# Patient Record
Sex: Female | Born: 1980 | Race: White | Hispanic: No | Marital: Married | Smoking: Never smoker
Health system: Southern US, Community
[De-identification: ages and names within clinical notes are randomized; demographics above are authoritative.]

## PROBLEM LIST (undated history)

## (undated) DIAGNOSIS — R5383 Other fatigue: Secondary | ICD-10-CM

## (undated) DIAGNOSIS — U071 COVID-19: Secondary | ICD-10-CM

## (undated) DIAGNOSIS — L309 Dermatitis, unspecified: Secondary | ICD-10-CM

## (undated) DIAGNOSIS — E063 Autoimmune thyroiditis: Secondary | ICD-10-CM

## (undated) DIAGNOSIS — T7840XA Allergy, unspecified, initial encounter: Secondary | ICD-10-CM

## (undated) DIAGNOSIS — G43909 Migraine, unspecified, not intractable, without status migrainosus: Secondary | ICD-10-CM

## (undated) HISTORY — DX: Migraine, unspecified, not intractable, without status migrainosus: G43.909

## (undated) HISTORY — PX: BREAST SURGERY: SHX581

## (undated) HISTORY — DX: Other fatigue: R53.83

## (undated) HISTORY — DX: Dermatitis, unspecified: L30.9

## (undated) HISTORY — DX: Allergy, unspecified, initial encounter: T78.40XA

## (undated) HISTORY — DX: COVID-19: U07.1

## (undated) HISTORY — DX: Autoimmune thyroiditis: E06.3

---
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
## 2008-01-17 ENCOUNTER — Inpatient Hospital Stay (HOSPITAL_COMMUNITY): Admission: AD | Admit: 2008-01-17 | Discharge: 2008-01-18 | Payer: Self-pay | Admitting: Obstetrics and Gynecology

## 2008-12-13 HISTORY — PX: MOLE REMOVAL: SHX2046

## 2008-12-13 HISTORY — PX: OTHER SURGICAL HISTORY: SHX169

## 2011-02-03 ENCOUNTER — Ambulatory Visit (INDEPENDENT_AMBULATORY_CARE_PROVIDER_SITE_OTHER): Payer: BC Managed Care – PPO | Admitting: "Endocrinology

## 2011-02-03 DIAGNOSIS — E063 Autoimmune thyroiditis: Secondary | ICD-10-CM

## 2011-02-03 DIAGNOSIS — E038 Other specified hypothyroidism: Secondary | ICD-10-CM

## 2011-02-03 DIAGNOSIS — E049 Nontoxic goiter, unspecified: Secondary | ICD-10-CM

## 2011-04-07 ENCOUNTER — Encounter: Payer: Self-pay | Admitting: *Deleted

## 2011-04-07 ENCOUNTER — Other Ambulatory Visit: Payer: Self-pay | Admitting: *Deleted

## 2011-04-07 DIAGNOSIS — E038 Other specified hypothyroidism: Secondary | ICD-10-CM

## 2011-04-27 NOTE — H&P (Signed)
NAME:  FELISHA, CLAYTOR    ACCOUNT NO.:  [id number]   MEDICAL RECORD NO.:  [id number]          PATIENT TYPE:  MAT   LOCATION:  [facility]                          FACILITY:  [facility]   PHYSICIAN:  Osborn Coho, M.D.   DATE OF BIRTH:  [birth year]   DATE OF ADMISSION:  01/17/2008  DATE OF DISCHARGE:                              HISTORY & PHYSICAL   Ms. Sharline Lehane is a 30 year old married white female, gravida 2,  para 1-0-0-1, at 40-0/7 weeks today, who presents with regular uterine  contractions every 5-8 minutes.  She denies leaking or bleeding.  No  signs or symptoms of PIH.  Her pregnancy has been followed by the  [facility] certified nurse midwife service and has been  remarkable for:   1. History of rapid labor.  2. Migraines.  3. History of hyperemesis.  4. Group B strep positive.   Her prenatal labs were collected on June 20, 2007.  Hemoglobin 12.0,  hematocrit 36.4, platelets 177,000.  Blood type A positive, antibody  negative.  RPR nonreactive.  Rubella immune.  Hepatitis B surface  antigen negative.  HIV nonreactive.  Pap smear within normal limits.  Cystic fibrosis negative.  Parvo titer is negative.  One-hour Glucola  from October 27, 2007, was 125.  RPR at that time was nonreactive.  Hemoglobin at that time was 11.5.  Culture of the vaginal tract for  group B strep on December 26, 2007, was positive.   HISTORY OF PRESENT PREGNANCY:  The patient presented for care at [facility] on June 20, 2007, at 10 weeks' gestation.  She declined first  trimester screen.  Anatomy ultrasound at 19-1/2 weeks' gestation shows  growth consistent with previous dating, confirming [facility] of January 17, 2008.  She has remained size equal to dates and normotensive throughout  the pregnancy.  The rest of her prenatal care was unremarkable.   OBSTETRICAL HISTORY:  She is a gravida 2, para 1-0-0-1.  In December  2006 she had a vaginal delivery of a female infant weighing 7  pounds 6  ounces at 56 weeks' gestation after 6 hours of labor.  She had no  anesthesia.  There were no complications.  Infant's name was [name].  The  second pregnancy is the current pregnancy.   PAST MEDICAL HISTORY:  She is allergic to CODEINE, resulting in  vomiting.   She experienced menarche at the age of 39 with 30-day cycles up to 4-5  days.  She had hyperemesis with her first pregnancy.  She reports having  had the usual childhood illnesses.  She has a history of migraines.   SURGICAL HISTORY:  Remarkable for wisdom teeth extraction in 2000.   FAMILY MEDICAL HISTORY:  Multiple grandparents with heart disease.  Sister and father with chronic hypertension.  Maternal grandmother with  varicosities.  All grandparents with insulin-dependent diabetes.   GENETIC HISTORY:  Negative.   SOCIAL HISTORY:  The patient is married to the father of the baby.  His  name is Best boy.  He is involved and supportive.  They are of the [place]  faith.  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  Father of the baby has 22 years of education and is a Surveyor, minerals  and a Engineer, production.  They deny any alcohol, tobacco or illicit drug  use with the pregnancy.   OBJECTIVE DATA:  VITAL SIGNS:  Stable.  She is afebrile.  HEENT:  Grossly within normal limits.  CHEST:  Clear to auscultation.  HEART:  Regular rate and rhythm.  ABDOMEN:  Gravid in contour with fundal height extending approximately  39 cm from the pubic symphysis.  Fetal heart rate is reactive and  reassuring with occasional variables.  Contractions are every 5-8  minutes lasting 60 seconds.  Cervix is 5 cm, 80% effaced, vertex -2, intact membranes.  She was 4 cm  on February 3 in the office.  EXTREMITIES:  Normal.   ASSESSMENT:  1. Intrauterine pregnancy at term.  2. Early active labor.  3. Group B strep positive.   PLAN:  1. Admit to birthing suite.  2. Routine CNM orders.  3. She declines pain  medication for now.  4. We will begin penicillin for group B strep.  5. Anticipate normal spontaneous vaginal birth.      Cam Hai, C.N.M.      Osborn Coho, M.D.  Electronically Signed    KS/MEDQ  D:  01/17/2008  T:  01/17/2008  Job:  [id number]

## 2011-05-18 ENCOUNTER — Encounter: Payer: Self-pay | Admitting: "Endocrinology

## 2011-05-18 ENCOUNTER — Ambulatory Visit (INDEPENDENT_AMBULATORY_CARE_PROVIDER_SITE_OTHER): Payer: BC Managed Care – PPO | Admitting: "Endocrinology

## 2011-05-18 VITALS — BP 116/67 | HR 76 | Wt 155.2 lb

## 2011-05-18 DIAGNOSIS — E038 Other specified hypothyroidism: Secondary | ICD-10-CM

## 2011-05-18 DIAGNOSIS — G43909 Migraine, unspecified, not intractable, without status migrainosus: Secondary | ICD-10-CM | POA: Insufficient documentation

## 2011-05-18 DIAGNOSIS — R5383 Other fatigue: Secondary | ICD-10-CM | POA: Insufficient documentation

## 2011-05-18 DIAGNOSIS — R231 Pallor: Secondary | ICD-10-CM

## 2011-05-18 DIAGNOSIS — R5381 Other malaise: Secondary | ICD-10-CM

## 2011-05-18 DIAGNOSIS — E063 Autoimmune thyroiditis: Secondary | ICD-10-CM | POA: Insufficient documentation

## 2011-05-18 LAB — CBC
MCHC: 33.7 g/dL (ref 30.0–36.0)
Platelets: 168 10*3/uL (ref 150–400)
RDW: 13.2 % (ref 11.5–15.5)
WBC: 4.6 10*3/uL (ref 4.0–10.5)

## 2011-05-18 LAB — T3, FREE: T3, Free: 2.9 pg/mL (ref 2.3–4.2)

## 2011-05-18 LAB — IRON: Iron: 104 ug/dL (ref 42–145)

## 2011-05-18 NOTE — Progress Notes (Addendum)
CC: FU of hypothyroidism, secondary to Hashimoto's thyroiditis, goiter  HPI: 30 y.o. Caucasian woman 1. Nancy [name] was diagnosed with hypothyroidism about September 2009, 6-7 months after the birth of her second child. She was started on levothyroixine. When I saw her son on 01.06.12 for treatment of his T1DM and hypothyroidism secondary to H.S., I asked her how she was doing. She was cold, tired, and just not feeling well. I changed her to brand Synthroid at the same dose of 50 mcg per day. When her TFTs that day came back low, I increased her dose to 50 mcg on even days and 75 mcg on odd days. OOn 02.22.12 I saw her as a new patient. She felt better on Synthroid, but was still cold. She had a 20+ gram gland. The remainder of her exam was normal. 2. Interim Hx: Even though we had increased her Synthroid to 50 mcg on even days and 75 mcg on odd days, her lab tests on 03.23.12 showed that she was more hypothyroid. I increased her Synthroid to 75 mcg per day on 03.26.12. 3. PROS: Constitutional. The patient feels better. She is well, is healthy overall, and has no significant complaints that pertain to today's visit. Energy: Energy level is pretty good overall. Sleep: The patient usually sleeps well when her mommy duties allow it.. There are no significant complaints of insomnia, frequent awakening, unusual restlessness, or poor sleep quality.  Body temperature: The patient's body temperature seems to be normal overall. She is not cold in 80 degree temperature surroundings. There are no significant problems with being warmer or colder than others in the same environment. Weight: Weight has remained stable. Thereare no significant problems with unusual weight gain or loss. Eyes: The patient's vision is good. There are no signproblems with soreness, bulging, or limited range of eye movements. Neck: The patient is not aware of any problems relating to the anterior neck and thyroid bed. There have  been no significant problems swelling, pain, soreness, tenderness, pressure, discomfort, or difficulty swallowing. Heart: The patient feels the expected increase in heart rate during exercise or other physical activities. There have been no significant problems with palpitations, irregular heart beats, chest pain, or chest pressure. Gastrointestinal: Stomach and intestines seem to be working normally. Bbwel movements are normal. There are no significant complaints of excessive hunger, acid reflux, upset stomach, stomach aches or pains, diarrhea, or constipation. Musculoskeletal: Muscles and extremities appear to be working normally. There are no significant problems with hand tremor, sweaty palms, palmar erythema, or lower leg swelling. Psychological: Mood and psychologicalal responses seem to be normal. There have been no significant problems with sadness, depression, irritability, anger, or inappropriate responses to the actions of others. Mental: The patient has not had any significant problems with abilities to think, to pay attention, to remember, and to make decisions.      GYN: LMP 05.22.12. Regular cycles.  PMFSH:Reviewed. No changes.  ROS: Nancy [name] has no other significant issues involving her other eleven body systems.  PHYSICal exam: BP 116/67  Pulse 76  Wt 155 lb 3.2 oz (70.398 kg) Constitutional: The patient looks healthy and appears physically and emotionally well.  Eyes: There is no arcus or proptosis.  Mouth: The oropharynx appears normal. The tongue appears normal. There is normal oral moisture. There is no obvious gingivitis. Neck: There are no bruits present. The thyroid gland appears normal in size. The thyroid gland is approximately 20+ grams in size. The right lobe is  within normal limits. The left lobe is slightly enlarged, but smaller that no last visit. The consistency of the thyroid gland is normal. There is no thyroid tenderness to palpation. Lungs: The  lungs are clear. Air movement is good. Heart: The heart rhythm and rate appear normal. Heart sounds S1 and S2 are normal. I do not appreciate any pathologic heart murmurs. Abdomen: The abdominal size is normal. Bowel sounds are normal. The abdomen is soft and non-tender. There is no obviously palpable hepatomegaly, splenomegaly, or other masses.  Arms: Muscle mass appears appropriate for age. Hands: There is no obvious tremor. Phalangeal and metacarpophalangeal joints appear normal. Palms are cooll. Mild pallor of the fingers. Legs: Muscle mass appears appropriate for age. There is no edema.  Neurologic: Muscle strength is normal for age and gender  in both the upper and the lower extremities. Muscle tone appears normal. Sensation to touch is normal in the legs and feet.  ASSESSMENT: 1. Hypothyroid: Patient appears better clinically today. She has been losing thyrocytes at about the rate we are replacing Synthroid, or perhaps a little faster. 2. Throiditis: Clinically quiescent, but intermittently active. 3. Fatigue: Better 4. Pallor: May well have iron deficiency and/or be anemic.  Plan:  1. TFTs, CBC, iron 2. Contact patient with results. 3. Increase Synthroid dose as needed. 4. FU in four months.   Level of Service: This visit lasted in excess of 40 minutes. More than 50% of the visit was devoted to counseling.

## 2011-05-18 NOTE — Patient Instructions (Signed)
Please make follow-up appointment in 4 months.

## 2011-09-01 ENCOUNTER — Other Ambulatory Visit: Payer: Self-pay | Admitting: "Endocrinology

## 2011-09-01 ENCOUNTER — Other Ambulatory Visit: Payer: Self-pay | Admitting: *Deleted

## 2011-09-01 DIAGNOSIS — E038 Other specified hypothyroidism: Secondary | ICD-10-CM

## 2011-09-01 LAB — CLIENT PROFILE 3332: Free T4: 1.19 ng/dL (ref 0.80–1.80)

## 2011-09-03 LAB — CBC
Hemoglobin: 12
MCHC: 34.6
MCV: 90.1
Platelets: 144 — ABNORMAL LOW
Platelets: 149 — ABNORMAL LOW
RDW: 14.4
WBC: 14.4 — ABNORMAL HIGH

## 2011-09-20 ENCOUNTER — Ambulatory Visit: Payer: BC Managed Care – PPO | Admitting: "Endocrinology

## 2011-09-22 ENCOUNTER — Encounter: Payer: Self-pay | Admitting: "Endocrinology

## 2011-09-22 ENCOUNTER — Ambulatory Visit (INDEPENDENT_AMBULATORY_CARE_PROVIDER_SITE_OTHER): Payer: Commercial Managed Care - PPO | Admitting: "Endocrinology

## 2011-09-22 VITALS — BP 111/71 | HR 76 | Wt 158.0 lb

## 2011-09-22 DIAGNOSIS — E063 Autoimmune thyroiditis: Secondary | ICD-10-CM

## 2011-09-22 DIAGNOSIS — R231 Pallor: Secondary | ICD-10-CM

## 2011-09-22 DIAGNOSIS — E038 Other specified hypothyroidism: Secondary | ICD-10-CM

## 2011-09-22 DIAGNOSIS — E049 Nontoxic goiter, unspecified: Secondary | ICD-10-CM

## 2011-09-22 NOTE — Progress Notes (Addendum)
CC: FU of hypothyroidism, secondary to Hashimoto's thyroiditis  HPI: 30 y.o. Caucasian woman 1. [name] was diagnosed with hypothyroidism due to Hashimoto's Thyroiditis about September 2009, 6-7 months after the birth of her second child. She was started on levothyroxine. On 01/14/11 I saw her as a new patient. She felt better on Synthroid, but was still cold. She had a 20+ gram gland. The remainder of her exam was normal. 2. Interim Hx:We have gradually increased her dose of Synthroid from 50 mcg/day to 75 mcg/day. Her symptoms of coldness and fatigue have resolved. Her last clinic visit was on 05/18/11. In the interim she has felt well.  3. PROS: Constitutional. The patient feels good. She is well, is healthy overall, and has no significant complaints that pertain to today's visit. Energy: Energy level is pretty good overall. Sleep: The patient usually sleeps well. There are no significant complaints of insomnia, frequent awakening, unusual restlessness, or poor sleep quality.  Body temperature: The patient's body temperature seems to be normal overall. She is not cold in 80 degree temperature surroundings. There are no significant problems with being warmer or colder than others in the same environment. Weight: Weight has remained stable. There are no significant problems with unusual weight gain or loss. Eyes: The patient's vision is good. There are no significant problems with soreness, bulging, or limited range of eye movements. Neck: The patient is not aware of any problems relating to the anterior neck and thyroid bed. There have been no significant problems swelling, pain, soreness, tenderness, pressure, discomfort, or difficulty swallowing. Heart: The patient feels the expected increase in heart rate during exercise or other physical activities. There have been no significant problems with palpitations, irregular heart beats, chest pain, or chest pressure. Gastrointestinal:  Stomach and intestines seem to be working normally. Bowel movements are normal. There are no significant complaints of excessive hunger, acid reflux, upset stomach, stomach aches or pains, diarrhea, or constipation. Musculoskeletal: Muscles and extremities appear to be working normally. There are no significant problems with hand tremor, sweaty palms, palmar erythema, or lower leg swelling. Psychological: Mood and psychological responses seem to be normal. There have been no significant problems with sadness, depression, irritability, anger, or inappropriate responses to the actions of others. Mental: The patient has not had any significant problems with abilities to think, to pay attention, to remember, and to make decisions.      GYN: LMP 09/19/11. She is having regular cycles.  PMFSH: 1. Family and Work: She is still at home taking care of her children. She is now actively looking for work. 2. Activities: No formal exercise program. 3. Tobacco, alcohol, and drugs: None 4. PCP: Dr. [name]  ROS: [name] has no other significant issues involving her other body systems.  PHYSICAL EXAM: BP 111/71  Pulse 76  Wt 158 lb (71.668 kg) Gain of 3 pounds Constitutional: The patient looks healthy and appears physically and emotionally well.  Eyes: There is no arcus or proptosis.  Mouth: The oropharynx appears normal. The tongue appears normal. There is normal oral moisture. There is no obvious gingivitis. Neck: There are no bruits present. The thyroid gland appears normal in size. The thyroid gland is approximately 20+ grams in size. The right lobe is within normal limits. The left lobe is slightly enlarged, essentially unchanged from last visit. The consistency of the thyroid gland is normal. There is no thyroid tenderness to palpation. Lungs: The lungs are clear. Air movement is good.  Heart: The heart rhythm and rate appear normal. Heart sounds S1 and S2 are normal. I do not  appreciate any pathologic heart murmurs. Abdomen: The abdominal size is normal. Bowel sounds are normal. The abdomen is soft and non-tender. There is no obviously palpable hepatomegaly, splenomegaly, or other masses.  Arms: Muscle mass appears appropriate for age.  Hands: There is no obvious tremor. Phalangeal and metacarpophalangeal joints appear normal. Palms are cool. Mild pallor of the fingers, but resolves with flexing of the fingers. Legs: Muscle mass appears appropriate for age. There is no edema.  Neurologic: Muscle strength is normal for age and gender  in both the upper and the lower extremities. Muscle tone appears normal. Sensation to touch is normal in the legs.  ASSESSMENT: 1. Hypothyroid: Patient appears better clinically today. She has been losing thyrocytes at about the rate we are replacing Synthroid, or perhaps a little faster. 2. Thyroiditis: Clinically quiescent, but intermittently active. 3. Goiter: Essentially unchanged from last visit. 4. Pallor: This appears to be more of a positional issue, not anemia per se.  Plan:  1. Diagnostic: TFTs in 3 and 6 months. 2. Therapeutic: Continue current dose of synthroid. 3. Patient education: We discussed the fact that she is gradually losing thyroid cells over time. Therefore we will need to check her TFTs every 3-6 months and increase the dose of Synthroid as needed. 4. Follow-up: FU in four months.   Level of Service: This visit lasted in excess of 40 minutes. More than 50% of the visit was devoted to counseling.

## 2011-09-22 NOTE — Patient Instructions (Signed)
Followup visit in 6 months. Please have a thyroid test repeated in early January and again about one week prior to next appointment.

## 2011-12-02 ENCOUNTER — Encounter (HOSPITAL_COMMUNITY): Payer: Self-pay | Admitting: Emergency Medicine

## 2011-12-02 ENCOUNTER — Emergency Department (HOSPITAL_COMMUNITY)
Admission: EM | Admit: 2011-12-02 | Discharge: 2011-12-02 | Disposition: A | Discharge status: Home / Self Care | Payer: 59 | Source: Home / Self Care | Attending: Family Medicine | Admitting: Family Medicine

## 2011-12-02 ENCOUNTER — Emergency Department (INDEPENDENT_AMBULATORY_CARE_PROVIDER_SITE_OTHER): Payer: 59

## 2011-12-02 DIAGNOSIS — M940 Chondrocostal junction syndrome [Tietze]: Secondary | ICD-10-CM

## 2011-12-02 MED ORDER — ONDANSETRON HCL 4 MG PO TABS: 4.0000 mg | ORAL_TABLET | Freq: Three times a day (TID) | ORAL | Status: AC | PRN

## 2011-12-02 MED ORDER — HYDROCODONE-ACETAMINOPHEN 5-325 MG PO TABS: ORAL_TABLET | ORAL | Status: AC

## 2011-12-02 NOTE — ED Provider Notes (Signed)
History     CSN: [id number]  Arrival date & time 12/02/11  1100   First MD Initiated Contact with Patient 12/02/11 1331      Chief Complaint  Patient presents with  . Flank Pain  . Shortness of Breath    (Consider location/radiation/quality/duration/timing/severity/associated sxs/prior treatment) HPI Comments: Nancy [name] presents for evaluation of LEFT sided posterior rib pain that started last evening after a large coughing spell. She reports coughing over the last few weeks and was evaluated at onset and dx with sinus infection/URI. Coughing has persisted. She reports a large cough last night and immediate pain, to where she now has difficulty taking deep breaths.   Patient is a 30 y.o. female presenting with chest pain. The history is provided by the patient and the spouse.  Chest Pain The chest pain began 1 - 2 weeks ago. Chest pain occurs constantly. The chest pain is unchanged. The pain is associated with breathing, coughing, lifting and exertion. The quality of the pain is described as sharp and pleuritic. The pain does not radiate. Chest pain is worsened by deep breathing and exertion. Primary symptoms include cough. Pertinent negatives for primary symptoms include no fever, no shortness of breath and no wheezing.     Past Medical History  Diagnosis Date  . Hypothyroidism, acquired, autoimmune   . Thyroiditis, autoimmune   . Fatigue   . Eczema   . Migraines     Past Surgical History  Procedure Date  . Mole removal   . Tongue growth     Family History  Problem Relation Age of Onset  . Diabetes Son   . Hypothyroidism Son   . Stroke Maternal Grandmother   . Heart disease Maternal Grandfather     History  Substance Use Topics  . Smoking status: Never Smoker   . Smokeless tobacco: Not on file  . Alcohol Use: No    OB History    Grav Para Term Preterm Abortions TAB SAB Ect Mult Living                  Review of Systems  Constitutional: Negative for fever  and chills.  HENT: Negative for ear pain, congestion, sore throat, rhinorrhea, sneezing and trouble swallowing.   Eyes: Negative.   Respiratory: Positive for cough. Negative for shortness of breath and wheezing.   Cardiovascular: Positive for chest pain.  Gastrointestinal: Negative.   Genitourinary: Negative.   Musculoskeletal:       Rib pain  Skin: Negative.     Allergies  Codeine  Home Medications   Current Outpatient Rx  Name Route Sig Dispense Refill  . LEVOTHYROXINE SODIUM 50 MCG PO TABS Oral Take 75 mcg by mouth daily.  Brand Name Synthroid Only      BP 122/69  Pulse 84  Temp(Src) 97.8 F (36.6 C) (Oral)  Resp 16  SpO2 100%  LMP 11/15/2011  Physical Exam  Nursing note and vitals reviewed. Constitutional: She is oriented to person, place, and time. She appears well-developed and well-nourished.  HENT:  Head: Normocephalic and atraumatic.  Eyes: EOM are normal.  Neck: Normal range of motion.  Pulmonary/Chest: Effort normal. She exhibits tenderness.    Musculoskeletal: Normal range of motion.  Neurological: She is alert and oriented to person, place, and time.  Skin: Skin is warm and dry.  Psychiatric: Her behavior is normal.    ED Course  Procedures (including critical care time)  Labs Reviewed - No data to display No results found.  1. Costochondritis       MDM          Richardo Priest, MD 12/16/11 2054

## 2011-12-02 NOTE — ED Notes (Signed)
PT HERE WITH C/O LEFT RIBCAGE PAIN AND OCASSIONALLY SOB WITH DEEP INHALATION THAT STARTED 2WKS AGO POST INCREASED DRY COUGHING SPELLS THAT HAS EASED UP.PT STATES THE PAIN FLARED UP ON LEFT SIDE WITH DULL,ACHY PAIN THAT WORSENS TO SHARP PAIN WITH MOVEMENTS.PT HAS WRAP WITH ACE WRAP AND TOOK TYLENOL FOR PAIN.

## 2011-12-28 LAB — T3, FREE: T3, Free: 2.8 pg/mL (ref 2.3–4.2)

## 2012-03-20 ENCOUNTER — Other Ambulatory Visit: Payer: Self-pay | Admitting: "Endocrinology

## 2012-03-21 LAB — T4, FREE: Free T4: 1.14 ng/dL (ref 0.80–1.80)

## 2012-03-21 LAB — T3, FREE: T3, Free: 3.1 pg/mL (ref 2.3–4.2)

## 2012-03-22 ENCOUNTER — Ambulatory Visit (INDEPENDENT_AMBULATORY_CARE_PROVIDER_SITE_OTHER): Payer: 59 | Admitting: "Endocrinology

## 2012-03-22 ENCOUNTER — Encounter: Payer: Self-pay | Admitting: "Endocrinology

## 2012-03-22 VITALS — BP 119/70 | HR 76 | Wt 162.4 lb

## 2012-03-22 DIAGNOSIS — R231 Pallor: Secondary | ICD-10-CM

## 2012-03-22 DIAGNOSIS — E038 Other specified hypothyroidism: Secondary | ICD-10-CM

## 2012-03-22 DIAGNOSIS — E063 Autoimmune thyroiditis: Secondary | ICD-10-CM

## 2012-03-22 DIAGNOSIS — E049 Nontoxic goiter, unspecified: Secondary | ICD-10-CM

## 2012-03-22 MED ORDER — LEVOTHYROXINE SODIUM 50 MCG PO TABS: ORAL_TABLET | ORAL | Status: DC

## 2012-03-22 NOTE — Patient Instructions (Signed)
Follow-up visit in 6 months. Please change your Synthroid dosage as follow. Take one 75 mcg tablet 5 days per week. Take 1.5 of the 75 mcg tablets twice weekly, for example, on Wednesday and Sunday.

## 2012-03-22 NOTE — Progress Notes (Addendum)
CC: FU of hypothyroidism, secondary to Hashimoto's thyroiditis  HPI: 31 y.o. Caucasian woman 1. Nancy [name] was diagnosed with hypothyroidism due to Hashimoto's Thyroiditis about September 2009, 6-7 months after the birth of her second child. She was started on levothyroxine. On 02.22.12 I saw her as a new patient. She felt better on Synthroid, but was still cold. She had a 20+ gram gland. The remainder of her exam was normal. 2. Interim Hx: We have gradually increased her dose of Synthroid from 50 mcg/day to 75 mcg/day. Her symptoms of coldness and fatigue have resolved. Her last clinic visit was on 09/22/11. In the interim she has been a little more tired, but she is working pretty hard as a wife and mother.   3. PROS: Constitutional. The patient feels somewhat tired as above. She is well, is healthy overall, and has no significant complaints that pertain to today's visit. Energy: Energy level is moderate to pretty good, depending upon the day of the week.  Sleep: The patient usually sleeps well. There are no significant complaints of insomnia, frequent awakening, unusual restlessness, or poor sleep quality.  Body temperature: The patient's body temperature seems to be normal overall. She and her son tend to be colder than her husband and her daughter. Weight: Weight has increased another 4 pounds. Eyes: The patient's vision is good. There are no significant problems with soreness, bulging, or limited range of eye movements. Neck: The patient is not aware of any problems relating to the anterior neck and thyroid bed. There have been no significant problems swelling, pain, soreness, tenderness, pressure, discomfort, or difficulty swallowing. Heart: The patient feels the expected increase in heart rate during exercise or other physical activities. There have been no significant problems with palpitations, irregular heart beats, chest pain, or chest pressure. Gastrointestinal: Stomach and  intestines seem to be working normally. Bowel movements tend to be a bit sluggish. There are no significant complaints of excessive hunger, acid reflux, upset stomach, stomach aches or pains, or diarrhea. Musculoskeletal: Muscles and extremities appear to be working normally. There are no significant problems with hand tremor, sweaty palms, palmar erythema, or lower leg swelling. Psychological: Mood and psychological responses seem to be normal. There have been no significant problems with sadness, depression, irritability, anger, or inappropriate responses to the actions of others. Mental: The patient has not had any significant problems with abilities to think, to pay attention, to remember, and to make decisions.      GYN: LMP 03/06/11. She is having regular cycles.  PMFSH: 1. Family and Work: She is still at home taking care of her children. She does substitute teaching occasionally.  2. Activities: She does 30 minutes on the elliptical 5-6 days per week, plus walking the dog for about 30 minutes almost every day. She is building more muscle. Her clothes are fitting more loosely. 3. Tobacco, alcohol, and drugs: None 4. PCP: Dr. [name]  ROS: Ms. adiya selmer has no other significant issues involving her other body systems.  PHYSICAL EXAM: BP 119/70  Pulse 76  Wt 162 lb 6.4 oz (73.664 kg) Gain of 4 pounds Constitutional: The patient looks healthy and appears physically and emotionally well. Her face looks thinner.  Eyes: There is no arcus or proptosis.  Mouth: The oropharynx appears normal. The tongue appears normal. There is normal oral moisture. There is no obvious gingivitis. Neck: There are no bruits present. The thyroid gland appears normal in size. The thyroid gland is approximately 20+  grams in size. The right lobe is within normal limits. The left lobe is slightly enlarged, essentially unchanged from last visit. The consistency of the thyroid gland is normal. There is no  thyroid tenderness to palpation. Lungs: The lungs are clear. Air movement is good. Heart: The heart rhythm and rate appear normal. Heart sounds S1 and S2 are normal. I do not appreciate any pathologic heart murmurs. Abdomen: The abdominal size is normal. Bowel sounds are normal. The abdomen is soft and non-tender. There is no obviously palpable hepatomegaly, splenomegaly, or other masses.  Arms: Muscle mass appears appropriate for age.  Hands: There is no obvious tremor. Phalangeal and metacarpophalangeal joints appear normal. Palms are cool. Mild pallor of the fingers, but resolves with flexing of the fingers. Legs: Muscle mass appears appropriate for age. There is no edema.  Neurologic: Muscle strength is normal for age and gender  in both the upper and the lower extremities. Muscle tone appears normal. Sensation to touch is normal in the legs.  LAB DATA: 03/20/12: TSH 2.980, free T4 1.14, free T3 3.1  ASSESSMENT: 1. Hypothyroid: Patient appears to be mildly hypothyroid today, although her TFTs are still technically WNL. Her TSH is higher than in January, her free T4 is lower, and her free T3 is higher, c/w increased TSH effect to increase conversion of T4 to T3. She may benefit from a small increase in Synthroid.  2. Thyroiditis: Clinically quiescent, but intermittently active. 3. Goiter: Essentially unchanged from last visit. 4. Pallor: This appears to be more of a positional issue, not anemia per se.  Plan:  1. Diagnostic: TFTs in 3 and 6 months. 2. Therapeutic: Change her dose of Synthroid 75 mcg to 75 mcg 5 days per week and 112.5 mcg (1-1/2 75 mcg tablets) on Wednesday and Sunday.  3. Patient education: We discussed the fact that she is gradually losing thyroid cells over time. Therefore we will need to check her TFTs every 3-6 months and increase the dose of Synthroid as needed. 4. Follow-up: FU in six months.   Level of Service: This visit lasted in excess of 40 minutes. More than 50%  of the visit was devoted to counseling.  David Stall

## 2012-06-06 ENCOUNTER — Other Ambulatory Visit: Payer: Self-pay | Admitting: *Deleted

## 2012-06-06 DIAGNOSIS — E038 Other specified hypothyroidism: Secondary | ICD-10-CM

## 2012-06-22 LAB — T3, FREE: T3, Free: 2.6 pg/mL (ref 2.3–4.2)

## 2012-07-07 ENCOUNTER — Other Ambulatory Visit: Payer: Self-pay | Admitting: *Deleted

## 2012-07-07 DIAGNOSIS — E039 Hypothyroidism, unspecified: Secondary | ICD-10-CM

## 2012-08-29 ENCOUNTER — Other Ambulatory Visit: Payer: Self-pay | Admitting: *Deleted

## 2012-08-29 DIAGNOSIS — E063 Autoimmune thyroiditis: Secondary | ICD-10-CM

## 2012-08-29 MED ORDER — LEVOTHYROXINE SODIUM 50 MCG PO TABS: ORAL_TABLET | ORAL | Status: DC

## 2012-09-04 ENCOUNTER — Telehealth: Payer: Self-pay | Admitting: *Deleted

## 2012-09-04 NOTE — Telephone Encounter (Signed)
I e-scribed RX for patient's brand name Synthroid to [facility] on 08/29/12.  While I was away last week, I received a fax from [facility] stating that they are unable to verify eligibility of this pt.  They request I contact pt to get up to date information and resubmit e-scribed RX.  I called Nancy [name] and discussed the problem.  She will contact [facility] tomorrow to update whatever information they need, then let me know.  I will then resubmit the RX.

## 2012-09-06 ENCOUNTER — Other Ambulatory Visit: Payer: Self-pay | Admitting: *Deleted

## 2012-09-06 DIAGNOSIS — E063 Autoimmune thyroiditis: Secondary | ICD-10-CM

## 2012-09-06 MED ORDER — LEVOTHYROXINE SODIUM 75 MCG PO TABS: ORAL_TABLET | ORAL | Status: DC

## 2012-09-07 ENCOUNTER — Telehealth: Payer: Self-pay | Admitting: *Deleted

## 2012-09-07 NOTE — Telephone Encounter (Signed)
Pt's insurance is understand her husband [place]. Earlier this week I received a fax back from [facility] re an e-scribed RX for Synthoid 75 mcg.  Fax stated that they were unable to verify eligibility of pt.  Olaf let me know yesterday that problem was fixed and I could resend the RX via e-scribe.  I did.  This AM I received the exact same fax back as before stating [facility] was unable to verify eligibility of pt.  I left Olaf a voice mail asking him to contact [facility] again to fix the problem and let me know when I can resend RX.

## 2012-09-21 ENCOUNTER — Encounter: Payer: Self-pay | Admitting: "Endocrinology

## 2012-09-21 ENCOUNTER — Ambulatory Visit (INDEPENDENT_AMBULATORY_CARE_PROVIDER_SITE_OTHER): Payer: BC Managed Care – PPO | Admitting: "Endocrinology

## 2012-09-21 ENCOUNTER — Telehealth: Payer: Self-pay | Admitting: *Deleted

## 2012-09-21 VITALS — BP 125/70 | HR 87 | Wt 164.4 lb

## 2012-09-21 DIAGNOSIS — E063 Autoimmune thyroiditis: Secondary | ICD-10-CM

## 2012-09-21 DIAGNOSIS — E038 Other specified hypothyroidism: Secondary | ICD-10-CM

## 2012-09-21 DIAGNOSIS — R231 Pallor: Secondary | ICD-10-CM

## 2012-09-21 DIAGNOSIS — E049 Nontoxic goiter, unspecified: Secondary | ICD-10-CM

## 2012-09-21 NOTE — Telephone Encounter (Signed)
Left Voice Mail for Nancy [name] and Nancy [name] on home line. Placed call to Nancy [name]'s cell.  Needed to speak with them about the voice mail I left for Nancy [name] approximately 1.5 weeks ago.  I has e-scribed RX's for [employer] as they requested to [facility]. They came back to me stating they were unable to verify their benefits in the [facility] system.  I spoke with Nancy [name]. He spoke with [facility] and straightened everything out.  He left me a voice mail to resend RX's electronically, which I did.  The next day I again got the RX's returned from [facility] with the same message. I called and Left a voice mail on Nancy [name]'s cell.  I am following up today to see if he spoke a second time with [facility] and what I need to do now. Per Nancy [name], he doesn't recall getting my voice mail, but will contact his company insurance case manager and request she follow-up with [facility]. Nancy [name] received an insurance/pharmacy card today with the same numbers on it as his [facility] Card had prior to his switching jobs and insurance.  They have received some insulin and meds from [facility], but Nancy [name]'s Synthroid wasn't amongst it. Nancy [name] will leave me a voice mail as to what needs to be done next, what RX's need to re-scribed and where I need to fax RX's for Nancy [name]'s insulin supplies.

## 2012-09-21 NOTE — Progress Notes (Signed)
CC: FU of hypothyroidism, secondary to Hashimoto's thyroiditis  HPI: 31 y.o. Caucasian woman 1. Nancy [name] was diagnosed with hypothyroidism due to Hashimoto's Thyroiditis about September 2009, 6-7 months after the birth of her second child. She was started on levothyroxine. On 02.22.12 I saw her as a new patient. She felt better on Synthroid, but was still cold. She had a 20+ gram gland. The remainder of her exam was normal. 2. During the past year we have gradually increased her dose of Synthroid to 75 mcg/day 5 days per week and 112.5 mcg (1.5 of the 75 mcg pills) on Wednesday and Sunday. Her symptoms of coldness and fatigue have improved.  3. Her last clinic visit was on 03/22/12. In the interim she has been healthy. She has been getting more migraines, about 5-6 per month. Some are related to ovulation and menses. She is not on birth control pills.  Due to problems with [facility] she has been out of Synthroid for a few days.   4. PROS: Constitutional. The patient feels "pretty good, a little tired, but not bad". She is healthy overall and has no significant complaints that pertain to today's visit. Energy: Energy level is down a little bit.   Sleep: The patient usually sleeps well. There are no significant complaints of insomnia, frequent awakening, unusual restlessness, or poor sleep quality.  Body temperature: The patient's body temperature tends to be colder, especially since running out of Synthroid.  Weight: Weight has increased another 2 pounds. Eyes: The patient's vision is good. There are no significant problems with soreness, bulging, or limited range of eye movements. Neck: The patient is not aware of any problems relating to the anterior neck and thyroid bed. There have been no significant problems swelling, pain, soreness, tenderness, pressure, discomfort, or difficulty swallowing. Heart: The patient feels the expected increase in heart rate during exercise or other  physical activities. There have been no significant problems with palpitations, irregular heart beats, chest pain, or chest pressure. Gastrointestinal: Stomach and intestines seem to be working normally. Bowel movements are normal. There are no significant complaints of excessive hunger, acid reflux, upset stomach, stomach aches or pains, diarrhea, or constipation. Musculoskeletal: Muscles and extremities appear to be working normally. There are no significant problems with hand tremor, sweaty palms, palmar erythema, or lower leg swelling. Psychological: Mood and psychological responses seem to be normal. There have been no significant problems with sadness, depression, irritability, anger, or inappropriate responses to the actions of others. Mental: The patient has not had any problems with abilities to think, to pay attention, to remember, and to make decisions.      GYN: LMP 09/20/12. She is having regular cycles.  PAST MEDICAL, FAMILY, AND SOCIAL HISTORY: 1. Family and Work: She is still at home taking care of her children. She does substitute teaching occasionally.  2. Activities: She has not been doing much exercise since school started.  3. Tobacco, alcohol, and drugs: None 4. PCP: Dr. [name]  REVIEW OF SYSTEMS: Nancy [name] has no other significant issues involving her other body systems.  PHYSICAL EXAM: BP 125/70  Pulse 87  Wt 164 lb 6.4 oz (74.571 kg) Gain of 2 pounds Constitutional: The patient looks healthy and appears physically and emotionally well. Her face looks thinner.  Eyes: There is no arcus or proptosis.  Mouth: The oropharynx appears normal. The tongue appears normal. There is normal oral moisture. There is no obvious gingivitis. Neck: There are no bruits  present. The thyroid gland appears normal in size. The thyroid gland is approximately 20+ grams in size. The right lobe is within normal limits. The left lobe is slightly enlarged, essentially unchanged  from last visit. The consistency of the thyroid gland is normal. There is no thyroid tenderness to palpation. Lungs: The lungs are clear. Air movement is good. Heart: The heart rhythm and rate appear normal. Heart sounds S1 and S2 are normal. I do not appreciate any pathologic heart murmurs. Abdomen: The abdominal size is normal. Bowel sounds are normal. The abdomen is soft and non-tender. There is no obviously palpable hepatomegaly, splenomegaly, or other masses.  Arms: Muscle mass appears appropriate for age.  Hands: There is no obvious tremor. Phalangeal and metacarpophalangeal joints appear normal. Palms are cool. She has mild pallor of the fingers that resolves with flexing of the fingers. Legs: Muscle mass appears appropriate for age. There is no edema.  Neurologic: Muscle strength is normal for age and gender  in both the upper and the lower extremities. Muscle tone appears normal. Sensation to touch is normal in the legs.  LAB DATA: 03/20/12: TSH 2.980, free T4 1.14, free T3 3.1  ASSESSMENT: 1. Hypothyroid: Patient appears to be mildly hypothyroid today, despite increasing her Synthroid dose at last visit. She has been out of Synthroid for several days.  2. Thyroiditis: Clinically quiescent, but intermittently active. 3. Goiter: Essentially unchanged from last visit. 4. Pallor: This appears to be more of a positional issue, not anemia per se.  Plan:  1. Diagnostic: TFTs in 3 and 6 months. 2. Therapeutic: Continue current dose of Synthroid: 75 mcg 5 days per week and 112.5 mcg (1.5 of the 75 mcg tablets) on Wednesday and Sunday. I will give her a scrip for Synthroid 75, 1.5 tabs daily in case we need to increase her Synthroid dose at next lab draw.  3. Patient education: We discussed the fact that she is gradually losing thyroid cells over time. Therefore we will need to check her TFTs every 3-6 months and increase the dose of Synthroid as needed. 4. Follow-up: FU in six months.   Level  of Service: This visit lasted in excess of 40 minutes. More than 50% of the visit was devoted to counseling.  David Stall

## 2012-09-21 NOTE — Patient Instructions (Signed)
Follow up visit in 6 months. Please obtain thyroid blood tests in 3 and 6 months.

## 2013-01-16 ENCOUNTER — Other Ambulatory Visit: Payer: Self-pay | Admitting: *Deleted

## 2013-01-16 DIAGNOSIS — E038 Other specified hypothyroidism: Secondary | ICD-10-CM

## 2013-01-22 ENCOUNTER — Other Ambulatory Visit: Payer: Self-pay | Admitting: *Deleted

## 2013-01-22 DIAGNOSIS — E063 Autoimmune thyroiditis: Secondary | ICD-10-CM

## 2013-01-22 LAB — TSH: TSH: 4.261 u[IU]/mL (ref 0.350–4.500)

## 2013-01-22 LAB — T3, FREE: T3, Free: 2.9 pg/mL (ref 2.3–4.2)

## 2013-01-22 LAB — T4, FREE: Free T4: 1.14 ng/dL (ref 0.80–1.80)

## 2013-01-22 MED ORDER — LEVOTHYROXINE SODIUM 75 MCG PO TABS: ORAL_TABLET | ORAL | Status: DC

## 2013-03-05 ENCOUNTER — Other Ambulatory Visit: Payer: Self-pay | Admitting: *Deleted

## 2013-03-05 DIAGNOSIS — E038 Other specified hypothyroidism: Secondary | ICD-10-CM

## 2013-03-28 ENCOUNTER — Encounter: Payer: Self-pay | Admitting: "Endocrinology

## 2013-03-28 ENCOUNTER — Ambulatory Visit (INDEPENDENT_AMBULATORY_CARE_PROVIDER_SITE_OTHER): Payer: BC Managed Care – PPO | Admitting: "Endocrinology

## 2013-03-28 VITALS — BP 124/78 | HR 82 | Wt 167.7 lb

## 2013-03-28 DIAGNOSIS — E038 Other specified hypothyroidism: Secondary | ICD-10-CM

## 2013-03-28 DIAGNOSIS — E049 Nontoxic goiter, unspecified: Secondary | ICD-10-CM

## 2013-03-28 DIAGNOSIS — E063 Autoimmune thyroiditis: Secondary | ICD-10-CM

## 2013-03-28 MED ORDER — LEVOTHYROXINE SODIUM 75 MCG PO TABS: ORAL_TABLET | ORAL | Status: DC

## 2013-03-28 NOTE — Patient Instructions (Signed)
Follow up visit in 6 months. Please have thyroid blood tests performed in 3 and 6 months.

## 2013-03-28 NOTE — Progress Notes (Signed)
CC: FU of hypothyroidism, secondary to Hashimoto's thyroiditis  HPI: 32 y.o. Caucasian woman 1. [name] was diagnosed with hypothyroidism due to Hashimoto's Thyroiditis about September 2009, 6-7 months after the birth of her second child. She was started on levothyroxine. On 02/03/11 I saw her as a new patient. She felt better on Synthroid, but was still cold. She had a 20+ gram gland. The remainder of her exam was normal. 2. During the past year we have gradually increased her dose of Synthroid to 75 mcg/day 2 days per week on  Wednesdays and Sundays and 112.5 mcg (1.5 of the 75 mcg pills) 5 days per week.  Her symptoms of coldness and fatigue have improved.  3. Her last clinic visit was on 09/21/12. In the interim she has been healthy, except for migraines. Dr. Katrinka Blazing put her on Topamax and the frequency of migraines has decreased by about 50%. She is not on birth control pills.  She remains on Synthroid, 75 mcg tabs, 1.5 pills 5 days per week, and 1.0 pills on Wednesdays and Sundays./day. Her son has been sick with encopresis and her mother was recently diagnosed with lung cancer.  4. PROS: Constitutional. The patient feels "good, a little tired". She is healthy overall and has no significant complaints that pertain to today's visit. Energy: Energy level is" low-normal".   Sleep: The patient usually sleeps well. There are no significant complaints of insomnia, frequent awakening, unusual restlessness, or poor sleep quality.  Body temperature: The patient's body temperature still tends to be colder.  Weight: Weight has increased another 2 pounds. Eyes: The patient's vision is good. There are no significant problems with soreness, bulging, or limited range of eye movements. Neck: The patient is not aware of any problems relating to the anterior neck and thyroid bed. There have been no significant problems swelling, pain, soreness, tenderness, pressure, discomfort, or difficulty  swallowing. Heart: The patient feels the expected increase in heart rate during exercise or other physical activities. There have been no significant problems with palpitations, irregular heart beats, chest pain, or chest pressure. Gastrointestinal: Stomach and intestines seem to be working normally. Bowel movements are normal. There are no significant complaints of excessive hunger, acid reflux, upset stomach, stomach aches or pains, diarrhea, or constipation. Musculoskeletal: Muscles and extremities appear to be working normally. There are no significant problems with hand tremor, sweaty palms, palmar erythema, or lower leg swelling. Psychological: Mood and psychological responses seem to be normal. There have been no significant problems with sadness, depression, irritability, anger, or inappropriate responses to the actions of others. Mental: The patient has not had any problems with abilities to think, to pay attention, to remember, and to make decisions.      GYN: LMP 03/02/13. She is having regular cycles. She is not on birth control. Her husband had a vasectomy.  PAST MEDICAL, FAMILY, AND SOCIAL HISTORY: 1. Family and Work: She is still at home taking care of her children. 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  2. Activities: She has not been doing much exercise since school started.  3. Tobacco, alcohol, and drugs: None 4. PCP: Dr. [name]  REVIEW OF SYSTEMS: [name] has no other significant issues involving her other body systems.  PHYSICAL EXAM: BP 124/78  Pulse 82  Wt 167 lb 11.2 oz (76.068 kg) Gain of 3 pounds Constitutional: The patient looks  healthy and appears physically and emotionally well. She looks like she rushed to get here today.   Eyes: There is no arcus or proptosis.  Mouth: The oropharynx appears normal. The tongue appears normal. There is normal oral  moisture. There is no obvious gingivitis. Neck: There are no bruits present. The thyroid gland appears normal in size. The thyroid gland is approximately 20+ grams in size. The right lobe is within normal limits. The left lobe is slightly enlarged, essentially unchanged from last visit. The consistency of the thyroid gland is normal. There is no thyroid tenderness to palpation. Lungs: The lungs are clear. Air movement is good. Heart: The heart rhythm and rate appear normal. Heart sounds S1 and S2 are normal. I do not appreciate any pathologic heart murmurs. Abdomen: The abdominal size is normal. Bowel sounds are normal. The abdomen is soft and non-tender. There is no obviously palpable hepatomegaly, splenomegaly, or other masses.  Arms: Muscle mass appears appropriate for age.  Hands: There is no obvious tremor. Phalangeal and metacarpophalangeal joints appear normal. Palms are warm. She has mild pallor of the fingers that resolves with flexing of the fingers. Legs: Muscle mass appears appropriate for age. There is no edema.  Neurologic: Muscle strength is normal for age and gender  in both the upper and the lower extremities. Muscle tone appears normal. Sensation to touch is normal in the legs.  LAB DATA:  03/16/13: TSH 2.862, free T4 1.14, free T3 2.7 01/22/13: TSH 4.261, free T4 1.14, free T3 2.9 06/21/12: TSH 2.427, free T4 1.11, free T3 3.1 03/20/12: TSH 2.980, free T4 1.14, free T3 3.1  ASSESSMENT: 1. Hypothyroid: Patient appears clinically to be euthyroid, but borderline low. Her recent TSH is in the high-normal range. I'd really like to see her TSH more in the 1.0-2.0 range. She appears to be continuing to lose thyrocytes, gradually but progressively. .  2. Thyroiditis: Clinically quiescent, but intermittently active. 3. Goiter: Essentially unchanged from last visit. 4. Pallor: This appears to be more of a positional issue, not anemia per se.  Plan:  1. Diagnostic: TFTs in 3 and 6  months. 2. Therapeutic: Increase Synthroid to 1.5 of the 75 mcg pills daily.  3. Patient education: We discussed the fact that she is gradually losing thyroid cells over time. Therefore we will need to check her TFTs every 3-6 months and increase the dose of Synthroid as needed.  4. Follow-up: FU in six months.   Level of Service: This visit lasted in excess of 40 minutes. More than 50% of the visit was devoted to counseling.  David Stall

## 2013-09-06 ENCOUNTER — Other Ambulatory Visit: Payer: Self-pay | Admitting: *Deleted

## 2013-09-06 DIAGNOSIS — E038 Other specified hypothyroidism: Secondary | ICD-10-CM

## 2013-09-27 ENCOUNTER — Ambulatory Visit: Payer: BC Managed Care – PPO | Admitting: "Endocrinology

## 2013-10-04 LAB — TSH: TSH: 2.393 u[IU]/mL (ref 0.350–4.500)

## 2013-10-11 ENCOUNTER — Ambulatory Visit (INDEPENDENT_AMBULATORY_CARE_PROVIDER_SITE_OTHER): Payer: BC Managed Care – PPO | Admitting: "Endocrinology

## 2013-10-11 ENCOUNTER — Encounter: Payer: Self-pay | Admitting: "Endocrinology

## 2013-10-11 VITALS — BP 127/74 | HR 79 | Wt 172.6 lb

## 2013-10-11 DIAGNOSIS — E063 Autoimmune thyroiditis: Secondary | ICD-10-CM

## 2013-10-11 DIAGNOSIS — E049 Nontoxic goiter, unspecified: Secondary | ICD-10-CM

## 2013-10-11 DIAGNOSIS — E038 Other specified hypothyroidism: Secondary | ICD-10-CM

## 2013-10-11 DIAGNOSIS — R5381 Other malaise: Secondary | ICD-10-CM

## 2013-10-11 NOTE — Patient Instructions (Signed)
Follow up visit in 6 months. 

## 2013-10-11 NOTE — Progress Notes (Signed)
CC: FU of hypothyroidism, secondary to Hashimoto's thyroiditis and goiter  HPI: 32 y.o. Caucasian woman 1. Nancy [name] was diagnosed with hypothyroidism due to Hashimoto's Thyroiditis about September 2009, 6-7 months after the birth of her second child. She was started on levothyroxine. On 02/03/11 I saw her as a new patient. She felt better on Synthroid, but was still cold. She had a 20+ gram gland. The remainder of her exam was normal.  2. During the past two years we have gradually increased her dose of Synthroid to 1.5 of the 75 mcg/day pills every day. Her symptoms of coldness and fatigue have improved.   3. Her last clinic visit was on 03/28/13. In the interim she has been healthy, except for migraines. The migraines have decreased in frequency to 2-3 times per month. She stopped Topamax due to somnolence. She is not on birth control pills.  She remains on Synthroid, 75 mcg tabs, 1.5 pills daily. Her son still has problems with encopresis. Her mother has completed chemotherapy for lung cancer and is now undergoing XRT. The family will get together at [facility] in December.    4. PROS: Constitutional. The patient feels "pretty good". She is sometimes tired, in part because she routinely gets up about 3 AM to check her son's BG during the night. She is healthy overall and has no significant complaints that pertain to today's visit. Energy: Energy level is" low-normal".   Sleep: The patient usually sleeps well, except for having to get up in the middle of the night.  Body temperature: The patient's body temperature still tends to be colder.  Weight: Weight has increased another 5 pounds. Eyes: The patient's vision is good. There are no significant problems with soreness, bulging, or limited range of eye movements. Neck: The patient is not aware of any problems relating to the anterior neck and thyroid bed. There have been no significant problems swelling, pain, soreness, tenderness,  pressure, discomfort, or difficulty swallowing. Heart: The patient feels the expected increase in heart rate during exercise or other physical activities. There have been no significant problems with palpitations, irregular heart beats, chest pain, or chest pressure. Gastrointestinal: Stomach and intestines seem to be working normally. Bowel movements are normal. There are no significant complaints of excessive hunger, acid reflux, upset stomach, stomach aches or pains, diarrhea, or constipation. Musculoskeletal: Muscles and extremities appear to be working normally. There are no significant problems with hand tremor, sweaty palms, palmar erythema, or lower leg swelling. Psychological: Mood and psychological responses seem to be normal. There have been no significant problems with sadness, depression, irritability, anger, or inappropriate responses to the actions of others. Mental: The patient has not had any problems with abilities to think, to pay attention, to remember, and to make decisions.      GYN: LMP 10/06/13. She is having regular cycles. She is not on birth control. Her husband had a vasectomy.  PAST MEDICAL, FAMILY, AND SOCIAL HISTORY: 1. Family and Work: She is still at home taking care of her children. 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 The family now lives in [place], [place].  2. Activities: She does exercise videos about three days per week.   3. Tobacco, alcohol, and drugs: None 4. PCP: Dr. [name]  REVIEW OF SYSTEMS: Nancy [name] has no other significant issues involving her other body systems.  PHYSICAL EXAM: BP 127/74  Pulse 79  Wt 172 lb 9.6 oz (78.291 kg) Gain of 5 pounds Constitutional:  The patient looks healthy and appears physically and emotionally well. She does not look tired.   Eyes: There is no arcus or proptosis.  Mouth: The oropharynx appears normal. The tongue appears normal. There is normal oral moisture. There is no obvious gingivitis. Neck:  There are no bruits present. The thyroid gland appears normal in size. The thyroid gland is approximately 20+ grams in size. The right lobe is within normal limits. The left lobe is slightly enlarged, essentially unchanged from last visit. The consistency of the thyroid gland is normal. There is no thyroid tenderness to palpation. Lungs: The lungs are clear. Air movement is good. Heart: The heart rhythm and rate appear normal. Heart sounds S1 and S2 are normal. I do not appreciate any pathologic heart murmurs. Abdomen: The abdomen is normal in size. Bowel sounds are normal. The abdomen is soft and non-tender. There is no obviously palpable hepatomegaly, splenomegaly, or other masses.  Arms: Muscle mass appears appropriate for age.  Hands: There is no obvious tremor. Phalangeal and metacarpophalangeal joints appear normal. Palms are warm. She has no pallor of the fingers when she flexes her fingers. Legs: Muscle mass appears appropriate for age. There is no edema.  Neurologic: Muscle strength is normal for age and gender  in both the upper and the lower extremities. Muscle tone appears normal. Sensation to touch is normal in the legs.  LAB DATA:  10/04/13: TSH 2.393, free T4 1.34, free T3 2.9 03/16/13: TSH 2.862, free T4 1.14, free T3 2.7 01/22/13: TSH 4.261, free T4 1.14, free T3 2.9 06/21/12: TSH 2.427, free T4 1.11, free T3 3.1 03/20/12: TSH 2.980, free T4 1.14, free T3 3.1  ASSESSMENT: 1. Hypothyroid: Patient is clinically and chemically euthyroid on her current dose of Synthroid. She is continuing to lose thyrocytes gradually but progressively, but she still has some thyrocytes that are functional. 2. Thyroiditis: Clinically quiescent, but intermittently active. 3. Goiter: Essentially unchanged from last visit. 4. Pallor: This appears to be more of a positional issue, not anemia per se. 5. Fatigue: This problem is likely due to her getting up in the middle of the night every night.  Plan:  1.  Diagnostic: TFTs in 3 and 6 months. I gave her the lab slip for the 3 month lab draw.  2. Therapeutic: Continue current Synthroid dose, but increase the dose as needed.   3. Patient education: We discussed the fact that she is gradually losing thyroid cells over time. Therefore we will need to check her TFTs every 3-6 months and increase the dose of Synthroid as needed.  4. Follow-up: FU in six months.   Level of Service: This visit lasted in excess of 40 minutes. More than 50% of the visit was devoted to counseling.  David Stall

## 2013-10-28 ENCOUNTER — Other Ambulatory Visit: Payer: Self-pay | Admitting: "Endocrinology

## 2013-10-28 DIAGNOSIS — E038 Other specified hypothyroidism: Secondary | ICD-10-CM

## 2014-01-12 LAB — TSH: TSH: 1.652 u[IU]/mL (ref 0.350–4.500)

## 2014-01-12 LAB — T3, FREE: T3, Free: 3.1 pg/mL (ref 2.3–4.2)

## 2014-01-12 LAB — T4, FREE: Free T4: 1.33 ng/dL (ref 0.80–1.80)

## 2014-01-14 ENCOUNTER — Encounter: Payer: Self-pay | Admitting: *Deleted

## 2014-02-27 ENCOUNTER — Other Ambulatory Visit: Payer: Self-pay | Admitting: "Endocrinology

## 2014-03-27 ENCOUNTER — Other Ambulatory Visit: Payer: Self-pay | Admitting: *Deleted

## 2014-03-27 DIAGNOSIS — E038 Other specified hypothyroidism: Secondary | ICD-10-CM

## 2014-04-03 LAB — T3, FREE: T3, Free: 3 pg/mL (ref 2.3–4.2)

## 2014-04-03 LAB — T4, FREE: Free T4: 1.27 ng/dL (ref 0.80–1.80)

## 2014-04-03 LAB — TSH: TSH: 4.281 u[IU]/mL (ref 0.350–4.500)

## 2014-04-11 ENCOUNTER — Ambulatory Visit (INDEPENDENT_AMBULATORY_CARE_PROVIDER_SITE_OTHER): Payer: 59 | Admitting: "Endocrinology

## 2014-04-11 ENCOUNTER — Encounter: Payer: Self-pay | Admitting: "Endocrinology

## 2014-04-11 VITALS — BP 123/68 | HR 85 | Wt 172.4 lb

## 2014-04-11 DIAGNOSIS — E063 Autoimmune thyroiditis: Secondary | ICD-10-CM

## 2014-04-11 DIAGNOSIS — R5383 Other fatigue: Secondary | ICD-10-CM

## 2014-04-11 DIAGNOSIS — E038 Other specified hypothyroidism: Secondary | ICD-10-CM

## 2014-04-11 DIAGNOSIS — E049 Nontoxic goiter, unspecified: Secondary | ICD-10-CM

## 2014-04-11 DIAGNOSIS — R5381 Other malaise: Secondary | ICD-10-CM

## 2014-04-11 MED ORDER — LEVOTHYROXINE SODIUM 125 MCG PO TABS: ORAL_TABLET | ORAL | Status: DC

## 2014-04-11 NOTE — Progress Notes (Signed)
CC: FU of hypothyroidism, secondary to Hashimoto's thyroiditis and goiter  HPI: 33 y.o. Caucasian woman 1. Nancy [name] was diagnosed with hypothyroidism due to Hashimoto's Thyroiditis about September 2009, 6-7 months after the birth of her second child. She was started on levothyroxine. On 02/03/11 I saw her as a new patient. She felt better on Synthroid, but was still cold. She had a 20+ gram gland. The remainder of her exam was normal.  2. During the past two years we have gradually increased her dose of Synthroid to 1.5 of the 75 mcg/day pills every day. Her symptoms of coldness and fatigue have improved.   3. Her last clinic visit was on 09/24/13. In the interim she has been healthy, except for migraines. The migraines have decreased in frequency to 1-2 times per month. She stopped Topamax due to somnolence. She is not on birth control pills.  She remains on Synthroid, 75 mcg tabs, 1.5 pills daily. Her mother has completed chemotherapy for lung cancer and is now undergoing XRT. Her cancer seems to be in remission. The family will get together soon in their home in [place], [place].    4. PROS: Constitutional. The patient feels "good, but tired a lot". She is sometimes tired, in part because she routinely gets up about 3 AM to check her son's BG during the night. She is healthy overall and has no significant complaints that pertain to today's visit. Energy: Energy level is" low after noon".  She tends to be somewhat cold.  Sleep: The patient usually sleeps well, except for having to get up in the middle of the night.  Body temperature: The patient's body temperature still tends to be colder.  Weight: Weight is stable. Eyes: The patient's vision is good. There are no significant problems with soreness, bulging, or limited range of eye movements. Neck: The patient is not aware of any problems relating to the anterior neck and thyroid bed. There have been no significant problems swelling, pain,  soreness, tenderness, pressure, discomfort, or difficulty swallowing. Heart: The patient feels the expected increase in heart rate during exercise or other physical activities. There have been no significant problems with palpitations, irregular heart beats, chest pain, or chest pressure. Gastrointestinal: Stomach and intestines seem to be working normally. Bowel movements are normal. There are no significant complaints of excessive hunger, acid reflux, upset stomach, stomach aches or pains, diarrhea, or constipation. Musculoskeletal: Muscles and extremities appear to be working normally. There are no significant problems with hand tremor, sweaty palms, palmar erythema, or lower leg swelling. Psychological: Mood and psychological responses seem to be normal. There have been no significant problems with sadness, depression, irritability, anger, or inappropriate responses to the actions of others. Mental: The patient has not had any problems with abilities to think, to pay attention, to remember, and to make decisions.      GYN: LMP 03/25/14. She is having regular cycles. She is not on birth control. Her husband had a vasectomy. Libido is OK.   PAST MEDICAL, FAMILY, AND SOCIAL HISTORY: 1. Family and Work: She is still at home taking care of her children. 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 . The family now lives in [place], [place].  2. Activities: She does zumba twice a week and other exercise 1-3 times per week.    3. Tobacco, alcohol, and drugs: None 4. PCP: Dr. Merri Brunetteandace Smith  REVIEW OF SYSTEMS: Nancy [name] has no other significant issues involving  her other body systems.  PHYSICAL EXAM: BP 123/68  Pulse 85  Wt 172 lb 6.4 oz (78.2 kg) Weight is stable. Constitutional: The patient looks healthy and appears physically and emotionally well. She does not look tired.   Eyes: There is no arcus or proptosis.  Mouth: The oropharynx appears normal. The tongue  appears normal. There is normal oral moisture. There is no obvious gingivitis. Neck: There are no bruits present. The thyroid gland appears mildly enlarged on inspection. The thyroid gland is slightly larger today at approximately 20-22 grams in size. Both lobes are mildly enlarged today. The consistency of the thyroid gland is normal. There is no thyroid tenderness to palpation. Lungs: The lungs are clear. Air movement is good. Heart: The heart rhythm and rate appear normal. Heart sounds S1 and S2 are normal. I do not appreciate any pathologic heart murmurs. Abdomen: The abdomen is normal in size. Bowel sounds are normal. The abdomen is soft and non-tender. There is no obviously palpable hepatomegaly, splenomegaly, or other masses.  Arms: Muscle mass appears appropriate for age.  Hands: There is no obvious tremor. Phalangeal and metacarpophalangeal joints appear normal. Palms are warm. She has no pallor of the fingers when she flexes her fingers. Legs: Muscle mass appears appropriate for age. There is no edema.  Neurologic: Muscle strength is normal for age and gender  in both the upper and the lower extremities. Muscle tone appears normal. Sensation to touch is normal in the legs.  LAB DATA:  04/03/14: TSH 4.281, free T4 1.27, free T3 3.0 10/04/13: TSH 2.393, free T4 1.34, free T3 2.9 03/16/13: TSH 2.862, free T4 1.14, free T3 2.7 01/22/13: TSH 4.261, free T4 1.14, free T3 2.9 06/21/12: TSH 2.427, free T4 1.11, free T3 3.1 03/20/12: TSH 2.980, free T4 1.14, free T3 3.1  ASSESSMENT: 1. Hypothyroid: Patient is clinically and chemically hypothyroid on her current dose of Synthroid. She is continuing to lose thyrocytes gradually but progressively, but she still has some thyrocytes that are functional. She needs a small increase in Synthroid dose.  2. Thyroiditis: Clinically quiescent, but intermittently active. 3. Goiter: Her goiter is somewhat larger today, indicating relatively recent  inflammation. 4. Pallor: Resolved. 5. Fatigue: This problem is partly due to her getting up in the middle of the night every night, but also due to being hypothyroid. .  Plan:  1. Diagnostic: TFTs in 2 and 6 months.  2. Therapeutic: Increase Synthroid to 125 mcg/day.    3. Patient education: We discussed the fact that she is gradually losing thyroid cells over time. Therefore we will need to check her TFTs every 3-6 months and increase the dose of Synthroid as needed.  4. Follow-up: FU in six months.   Level of Service: This visit lasted in excess of 40 minutes. More than 50% of the visit was devoted to counseling.  David StallMichael J Brennan

## 2014-04-11 NOTE — Patient Instructions (Signed)
Follow up visit in 6 monhts. Please have lab tests drawn in 2 months and one week prior to next appointment.

## 2014-08-01 ENCOUNTER — Encounter: Payer: Self-pay | Admitting: "Endocrinology

## 2014-09-30 ENCOUNTER — Other Ambulatory Visit: Payer: Self-pay | Admitting: *Deleted

## 2014-09-30 DIAGNOSIS — E034 Atrophy of thyroid (acquired): Secondary | ICD-10-CM

## 2014-10-07 LAB — TSH: TSH: 2.478 u[IU]/mL (ref 0.350–4.500)

## 2014-10-07 LAB — T3, FREE: T3, Free: 2.8 pg/mL (ref 2.3–4.2)

## 2014-10-07 LAB — T4, FREE: Free T4: 1.39 ng/dL (ref 0.80–1.80)

## 2014-10-14 ENCOUNTER — Ambulatory Visit (INDEPENDENT_AMBULATORY_CARE_PROVIDER_SITE_OTHER): Payer: 59 | Admitting: "Endocrinology

## 2014-10-14 ENCOUNTER — Ambulatory Visit (INDEPENDENT_AMBULATORY_CARE_PROVIDER_SITE_OTHER): Payer: 59 | Admitting: *Deleted

## 2014-10-14 ENCOUNTER — Encounter: Payer: Self-pay | Admitting: "Endocrinology

## 2014-10-14 VITALS — BP 121/73 | HR 80 | Wt 177.0 lb

## 2014-10-14 DIAGNOSIS — Z23 Encounter for immunization: Secondary | ICD-10-CM

## 2014-10-14 DIAGNOSIS — R5383 Other fatigue: Secondary | ICD-10-CM

## 2014-10-14 DIAGNOSIS — E049 Nontoxic goiter, unspecified: Secondary | ICD-10-CM

## 2014-10-14 DIAGNOSIS — E038 Other specified hypothyroidism: Secondary | ICD-10-CM

## 2014-10-14 DIAGNOSIS — E063 Autoimmune thyroiditis: Secondary | ICD-10-CM

## 2014-10-14 MED ORDER — LEVOTHYROXINE SODIUM 137 MCG PO TABS: ORAL_TABLET | ORAL | Status: DC

## 2014-10-14 NOTE — Patient Instructions (Signed)
Follow up visit in 6 months. To use up the Synthroid 125 mcg tablets, take one tablet per day for 6 days per week, but add an extra one-half tablet on Sundays.

## 2014-10-14 NOTE — Progress Notes (Signed)
CC: FU of hypothyroidism, secondary to Hashimoto's thyroiditis and goiter  HPI: 33 y.o. Caucasian woman 1. Nancy [name] was diagnosed with hypothyroidism due to Hashimoto's Thyroiditis about September 2009, 6-7 months after the birth of her second child. She was started on levothyroxine. On 02/03/11 I saw her as a new patient. She felt better on Synthroid, but was still cold. She had a 20+ gram gland. The remainder of her exam was normal.  2. During the past two years we have gradually increased her dose of Synthroid to 0.125 mg = 125 mcg/day. Her symptoms of coldness and fatigue have improved.   3. Her last clinic visit was on 04/11/14. In the interim she has been healthy, except for migraines. The migraines have decreased in frequency to 2-3 times per month. She is not on birth control pills.  She remains on Synthroid, 125 mcg daily. Her mother has finished chemotherapy and XRT for lung cancer. Her cancer seems to be in remission.   4. Pertinent Review of Systems: Constitutional. The patient feels "cold and tired a lot". She is tired in part, because she routinely gets up about 3 AM to check her son's BG during the night. She is healthy overall and has no significant complaints that pertain to today's visit. Energy: Energy level is" low after noon".   Sleep: The patient usually sleeps well, except for having to get up in the middle of the night. Sometimes she does have trouble getting back to sleep. Body temperature: The patient's body temperature still tends to be colder.  Weight: Weight is stable. Eyes: The patient's vision is good. There are no problems with soreness, bulging, or limited range of eye movements. Neck: The patient is not aware of any problems relating to the anterior neck and thyroid bed. There have been no significant problems swelling, pain, soreness, tenderness, pressure, discomfort, or difficulty swallowing. Heart: The patient feels the expected increase in heart rate  during exercise or other physical activities. There have been no significant problems with palpitations, irregular heart beats, chest pain, or chest pressure. Gastrointestinal: Stomach and intestines seem to be working normally. Bowel movements are normal. There are no significant complaints of excessive hunger, acid reflux, upset stomach, stomach aches or pains, diarrhea, or constipation. Musculoskeletal: Muscles and extremities appear to be working normally. There are no significant problems with hand tremor, sweaty palms, palmar erythema, or lower leg swelling. Psychological: Mood and psychological responses seem to be normal. There have been no significant problems with sadness, depression, irritability, anger, or inappropriate responses to the actions of others. Mental: The patient has not had any problems with abilities to think, to pay attention, to remember, and to make decisions.      GYN: LMP early October. She is having regular cycles. She is not on birth control. Her husband had a vasectomy. Libido is OK.   PAST MEDICAL, FAMILY, AND SOCIAL HISTORY: 1. Family and Work: She is still at home taking care of her children. 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 The family now lives in [place], [place].  2. Activities: She has not been exercising.     3. Tobacco, alcohol, and drugs: None 4. PCP: Dr. Merri Brunetteandace Smith  REVIEW OF SYSTEMS: Nancy [name] has no other significant issues involving her other body systems.  PHYSICAL EXAM: BP 121/73 mmHg  Pulse 80  Wt 177 lb (80.287 kg) Weight is stable.  Constitutional: The patient looks healthy and appears physically and emotionally well, but does look tired. She has gained 5 pounds in the last 6 months, equivalent to a net gain of about 95 calories per day..   Eyes: There is no arcus or proptosis.  Mouth: The oropharynx appears normal. The tongue appears normal.  There is normal oral moisture. There is no obvious gingivitis. Neck: There are no bruits present. The thyroid gland appears mildly enlarged on inspection. The thyroid gland is again slightly enlarged at approximately 20-22 grams in size. Both lobes are mildly enlarged today. The consistency of the thyroid gland is normal. There is no thyroid tenderness to palpation. Lungs: The lungs are clear. Air movement is good. Heart: The heart rhythm and rate appear normal. Heart sounds S1 and S2 are normal. I do not appreciate any pathologic heart murmurs. Abdomen: The abdomen is normal in size. Bowel sounds are normal. The abdomen is soft and non-tender. There is no obviously palpable hepatomegaly, splenomegaly, or other masses.  Arms: Muscle mass appears appropriate for age.  Hands: There is no obvious tremor. Phalangeal and metacarpophalangeal joints appear normal. Palms are warm. She has no pallor of the fingers when she flexes her fingers. Legs: Muscle mass appears appropriate for age. There is no edema.  Neurologic: Muscle strength is normal for age and gender  in both the upper and the lower extremities. Muscle tone appears normal. Sensation to touch is normal in the legs.  LAB DATA:   10/07/14: TSH 2.478, free T4 1.39, free T3 2.8  04/03/14: TSH 4.281, free T4 1.27, free T3 3.0  10/04/13: TSH 2.393, free T4 1.34, free T3 2.9  03/16/13: TSH 2.862, free T4 1.14, free T3 2.7  01/22/13: TSH 4.261, free T4 1.14, free T3 2.9  06/21/12: TSH 2.427, free T4 1.11, free T3 3.1  03/20/12: TSH 2.980, free T4 1.14, free T3 3.1  ASSESSMENT: 1. Hypothyroid: Patient is clinically and chemically euthyroid, but in the lower quartile of the normal range. She is continuing to lose thyrocytes gradually but progressively, but she still has some thyrocytes that are functional. She needs a small increase in Synthroid dose to put her into the middle of the normal thyroid hormone/TSH range..  2. Thyroiditis: Clinically  quiescent, but intermittently active. 3. Goiter: Her goiter is about the same size today. The waxing and waning of thyroid gland size over time is c/w evolving Hashimoto's disease.  4. Pallor: Resolved. 5. Fatigue: This problem is partly due to her getting up in the middle of the night every night, but also due to being in the lower quartile of the normal range.  Plan:  1. Diagnostic: TFTs in 2 and 6 months. Also check iron and CBC prior to next visit.  2. Therapeutic: Increase Synthroid to 137 mcg/day. To use up the remaining 125 mcg pills, take an extra 1/2 pill each Sunday until they are gone.   3. Patient education: We discussed the fact that she is gradually losing thyroid cells over time. Therefore we will need to check her TFTs every 3-6 months and increase the dose of Synthroid as needed.  4. Follow-up: FU in six months.   Level of Service: This visit lasted in excess of 40 minutes. More than 50% of the visit was devoted to counseling.  David StallBRENNAN,Nancy [name]

## 2014-12-23 ENCOUNTER — Telehealth: Payer: Self-pay | Admitting: "Endocrinology

## 2014-12-23 NOTE — Telephone Encounter (Signed)
Advised patient that labs are in portal. KW

## 2015-01-13 LAB — T4, FREE: Free T4: 1.31 ng/dL (ref 0.80–1.80)

## 2015-01-13 LAB — T3, FREE: T3 FREE: 3 pg/mL (ref 2.3–4.2)

## 2015-01-13 LAB — TSH: TSH: 1.441 u[IU]/mL (ref 0.350–4.500)

## 2015-01-21 ENCOUNTER — Encounter: Payer: Self-pay | Admitting: "Endocrinology

## 2015-01-28 ENCOUNTER — Other Ambulatory Visit: Payer: Self-pay | Admitting: Family Medicine

## 2015-01-28 ENCOUNTER — Encounter: Payer: Self-pay | Admitting: *Deleted

## 2015-01-28 DIAGNOSIS — R251 Tremor, unspecified: Secondary | ICD-10-CM

## 2015-01-30 ENCOUNTER — Other Ambulatory Visit (HOSPITAL_COMMUNITY)
Admission: RE | Admit: 2015-01-30 | Discharge: 2015-01-30 | Disposition: A | Discharge status: Home / Self Care | Payer: BLUE CROSS/BLUE SHIELD | Source: Ambulatory Visit | Attending: Nurse Practitioner | Admitting: Nurse Practitioner

## 2015-01-30 ENCOUNTER — Other Ambulatory Visit: Payer: Self-pay | Admitting: Nurse Practitioner

## 2015-01-30 DIAGNOSIS — Z1151 Encounter for screening for human papillomavirus (HPV): Secondary | ICD-10-CM | POA: Insufficient documentation

## 2015-01-30 DIAGNOSIS — Z01419 Encounter for gynecological examination (general) (routine) without abnormal findings: Secondary | ICD-10-CM | POA: Insufficient documentation

## 2015-01-31 ENCOUNTER — Ambulatory Visit
Admission: RE | Admit: 2015-01-31 | Discharge: 2015-01-31 | Disposition: A | Discharge status: Home / Self Care | Payer: BLUE CROSS/BLUE SHIELD | Source: Ambulatory Visit | Attending: Family Medicine | Admitting: Family Medicine

## 2015-01-31 DIAGNOSIS — R251 Tremor, unspecified: Secondary | ICD-10-CM

## 2015-01-31 LAB — CYTOLOGY - PAP

## 2015-01-31 MED ORDER — GADOBENATE DIMEGLUMINE 529 MG/ML IV SOLN
15.0000 mL | Freq: Once | INTRAVENOUS | Status: AC | PRN
  Administered 2015-01-31: 15 mL via INTRAVENOUS

## 2015-02-18 ENCOUNTER — Other Ambulatory Visit: Payer: Self-pay | Admitting: Nurse Practitioner

## 2015-04-09 ENCOUNTER — Other Ambulatory Visit: Payer: Self-pay | Admitting: *Deleted

## 2015-04-09 DIAGNOSIS — E034 Atrophy of thyroid (acquired): Secondary | ICD-10-CM

## 2015-04-10 LAB — CBC WITH DIFFERENTIAL/PLATELET
BASOS ABS: 0 10*3/uL (ref 0.0–0.1)
BASOS PCT: 1 % (ref 0–1)
EOS ABS: 0 10*3/uL (ref 0.0–0.7)
Eosinophils Relative: 1 % (ref 0–5)
HCT: 39.9 % (ref 36.0–46.0)
Hemoglobin: 13.3 g/dL (ref 12.0–15.0)
Lymphocytes Relative: 36 % (ref 12–46)
Lymphs Abs: 1.5 10*3/uL (ref 0.7–4.0)
MCH: 29.3 pg (ref 26.0–34.0)
MCHC: 33.3 g/dL (ref 30.0–36.0)
MCV: 87.9 fL (ref 78.0–100.0)
MPV: 11.1 fL (ref 8.6–12.4)
Monocytes Absolute: 0.3 10*3/uL (ref 0.1–1.0)
Monocytes Relative: 6 % (ref 3–12)
Neutro Abs: 2.4 10*3/uL (ref 1.7–7.7)
Neutrophils Relative %: 56 % (ref 43–77)
PLATELETS: 193 10*3/uL (ref 150–400)
RBC: 4.54 MIL/uL (ref 3.87–5.11)
RDW: 13.1 % (ref 11.5–15.5)
WBC: 4.3 10*3/uL (ref 4.0–10.5)

## 2015-04-10 LAB — IRON: Iron: 95 ug/dL (ref 42–145)

## 2015-04-10 LAB — T3, FREE: T3 FREE: 2.6 pg/mL (ref 2.3–4.2)

## 2015-04-10 LAB — TSH: TSH: 2.563 u[IU]/mL (ref 0.350–4.500)

## 2015-04-10 LAB — T4, FREE: FREE T4: 1.29 ng/dL (ref 0.80–1.80)

## 2015-04-14 ENCOUNTER — Ambulatory Visit (INDEPENDENT_AMBULATORY_CARE_PROVIDER_SITE_OTHER): Payer: BLUE CROSS/BLUE SHIELD | Admitting: "Endocrinology

## 2015-04-14 ENCOUNTER — Encounter: Payer: Self-pay | Admitting: "Endocrinology

## 2015-04-14 VITALS — BP 128/73 | HR 85 | Wt 171.7 lb

## 2015-04-14 DIAGNOSIS — E063 Autoimmune thyroiditis: Secondary | ICD-10-CM | POA: Diagnosis not present

## 2015-04-14 DIAGNOSIS — E049 Nontoxic goiter, unspecified: Secondary | ICD-10-CM

## 2015-04-14 DIAGNOSIS — E038 Other specified hypothyroidism: Secondary | ICD-10-CM | POA: Diagnosis not present

## 2015-04-14 DIAGNOSIS — R5383 Other fatigue: Secondary | ICD-10-CM | POA: Diagnosis not present

## 2015-04-14 DIAGNOSIS — R251 Tremor, unspecified: Secondary | ICD-10-CM

## 2015-04-14 NOTE — Progress Notes (Signed)
CC: FU of hypothyroidism, secondary to Hashimoto's thyroiditis, goiter, and fatigue.  HPI: 34 y.o. Caucasian woman 1. Nancy [name] was diagnosed with hypothyroidism due to Hashimoto's Thyroiditis about September 2009, 6-7 months after the birth of her second child. She was started on levothyroxine. On 02/03/11 I saw her as a new patient. She felt better on Synthroid, but was still cold. She had a 20+ gram gland. The remainder of her exam was normal.  2. During the past four years we have gradually increased her dose of Synthroid to 0.125 mg = 125 mcg/day. Her symptoms of coldness and fatigue have improved.   3. Her last clinic visit was on 10/14/14.   A. In the interim she has been healthy, except for having 2 or more periods per month earlier this calendar year. She saw her OB. Tests and pelvic US were normal. She started a low-dose OCP, but she got sick and stopped it. She has resumed normal periods.   B. Her husband has noted that she has a head tremor occasionally when she is stressed..  C. She continues to have 8-10 migraines per month, more frequently in the peri-menstrual time period. She also has post-migraine weakness and motor control in her arms and hands. Now she sometimes has the arm and hand symptoms without having had migraines. The weakness and poor coordination may last for a few hours or may persist through going to bed at night. She has a head MRI which was normal. If symptoms persist she will see a neurologist.  D. She remains on Synthroid, 137 mcg daily.   E. Her mother has finished chemotherapy and XRT for lung cancer metastatic to the brain. Her cancer seems to be in remission.   4. Pertinent Review of Systems: Constitutional. The patient feels "fairly good and not as cold and tired as I've been in the past". She still routinely gets up about 3 AM to check her son's BG during the night. She takes care of her own two children and is also caring for two other kids in her  home for home daycare. Energy: Energy level is "OK. I have enough energy to do what I need to do during the day.".   Sleep: The patient usually sleeps well, except for having to get up in the middle of the night to check on her son's BGs.  Body temperature: The patient's body temperature still tends to be colder, but is better overall.  Weight: She has been trying to lose weight and has lost 6 pounds since her last visit.  Eyes: The patient's vision is good. There are no problems with soreness, bulging, or limited range of eye movements. Neck: The patient is not aware of any problems relating to the anterior neck and thyroid bed. There have been no significant problems swelling, pain, soreness, tenderness, pressure, discomfort, or difficulty swallowing. Heart: The patient feels the expected increase in heart rate during exercise or other physical activities. There have been no significant problems with palpitations, irregular heart beats, chest pain, or chest pressure. Gastrointestinal: Stomach and intestines seem to be working normally. Bowel movements are normal. There are no significant complaints of excessive hunger, acid reflux, upset stomach, stomach aches or pains, diarrhea, or constipation. Musculoskeletal: As above. Muscles and extremities appear to be working normally otherwise. There are no significant problems with hand tremor, sweaty palms, palmar erythema, or lower leg swelling. Psychological: Mood and psychological responses seem to be normal. There have been no significant problems with  sadness, depression, irritability, anger, or inappropriate responses to the actions of others. Mental: The patient has not had any problems with abilities to think, to pay attention, to remember, and to make decisions.      GYN: As above. LMP last week. She is having regular cycles again. She is not on birth control. Her husband had a vasectomy. Libido is OK.   PAST MEDICAL, FAMILY, AND SOCIAL  HISTORY: 1. Family and Work: She is still at home taking care of her children. 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 The family now lives in [place], [place]. Her sister was recently diagnosed with hypothyroidism secondary to Hashimoto's thyroiditis. She has a grand aunt who had parkinson's Dz. Her mother, who has lung cancer with mets to the brain and has had XRT of the brain, has developed some tremor now. 2. Activities: She has not been exercising recently, but is trying to get back on the wagon. She has been gardening more often. 3. Tobacco, alcohol, and drugs: None 4. PCP: Dr. [name]  REVIEW OF SYSTEMS: Ms. shaquela weichert has no other significant issues involving her other body systems.  PHYSICAL EXAM: BP 128/73 mmHg  Pulse 85  Wt 171 lb 11.2 oz (77.883 kg) She has lost 6 pounds.  Constitutional: The patient looks healthy and appears physically and emotionally well. She does not look tired. She is bright and alert. I do not detect any head tremor today.    Eyes: There is no arcus or proptosis.  Mouth: The oropharynx appears normal. The tongue appears normal. There is normal oral moisture. There is no obvious gingivitis. Neck: There are no bruits present. The thyroid gland appears normal on inspection. The thyroid gland is now within normal limits for size at 20 grams. The left lobe is at the upper limit of normal for size. The right lobe is well within normal limits. The consistency of the thyroid gland is normal. There is no thyroid tenderness to palpation. Lungs: The lungs are clear. Air movement is good. Heart: The heart rhythm and rate appear normal. Heart sounds S1 and S2 are normal. I do not appreciate any pathologic heart murmurs. Abdomen: The abdomen is mildly enlarged. Bowel sounds are normal. The abdomen is soft and non-tender. There is no obviously palpable hepatomegaly, splenomegaly, or other masses.  Arms: Muscle mass appears appropriate for age. She does not  have any cogwheel rigidity.  Hands: She has a trace tremor on the right and 1+ tremor on the left. Phalangeal and metacarpophalangeal joints appear normal. Palms are warm. She has no pallor of the fingers when she flexes her fingers. Legs: Muscle mass appears appropriate for age. There is no edema.  Neurologic: I do not detect any head tremor today. Muscle strength is normal for age and gender  in both the upper and the lower extremities. Muscle tone appears normal. Sensation to touch is normal in the legs.  LAB DATA:   04/09/15: TSH 2.563, free T4 1.29, free T3 2.6; WBC 4.3, Hgb 13.3, Hct 39.9%; iron 95  10/07/14: TSH 2.478, free T4 1.39, free T3 2.8  04/03/14: TSH 4.281, free T4 1.27, free T3 3.0  10/04/13: TSH 2.393, free T4 1.34, free T3 2.9  03/16/13: TSH 2.862, free T4 1.14, free T3 2.7  01/22/13: TSH 4.261, free T4 1.14, free T3 2.9  06/21/12: TSH 2.427, free T4 1.11, free T3 3.1  03/20/12: TSH 2.980, free T4 1.14, free T3 3.1  ASSESSMENT: 1. Hypothyroid: Patient  is clinically euthyroid and is much better at this visit. She is chemically euthyroid, but in the lower quartile of the normal range. She is continuing to lose thyrocytes gradually but progressively, but she still has some thyrocytes that are functional. She will need a small increase in Synthroid dose probably within the next 6 months.  2. Thyroiditis: Clinically quiescent, but intermittently active. 3. Goiter: Her goiter is smaller, now within normal limits. The waxing and waning of thyroid gland size over time is c/w evolving Hashimoto's disease.  4. Pallor: Resolved. 5. Fatigue: This problem is much better.  6. Head tremor: I do not see any evidence of head tremor today. 7. Tremor: We need to follow this issue over time.  8. Arm and hand weakness: Her symptoms may be migraine equivalents. If these symptoms persist she should see a neurologist.   Plan:  1. Diagnostic: TFTs in 3 and 6 months. I put in the order for the  41-month tests. Also check iron and CBC prior to next visit.  2. Therapeutic: Continue the Synthroid dose of 137 mcg/day.  3. Patient education: We discussed the fact that she is gradually losing thyroid cells over time. Therefore we will need to check her TFTs every 3-6 months and increase the dose of Synthroid as needed.  4. Follow-up: FU in six months.   Level of Service: This visit lasted in excess of 40 minutes. More than 50% of the visit was devoted to counseling.  David Stall

## 2015-04-14 NOTE — Patient Instructions (Signed)
Follow u visit in 6 months. Please repeat the thyroid tests in 3 months and 6 months.

## 2015-06-02 ENCOUNTER — Other Ambulatory Visit: Payer: Self-pay | Admitting: "Endocrinology

## 2015-06-25 ENCOUNTER — Other Ambulatory Visit: Payer: Self-pay | Admitting: "Endocrinology

## 2015-07-25 ENCOUNTER — Other Ambulatory Visit: Payer: Self-pay | Admitting: "Endocrinology

## 2015-08-19 ENCOUNTER — Encounter: Payer: Self-pay | Admitting: Neurology

## 2015-08-19 ENCOUNTER — Telehealth: Payer: Self-pay | Admitting: *Deleted

## 2015-08-19 ENCOUNTER — Ambulatory Visit (INDEPENDENT_AMBULATORY_CARE_PROVIDER_SITE_OTHER): Payer: BLUE CROSS/BLUE SHIELD | Admitting: Neurology

## 2015-08-19 VITALS — BP 122/75 | HR 87 | Ht 67.5 in | Wt 179.0 lb

## 2015-08-19 DIAGNOSIS — G43009 Migraine without aura, not intractable, without status migrainosus: Secondary | ICD-10-CM

## 2015-08-19 DIAGNOSIS — G43109 Migraine with aura, not intractable, without status migrainosus: Secondary | ICD-10-CM

## 2015-08-19 MED ORDER — ONDANSETRON 4 MG PO TBDP: 4.0000 mg | ORAL_TABLET | Freq: Three times a day (TID) | ORAL | Status: DC | PRN

## 2015-08-19 MED ORDER — SUMATRIPTAN SUCCINATE 100 MG PO TABS: 100.0000 mg | ORAL_TABLET | Freq: Once | ORAL | Status: DC

## 2015-08-19 MED ORDER — VERAPAMIL HCL ER 120 MG PO CP24: 120.0000 mg | ORAL_CAPSULE | Freq: Every day | ORAL | Status: DC

## 2015-08-19 NOTE — Progress Notes (Signed)
[facility]   Provider:  Dr Lucia Gaskins Referring Provider: Temple Pacini, DO Primary Care Physician:  Julio Sicks A, DO  CC:  migraines  HPI:  34 year old female with Migrains. PMHx hypothyroidism due to Hashimoto's Thyroiditis, fatigue and tremor. She has had migraines since the age of 55. It was a cleaning product that caused it, resolved with changes in environmental factors and since then they come and go. They go in cycles, has them for a while then go away. Worsening the last 3 years. 3 migraines a week now. She tried Topamax and could not tolerate. Took fioricet in the past for acute management. Pain is on the right side of the head. Dizziness. Light headedness. Weakness in the arms after the headaches or during the headaches. Maybe numbness, like they are falling asleep. Left > right side affected. She had a head tremor for a few months that resolved. She had vertigo with her headaches a few weeks ago. She has had 3 occular migraines in her lifetime. The headaches last all day if she wakes up with it. If headache starts is in the afternoon, it may last 6 hours if she sleeps. Throbbing, light and sound sensitivity, nausea but rare vomiting.Dark room staying still helps. 7-8/10 at its worse. No auras. She feels mentally foggy with the migraines. No insomnia. No other focal neurologic deficits. Aunt with Parkinsons Dz.   Reviewed notes, labs and imaging from outside physicians, which showed: CMP 07/2015 unremarkable Creatinine 0.71. CBC nml, TSH nml, LDL 74,   Personally reviewed MRI brain:  Ventricle size normal. Cerebral volume normal. Low lying cerebellar tonsils approximately 5 mm without Chiari malformation. Pituitary normal in size. Negative for acute or chronic infarction Negative for demyelinating disease. Cerebral white matter is normal. Brainstem and basal ganglia are normal. Negative for hemorrhage or mass lesion. Normal enhancement following contrast  infusion. Paranasal sinuses are clear. IMPRESSION: Normal   Review of Systems: Patient complains of symptoms per HPI as well as the following symptoms: headache, feleing cold, spinning, weakness, dizziness, tremor, dec appetite. Pertinent negatives per HPI. All others negative.   Social History   Social History  . Marital Status: Married    Spouse Name: Seymour Bars  . Number of Children: 2  . Years of Education: 16   Occupational History  . 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 Smoking status: Never Smoker   . Smokeless tobacco: Not on file  . Alcohol Use: No  . Drug Use: No  . Sexual Activity: Not on file   Other Topics Concern  . Not on file   Social History Narrative   Lives at home with husband Seymour Bars and 2 children.    Caffeinse use: 2 cups coffee per day    Family History  Problem Relation Age of Onset  . Diabetes Son   . Hypothyroidism Son   . Stroke Maternal Grandmother   . Heart disease Maternal Grandfather     Past Medical History  Diagnosis Date  . Hypothyroidism, acquired, autoimmune   . Thyroiditis, autoimmune     Hashimotos   . Fatigue   . Eczema   . Migraines     Past Surgical History  Procedure Laterality Date  . Mole removal  2010  . Tongue growth  2010  . Wisdom tooth extraction  1999    Current Outpatient Prescriptions  Medication Sig Dispense Refill  . cetirizine (ZYRTEC) 10 MG tablet Take 10 mg by mouth  daily as needed.     [name] levothyroxine (SYNTHROID, LEVOTHROID) 137 MCG tablet TAKE 1 TABLET BY MOUTH EVERY DAY 30 tablet 0  . ondansetron (ZOFRAN-ODT) 4 MG disintegrating tablet Take 1 tablet (4 mg total) by mouth every 8 (eight) hours as needed for nausea. 30 tablet 11  . SUMAtriptan (IMITREX) 100 MG tablet Take 1 tablet (100 mg total) by mouth once. May repeat in 2 hours if headache persists or recurs. 10 tablet 11  . verapamil (VERELAN PM) 120 MG 24 hr capsule Take 1 capsule (120 mg total) by mouth at bedtime. 30  capsule 6   No current facility-administered medications for this visit.    Allergies as of 08/19/2015 - Review Complete 08/19/2015  Allergen Reaction Noted  . Codeine  04/07/2011    Vitals: BP 122/75 mmHg  Pulse 87  Ht 5' 7.5" (1.715 m)  Wt 179 lb (81.194 kg)  BMI 27.61 kg/m2 Last Weight:  Wt Readings from Last 1 Encounters:  08/19/15 179 lb (81.194 kg)   Last Height:   Ht Readings from Last 1 Encounters:  08/19/15 5' 7.5" (1.715 m)   Physical exam: Exam: Gen: NAD, conversant, well nourised, well groomed                     CV: RRR, no MRG. No Carotid Bruits. No peripheral edema, warm, nontender Eyes: Conjunctivae clear without exudates or hemorrhage  Neuro: Detailed Neurologic Exam  Speech:    Speech is normal; fluent and spontaneous with normal comprehension.  Cognition:    The patient is oriented to person, place, and time;     recent and remote memory intact;     language fluent;     normal attention, concentration,     fund of knowledge Cranial Nerves:    The pupils are equal, round, and reactive to light. The fundi are normal and spontaneous venous pulsations are present. Visual fields are full to finger confrontation. Extraocular movements are intact. Trigeminal sensation is intact and the muscles of mastication are normal. The face is symmetric. The palate elevates in the midline. Hearing intact. Voice is normal. Shoulder shrug is normal. The tongue has normal motion without fasciculations.   Coordination:    Normal finger to nose and heel to shin. Normal rapid alternating movements.   Gait:    Heel-toe and tandem gait are normal.   Motor Observation:    No asymmetry, no atrophy, and no involuntary movements noted. Tone:    Normal muscle tone.    Posture:    Posture is normal. normal erect    Strength:    Strength is V/V in the upper and lower limbs.      Sensation: intact to LT     Reflex Exam:  DTR's:    Deep tendon reflexes in the upper  and lower extremities are normal bilaterally.   Toes:    The toes are downgoing bilaterally.   Clonus:    Clonus is absent.    Assessment/Plan:  Patient with chronic migraines with aura, not intractable, without status. She has some basilar symptoms, she has had retinal migraines. Will start Verapamil. Discussed side effects which can include Dizziness, slow heartbeat, constipation, stomach upset, nausea, headache, and tiredness. If any of these effects persist or worsen or you experience anything else please stop medication and call us. Also gave him UpToDate patient handout on the medication.  Will try  Imitrex with alleve and zofran for acute management. Did discuss at length that  this medication is contraindicated in patients with hemiplegic or basilar type migraine due to risk of stroke or other vascular complications. However this is controversial and some studies do not should increased risk. Offered other acute management options. Patient acknowledges risks. If she has any side effects, would like her to stop and call us. Provided UpToDate patient drug information handout. Discussed teratogenicity.     Naomie Dean, MD  [facility] [address] [place], [place] [address]  Phone [phone] Fax [phone]

## 2015-08-19 NOTE — Telephone Encounter (Signed)
Called and spoke to pt. Advised her to come to office as soon as she could for appt. She stated she was on her way right now. Would like her to be here by 11:15 or before if possible. She verbalized understanding.

## 2015-08-19 NOTE — Patient Instructions (Addendum)
Remember to drink plenty of fluid, eat healthy meals and do not skip any meals. Try to eat protein with a every meal and eat a healthy snack such as fruit or nuts in between meals. Try to keep a regular sleep-wake schedule and try to exercise daily, particularly in the form of walking, 20-30 minutes a day, if you can.   As far as your medications are concerned, I would like to suggest:  Imitrex at onset of headache. Please take one tablet at the onset of your headache. If it does not improve the symptoms please take one additional tablet. Do not take more then 2 tablets in 24hrs. Do not take use more then 2 to 3 times in a week. zofran for nausea Start Verapamil as discussed  Our phone number is [phone]. We also have an after hours call service for urgent matters and there is a physician on-call for urgent questions. For any emergencies you know to call 911 or go to the nearest emergency room

## 2015-08-21 LAB — T3, FREE: T3, Free: 2.8 pg/mL (ref 2.3–4.2)

## 2015-08-21 LAB — TSH: TSH: 1.513 u[IU]/mL (ref 0.350–4.500)

## 2015-08-21 LAB — T4, FREE: Free T4: 1.34 ng/dL (ref 0.80–1.80)

## 2015-09-08 ENCOUNTER — Encounter: Payer: Self-pay | Admitting: *Deleted

## 2015-09-10 ENCOUNTER — Other Ambulatory Visit: Payer: Self-pay | Admitting: "Endocrinology

## 2015-09-29 ENCOUNTER — Other Ambulatory Visit: Payer: Self-pay | Admitting: "Endocrinology

## 2015-10-08 ENCOUNTER — Other Ambulatory Visit: Payer: Self-pay | Admitting: *Deleted

## 2015-10-08 ENCOUNTER — Telehealth: Payer: Self-pay | Admitting: Neurology

## 2015-10-08 DIAGNOSIS — E034 Atrophy of thyroid (acquired): Secondary | ICD-10-CM

## 2015-10-08 LAB — CBC WITH DIFFERENTIAL/PLATELET
BASOS ABS: 0 10*3/uL (ref 0.0–0.1)
Basophils Relative: 0 % (ref 0–1)
EOS PCT: 1 % (ref 0–5)
Eosinophils Absolute: 0 10*3/uL (ref 0.0–0.7)
HEMATOCRIT: 38.6 % (ref 36.0–46.0)
HEMOGLOBIN: 12.9 g/dL (ref 12.0–15.0)
LYMPHS ABS: 1.4 10*3/uL (ref 0.7–4.0)
LYMPHS PCT: 31 % (ref 12–46)
MCH: 29.1 pg (ref 26.0–34.0)
MCHC: 33.4 g/dL (ref 30.0–36.0)
MCV: 87.1 fL (ref 78.0–100.0)
MPV: 10.8 fL (ref 8.6–12.4)
Monocytes Absolute: 0.3 10*3/uL (ref 0.1–1.0)
Monocytes Relative: 6 % (ref 3–12)
NEUTROS ABS: 2.8 10*3/uL (ref 1.7–7.7)
Neutrophils Relative %: 62 % (ref 43–77)
Platelets: 181 10*3/uL (ref 150–400)
RBC: 4.43 MIL/uL (ref 3.87–5.11)
RDW: 12.9 % (ref 11.5–15.5)
WBC: 4.5 10*3/uL (ref 4.0–10.5)

## 2015-10-08 LAB — IRON: Iron: 97 ug/dL (ref 40–190)

## 2015-10-08 LAB — T4, FREE: FREE T4: 1.1 ng/dL (ref 0.80–1.80)

## 2015-10-08 LAB — T3, FREE: T3 FREE: 3 pg/mL (ref 2.3–4.2)

## 2015-10-08 LAB — TSH: TSH: 1.098 u[IU]/mL (ref 0.350–4.500)

## 2015-10-08 NOTE — Telephone Encounter (Signed)
Nuva Ring will not interfere with these medications, please let patient know thanks!

## 2015-10-08 NOTE — Telephone Encounter (Signed)
LVM to let pt know nuva ring will not interfere with any of the medications per Dr. Lucia GaskinsAhern. Told her to call back with any further questions. Gave [facility] phone number.

## 2015-10-08 NOTE — Telephone Encounter (Signed)
Patient is calling because she needs to discuss a new medication Nuva Ring that her OBGYN wants to start the patient on and wants to be sure if it will not interfere with her medications verapamil (VERELAN PM) 120 MG 24 hr capsule, SUMAtriptan (IMITREX) 100 MG tablet and ondansetron (ZOFRAN-ODT) 4 MG disintegrating tablet. Please call and advise. Thank you.

## 2015-10-15 ENCOUNTER — Ambulatory Visit (INDEPENDENT_AMBULATORY_CARE_PROVIDER_SITE_OTHER): Payer: BLUE CROSS/BLUE SHIELD | Admitting: "Endocrinology

## 2015-10-15 ENCOUNTER — Encounter: Payer: Self-pay | Admitting: "Endocrinology

## 2015-10-15 VITALS — BP 135/68 | HR 83 | Wt 181.0 lb

## 2015-10-15 DIAGNOSIS — G479 Sleep disorder, unspecified: Secondary | ICD-10-CM | POA: Insufficient documentation

## 2015-10-15 DIAGNOSIS — E038 Other specified hypothyroidism: Secondary | ICD-10-CM | POA: Diagnosis not present

## 2015-10-15 DIAGNOSIS — R5383 Other fatigue: Secondary | ICD-10-CM

## 2015-10-15 DIAGNOSIS — G43009 Migraine without aura, not intractable, without status migrainosus: Secondary | ICD-10-CM

## 2015-10-15 DIAGNOSIS — E063 Autoimmune thyroiditis: Secondary | ICD-10-CM | POA: Diagnosis not present

## 2015-10-15 DIAGNOSIS — E288 Other ovarian dysfunction: Secondary | ICD-10-CM

## 2015-10-15 DIAGNOSIS — E049 Nontoxic goiter, unspecified: Secondary | ICD-10-CM

## 2015-10-15 DIAGNOSIS — E2839 Other primary ovarian failure: Secondary | ICD-10-CM

## 2015-10-15 MED ORDER — LEVOTHYROXINE SODIUM 137 MCG PO TABS: 137.0000 ug | ORAL_TABLET | Freq: Every day | ORAL | Status: DC

## 2015-10-15 MED ORDER — TRAZODONE HCL 50 MG PO TABS: 50.0000 mg | ORAL_TABLET | Freq: Every evening | ORAL | Status: DC | PRN

## 2015-10-15 NOTE — Patient Instructions (Signed)
Follow up visit in 6 months. Please repeat thyroid blood tests in 3 months and 6 months. Please take 1/2 to one 50 mg trazodone tablets at bedtime as needed for sleep. Please contact Dr. Fransico MichaelBrennan about two weeks after starting the trazodone to discuss the results. Please call immediately if she thinks she is having any adverse effects.

## 2015-10-15 NOTE — Progress Notes (Signed)
CC: FU of hypothyroidism, secondary to Hashimoto's thyroiditis, goiter, and fatigue.  HPI: [name] is a 34 y.o. Caucasian woman. She was unaccompanied at today's visit.  1. [name] was diagnosed with hypothyroidism due to Hashimoto's Thyroiditis about September 2009, 6-7 months after the birth of her second child. She was started on levothyroxine. On 02/03/11 I saw her as a new patient. She felt better on Synthroid, but was still cold. She had a 20+ gram gland. The remainder of her exam was normal.  2. During the past four years we have gradually increased her dose of Synthroid to 137 mcg/day. Her symptoms of coldness and fatigue have improved, but not totally resolved. Her weight has fluctuated during this time.  3. Her last clinic visit was on 04/14/15.   A. In the interim she has had a "long 6 months".    1). Her last menstrual period was on July 20th. She was diagnosed with Premature Ovarian Insufficiency one month ago. She started on a Nuva ring a few weeks ago. She has a BMD study scheduled for later today.    2). She is having more classic migraine headaches, but fewer atypical migraines since starting verapamil. She is being followed by Dr. Lucia [name] at [facility].    3). She has not had insomnia, but has been awakening early even on nights when she does not get up to check her son, Nancy [name], for his T1DM. She has been taking melatonin for several weeks. The melatonin helps her fall asleeep, but is not helping her stay asleep. As a result she is tired and often irritable. She has one caffeinated drink per day.   B. She remains on Synthroid, 137 mcg daily.   C. Her mother has finished chemotherapy and XRT for lung cancer metastatic to the brain. Her cancer seems to be in remission.   4. Pertinent Review of Systems: Constitutional. The patient feels "tired". 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 Energy: Energy level is "low".    Body temperature: The patient's body temperature still tends to be a bit colder, but is better overall.  Weight: She had been trying to lose weight and had lost 6 pounds at her last visit. Since then, however, she has gained 10 pounds. Eyes: The patient's vision is good. There are no problems with soreness, bulging, or limited range of eye movements. Neck: The patient is not aware of any problems relating to the anterior neck and thyroid bed. There have been no significant problems with swelling, pain, soreness, tenderness, pressure, discomfort, or difficulty swallowing. Heart: The patient feels the expected increase in heart rate during exercise or other physical activities. There have been no significant problems with palpitations, irregular heart beats, chest pain, or chest pressure. Gastrointestinal: Stomach and intestines seem to be working normally. Bowel movements are normal. There are no significant complaints of excessive hunger, acid reflux, upset stomach, stomach aches or pains, diarrhea, or constipation. Musculoskeletal: Muscles and extremities appear to be working normally. There are no significant problems with hand tremor, sweaty palms, palmar erythema, or lower leg swelling. Psychological: She is more moody and more irritable. She is sometimes harder to live with than at other times.  Mental: The patient has had more problems with brain fog, mostly on days when she did not sleep well the night before. She has no other problems with her abilities to pay  attention, to remember, and to make decisions.      GYN: As above.   PAST MEDICAL, FAMILY, AND SOCIAL HISTORY: 1. Family and Work: She is still at home taking care of her children. 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 [place] in [place]. Her sister was recently diagnosed with hypothyroidism secondary to Hashimoto's thyroiditis. She has a grand aunt who had  parkinson's Dz. Her mother, who has lung cancer with mets to the brain and has had XRT of the brain, has developed some tremor now. 2. Activities: She had not been exercising since last visit, but recently started a line dancing program.   3. Tobacco, alcohol, and drugs: None 4. PCP: Dr. Merri Brunetteandace [name]  REVIEW OF SYSTEMS: [name] has no other significant issues involving her other body systems.  PHYSICAL EXAM: BP 135/68 mmHg  Pulse 83  Wt 181 lb (82.101 kg) She has gained 10 pounds.  Constitutional: The patient looks healthy, but appears tired. She is reasonably bright and alert, but not as vibrant as she usually is.  Head: I do not detect any head tremor today.    Eyes: There is no arcus or proptosis.  Mouth: The oropharynx appears normal. The tongue appears normal. There is normal oral moisture. There is no obvious gingivitis. Neck: There are no bruits present. The thyroid gland appears normal on inspection. The thyroid gland is slightly larger today at about 21 grams. The left lobe is mildly enlarged. The right lobe is top-normal size. The consistency of the thyroid gland is normal. There is no thyroid tenderness to palpation. Lungs: The lungs are clear. Air movement is good. Heart: The heart rhythm and rate appear normal. Heart sounds S1 and S2 are normal. I do not appreciate any pathologic heart murmurs. Abdomen: The abdomen is mildly enlarged. Bowel sounds are normal. The abdomen is soft and non-tender. There is no obviously palpable hepatomegaly, splenomegaly, or other masses.  Arms: Muscle mass appears appropriate for age.  Hands: She has no tremor today. Phalangeal and metacarpophalangeal joints appear normal. Palms are warm. She has no pallor of the fingers when she flexes her fingers. Legs: Muscle mass appears appropriate for age. There is no edema.  Neurologic: Muscle strength is normal for age and gender in both the upper and the lower extremities. Muscle tone appears  normal. Sensation to touch is normal in the legs.  LAB DATA:   10/08/15: TSH 1.098, free T4 1.10, free T3 3.0; CBC normal, iron 97  08/20/15: TSH 1.513, free T4 1.34, free T3 2.8  04/09/15: TSH 2.563, free T4 1.29, free T3 2.6; WBC 4.3, Hgb 13.3, Hct 39.9%; iron 95  10/07/14: TSH 2.478, free T4 1.39, free T3 2.8  04/03/14: TSH 4.281, free T4 1.27, free T3 3.0  10/04/13: TSH 2.393, free T4 1.34, free T3 2.9  03/16/13: TSH 2.862, free T4 1.14, free T3 2.7  01/22/13: TSH 4.261, free T4 1.14, free T3 2.9  06/21/12: TSH 2.427, free T4 1.11, free T3 3.1  03/20/12: TSH 2.980, free T4 1.14, free T3 3.1  ASSESSMENT: 1. Hypothyroid: Patient is chemically euthyroid. Clinically she has a mix of hyperthyroid and hypothyroid symptoms that are non-specific. She has had some fluctuations in her TFTs from September to October, c/w a recent flare up of thyroiditis. She is continuing to lose thyrocytes gradually but progressively, but she still has some thyrocytes that are functional.  2. Thyroiditis: Her thyroiditis is clinically quiescent today,  but intermittently active. 3. Goiter: Her goiter is slightly larger today. The waxing and waning of thyroid gland size over time is c/w evolving Hashimoto's disease.  4. Pallor: Resolved. 5. Fatigue: This problem is worse, apparently due to not sleeping well.   6. Head tremor: I do not see any evidence of head tremor today. 7. Tremor: I see no evidence of this today.   8. 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 With the addition of verapamil she has more classic migraines and fewer atypical migraines.   9. Premature ovarian insufficiency: This problem has become increasingly more common, paralleling the increasing incidence of autoimmune thyroid disease.   Plan:  1. Diagnostic: TFTs in 3 and 6 months. I put in the order for the 32-month and 10-month tests.  2. Therapeutic: Continue the  Synthroid dose of 137 mcg/day. Start Trazodone at a dose of 25-50 mg each evening on the nights when she does hot have "Nancy [name] duty".  3. Patient education: We discussed the fact that she is gradually losing thyroid cells over time. Therefore we will need to check her TFTs every 3-6 months and increase the dose of Synthroid as needed. We discussed the potential benefits and adverse effects of Trazodone. She will start the Trazodone at one-half pill. If she is doing well she will remain on that dose. If she needs a higher Trazodone dose she will increase the dose to 50 mg. She will call me in two weeks, or earlier, to discuss how the Trazodone is working for her.  4. Follow-up: FU in six months.   Level of Service: This visit lasted in excess of 50 minutes. More than 50% of the visit was devoted to counseling.  David Stall

## 2016-04-13 ENCOUNTER — Ambulatory Visit: Payer: BLUE CROSS/BLUE SHIELD | Admitting: "Endocrinology

## 2016-04-14 ENCOUNTER — Other Ambulatory Visit: Payer: Self-pay | Admitting: *Deleted

## 2016-04-14 DIAGNOSIS — E034 Atrophy of thyroid (acquired): Secondary | ICD-10-CM | POA: Diagnosis not present

## 2016-04-14 DIAGNOSIS — E038 Other specified hypothyroidism: Secondary | ICD-10-CM | POA: Diagnosis not present

## 2016-04-14 LAB — T4, FREE: FREE T4: 1.4 ng/dL (ref 0.8–1.8)

## 2016-04-14 LAB — T3, FREE: T3, Free: 2.6 pg/mL (ref 2.3–4.2)

## 2016-04-14 LAB — TSH: TSH: 2.9 m[IU]/L

## 2016-04-20 ENCOUNTER — Ambulatory Visit (INDEPENDENT_AMBULATORY_CARE_PROVIDER_SITE_OTHER): Payer: BLUE CROSS/BLUE SHIELD | Admitting: "Endocrinology

## 2016-04-20 ENCOUNTER — Encounter: Payer: Self-pay | Admitting: "Endocrinology

## 2016-04-20 VITALS — BP 130/84 | HR 79 | Wt 169.6 lb

## 2016-04-20 DIAGNOSIS — E038 Other specified hypothyroidism: Secondary | ICD-10-CM | POA: Diagnosis not present

## 2016-04-20 DIAGNOSIS — R5383 Other fatigue: Secondary | ICD-10-CM | POA: Diagnosis not present

## 2016-04-20 DIAGNOSIS — E049 Nontoxic goiter, unspecified: Secondary | ICD-10-CM

## 2016-04-20 DIAGNOSIS — R251 Tremor, unspecified: Secondary | ICD-10-CM

## 2016-04-20 DIAGNOSIS — G43109 Migraine with aura, not intractable, without status migrainosus: Secondary | ICD-10-CM

## 2016-04-20 DIAGNOSIS — E2839 Other primary ovarian failure: Secondary | ICD-10-CM

## 2016-04-20 DIAGNOSIS — E063 Autoimmune thyroiditis: Secondary | ICD-10-CM | POA: Diagnosis not present

## 2016-04-20 DIAGNOSIS — E288 Other ovarian dysfunction: Secondary | ICD-10-CM

## 2016-04-20 NOTE — Progress Notes (Signed)
CC: FU of hypothyroidism, secondary to Hashimoto's thyroiditis, goiter, and fatigue.  HPI: [name] is a 35 y.o. Caucasian woman. She was unaccompanied at today's visit.  1. [name] was diagnosed with hypothyroidism due to Hashimoto's Thyroiditis about September 2009, 6-7 months after the birth of her second child. She was started on levothyroxine. On 02/03/11 I saw her as a new patient. She felt better on Synthroid, but was still cold. She had a 20+ gram gland. The remainder of her exam was normal.  2. During the past five years we have gradually increased her dose of Synthroid to 137 mcg/day. Her symptoms of coldness and fatigue have improved, but not totally resolved. Her weight has fluctuated during this time.  3. Her last clinic visit was on 10/15/15.   A. In the interim she has felt much better physically and emotionally.     1). Her last regular menstrual period was on July 20th 2016. 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 She started on a Nuva ring then for three weeks per month. She has scanty periods in the weeks that she is off the Nuva ring.    2). She is having more classic migraine headaches, but no atypical migraines since starting verapamil. Her migraines occur on the weeks when she is off the Nuva ring. She is being followed by Nancy [name] at [facility].    3). She has been sleeping  better and now only takes trazodone a few nights per week. She still gets up to check her Nancy [name], Nancy [name], for his T1DM. She stopped taking melatonin due to its ineffectiveness. She has 1-2 caffeinated drink per day.   B. She remains on Synthroid, 137 mcg daily.   C. She works out 5-6 times per week.   D. Her mother finished chemotherapy and XRT for lung cancer metastatic to the brain about two years ago. Her cancer remains in remission.   4. Pertinent Review of Systems: Constitutional. The patient feels "tired". 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 . Energy: Energy level is "good".    Body temperature: The patient's body temperature still tends to be a bit colder, but is better overall.  Weight: She had been trying to lose weight and had lost 12 pounds since her last visit.  Eyes: The patient's vision is good. There are no problems with soreness, bulging, or limited range of eye movements. Neck: The patient is not aware of any problems relating to the anterior neck and thyroid bed. There have been no significant problems with swelling, pain, soreness, tenderness, pressure, discomfort, or difficulty swallowing. Heart: The patient feels the expected increase in heart rate during exercise or other physical activities. There have been no significant problems with palpitations, irregular heart beats, chest pain, or chest pressure. Gastrointestinal: Stomach and intestines seem to be working normally. Bowel movements are normal. There are no significant complaints of excessive hunger, acid reflux, upset stomach, stomach aches or pains, diarrhea, or constipation. Musculoskeletal: Muscles and extremities appear to be working normally. There are no significant problems with hand tremor, sweaty palms, palmar erythema, or lower leg swelling. Psychological: She is doing well.   Mental: The patient has not had much brain fog since her sleeping has improved. She has no problems with her abilities to pay attention, to remember, to think, and to make  decisions.      GYN: As above.   PAST MEDICAL, FAMILY, AND SOCIAL HISTORY: 1. Family and Work: She is still at home taking care of her children. 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 The [place] still lives in [place]. Her sister was recently diagnosed with hypothyroidism secondary to Hashimoto's thyroiditis. She has a grand aunt who had Parkinson's Dz.  2. Activities: She had  been exercising 5-6 days per week since last visit.  3. Tobacco, alcohol, and drugs: None 4. PCP: Dr. Merri Brunetteandace [name]  REVIEW OF SYSTEMS: [name] has no other significant issues involving her other body systems.  PHYSICAL EXAM: BP 130/84 mmHg  Pulse 79  Wt 169 lb 9.6 oz (76.93 kg) She has lost 12 pounds.  Constitutional: The patient looks healthy and quite normal. She is bright and alert. Her affect and insight are normal.  Head: I do not detect any head tremor today.    Eyes: There is no arcus or proptosis.  Mouth: The oropharynx appears normal. The tongue appears normal. There is normal oral moisture. There is no obvious gingivitis. Neck: There are no bruits present. The thyroid gland appears normal on inspection. The thyroid gland is slightly smaller today at about 20-21 grams. The left lobe is minimally enlarged. The right lobe is normal in size. The consistency of the thyroid gland is normal. There is no thyroid tenderness to palpation. Lungs: The lungs are clear. Air movement is good. Heart: The heart rhythm and rate appear normal. Heart sounds S1 and S2 are normal. I do not appreciate any pathologic heart murmurs. Abdomen: The abdomen is mildly enlarged. Bowel sounds are normal. The abdomen is soft and non-tender. There is no obviously palpable hepatomegaly, splenomegaly, or other masses.  Arms: Muscle mass appears appropriate for age.  Hands: She has no tremor today. Phalangeal and metacarpophalangeal joints appear normal. Palms are warm. She has no pallor of the fingers when she flexes her fingers. Legs: Muscle mass appears appropriate for age. There is no edema.  Neurologic: Muscle strength is normal for age and gender in both the upper and the lower extremities. Muscle tone appears normal. Sensation to touch is normal in the legs.  LAB DATA:   04/14/16: TSH 2.90, free T4 1.40, free T3 2.6  10/08/15: TSH 1.098, free T4 1.10, free T3 3.0; CBC normal, iron 97  08/20/15:  TSH 1.513, free T4 1.34, free T3 2.8  04/09/15: TSH 2.563, free T4 1.29, free T3 2.6; WBC 4.3, Hgb 13.3, Hct 39.9%; iron 95  10/07/14: TSH 2.478, free T4 1.39, free T3 2.8  04/03/14: TSH 4.281, free T4 1.27, free T3 3.0  10/04/13: TSH 2.393, free T4 1.34, free T3 2.9  03/16/13: TSH 2.862, free T4 1.14, free T3 2.7  01/22/13: TSH 4.261, free T4 1.14, free T3 2.9  06/21/12: TSH 2.427, free T4 1.11, free T3 3.1  03/20/12: TSH 2.980, free T4 1.14, free T3 3.1  ASSESSMENT: 1. Hypothyroid: Patient is chemically and clinically euthyroid. Although her TSH has increased since October 2016, her free T4 has also increased, c/w a recent flare up of thyroiditis. There is no need to change her Synthroid dose at this time, but we may need to increase the dose at her next visit.  2. Thyroiditis: Her thyroiditis is clinically quiescent today, but intermittently active. 3. Goiter: Her goiter is smaller today. The process of waxing and waning of thyroid gland size over time is c/w evolving Hashimoto's  disease.  4. Pallor: Resolved. 5. Fatigue: This problem is much improved, despite her being a very busy woman. It appears that the facts that she is sleeping better and is exercising more have combined to reduce her fatigue.   6. Head tremor: I do not see any evidence of head tremor today. 7. Tremor: I see no evidence of this today.   8. 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 With the addition of verapamil she has more classic migraines and fewer atypical migraines. The migraines seem to her to be hormonally related. She will see her neurologist soon. She may need a different form of estrogen.  9. Premature ovarian failure: This problem has become increasingly more common, paralleling the increasing incidence of autoimmune thyroid disease in the population at large.   Plan:  1. Diagnostic: TFTs in 3 and 6 months. I put in the orders for the  57-month and 43-month tests.  2. Therapeutic: Continue the Synthroid dose of 137 mcg/day. Continue Trazodone at doses of 25-50 mg in the evenings as needed.   3. Patient education: We discussed the fact that she is gradually losing thyroid cells over time. Therefore we will need to check her TFTs every 3-6 months and increase the dose of Synthroid as needed. She will continue to  adjust the dose and frequency of trazodone as needed. .  4. Follow-up: FU in seven months.   Level of Service: This visit lasted in excess of 45 minutes. More than 50% of the visit was devoted to counseling.  David Stall

## 2016-04-20 NOTE — Patient Instructions (Addendum)
Follow up visit in December 2017. Please repeat thyroid blood tests in 3 and 6-7 months.

## 2016-06-22 ENCOUNTER — Ambulatory Visit (INDEPENDENT_AMBULATORY_CARE_PROVIDER_SITE_OTHER): Payer: BLUE CROSS/BLUE SHIELD | Admitting: Neurology

## 2016-06-22 ENCOUNTER — Encounter: Payer: Self-pay | Admitting: Neurology

## 2016-06-22 VITALS — BP 128/81 | HR 80 | Ht 67.5 in | Wt 174.0 lb

## 2016-06-22 DIAGNOSIS — G43009 Migraine without aura, not intractable, without status migrainosus: Secondary | ICD-10-CM

## 2016-06-22 MED ORDER — TOPIRAMATE ER 100 MG PO CAP24: 100.0000 mg | ORAL_CAPSULE | Freq: Every day | ORAL | Status: DC

## 2016-06-22 NOTE — Patient Instructions (Addendum)
Overall you are doing fairly well but I do want to suggest a few things today:   Remember to drink plenty of fluid, eat healthy meals and do not skip any meals. Try to eat protein with a every meal and eat a healthy snack such as fruit or nuts in between meals. Try to keep a regular sleep-wake schedule and try to exercise daily, particularly in the form of walking, 20-30 minutes a day, if you can.   As far as your medications are concerned, I would like to suggest: Trokendi 25mg  one week 50mg  week two 100mg  week three and beyong  As far as diagnostic testing: None  I would like to see you back in 6 months, sooner if we need to. Please call us with any interim questions, concerns, problems, updates or refill requests.   Please also call us for any test results so we can go over those with you on the phone.  Topiramate extended-release capsules What is this medicine? TOPIRAMATE (toe PYRE a mate) is used to treat seizures in adults or children with epilepsy. This medicine may be used for other purposes; ask your health care provider or pharmacist if you have questions. What should I tell my health care provider before I take this medicine? They need to know if you have any of these conditions: -cirrhosis of the liver or liver disease -diarrhea -glaucoma -kidney stones or kidney disease -lung disease like asthma, obstructive pulmonary disease, emphysema -metabolic acidosis -on a ketogenic diet -scheduled for surgery or a procedure -suicidal thoughts, plans, or attempt; a previous suicide attempt by you or a family member -an unusual or allergic reaction to topiramate, other medicines, foods, dyes, or preservatives -pregnant or trying to get pregnant -breast-feeding How should I use this medicine? Take this medicine by mouth with a glass of water. Follow the directions on the prescription label. Trokendi XR capsules must be swallowed whole. Do not sprinkle on food, break, crush,  dissolve, or chew. Qudexy XR capsules may be swallowed whole or opened and sprinkled on a small amount of soft food. This mixture must be swallowed immediately. Do not chew or store mixture for later use. You may take this medicine with meals. Take your medicine at regular intervals. Do not take it more often than directed. Talk to your pediatrician regarding the use of this medicine in children. Special care may be needed. While Trokendi XR may be prescribed for children as young as 6 years and Qudexy XR may be prescribed for children as young as 2 years for selected conditions, precautions do apply. Overdosage: If you think you have taken too much of this medicine contact a poison control center or emergency room at once. NOTE: This medicine is only for you. Do not share this medicine with others. What if I miss a dose? If you miss a dose, take it as soon as you can. If it is almost time for your next dose, take only that dose. Do not take double or extra doses. What may interact with this medicine? Do not take this medicine with any of the following medications: -probenecid This medicine may also interact with the following medications: -acetazolamide -alcohol -amitriptyline -birth control pills -digoxin -hydrochlorothiazide -lithium -medicines for pain, sleep, or muscle relaxation -metformin -methazolamide -other seizure or epilepsy medicines -pioglitazone -risperidone This list may not describe all possible interactions. Give your health care provider a list of all the medicines, herbs, non-prescription drugs, or dietary supplements you use. Also tell them if you  smoke, drink alcohol, or use illegal drugs. Some items may interact with your medicine. What should I watch for while using this medicine? Visit your doctor or health care professional for regular checks on your progress. Do not stop taking this medicine suddenly. This increases the risk of seizures if you are using this  medicine to control epilepsy. Wear a medical identification bracelet or chain to say you have epilepsy or seizures, and carry a card that lists all your medicines. This medicine can decrease sweating and increase your body temperature. Watch for signs of deceased sweating or fever, especially in children. Avoid extreme heat, hot baths, and saunas. Be careful about exercising, especially in hot weather. Contact your health care provider right away if you notice a fever or decrease in sweating. You should drink plenty of fluids while taking this medicine. If you have had kidney stones in the past, this will help to reduce your chances of forming kidney stones. If you have stomach pain, with nausea or vomiting and yellowing of your eyes or skin, call your doctor immediately. You may get drowsy, dizzy, or have blurred vision. Do not drive, use machinery, or do anything that needs mental alertness until you know how this medicine affects you. To reduce dizziness, do not sit or stand up quickly, especially if you are an older patient. Alcohol can increase drowsiness and dizziness. Avoid alcoholic drinks. Do not drink alcohol for 6 hours before or 6 hours after taking Trokendi XR. If you notice blurred vision, eye pain, or other eye problems, seek medical attention at once for an eye exam. The use of this medicine may increase the chance of suicidal thoughts or actions. Pay special attention to how you are responding while on this medicine. Any worsening of mood, or thoughts of suicide or dying should be reported to your health care professional right away. This medicine may increase the chance of developing metabolic acidosis. If left untreated, this can cause kidney stones, bone disease, or slowed growth in children. Symptoms include breathing fast, fatigue, loss of appetite, irregular heartbeat, or loss of consciousness. Call your doctor immediately if you experience any of these side effects. Also, tell your  doctor about any surgery you plan on having while taking this medicine since this may increase your risk for metabolic acidosis. Birth control pills may not work properly while you are taking this medicine. Talk to your doctor about using an extra method of birth control. Women who become pregnant while using this medicine may enroll in the [place] Antiepileptic Drug Pregnancy Registry by calling [phone]. This registry collects information about the safety of antiepileptic drug use during pregnancy. What side effects may I notice from receiving this medicine? Side effects that you should report to your doctor or health care professional as soon as possible: -allergic reactions like skin rash, itching or hives, swelling of the face, lips, or tongue -decreased sweating and/or rise in body temperature -depression -difficulty breathing, fast or irregular breathing patterns -difficulty speaking -difficulty walking or controlling muscle movements -hearing impairment -redness, blistering, peeling or loosening of the skin, including inside the mouth -tingling, pain or numbness in the hands or feet -unusually weak or tired -worsening of mood, thoughts or actions of suicide or dying Side effects that usually do not require medical attention (Report these to your doctor or health care professional if they continue or are bothersome.): -altered taste -back pain, joint or muscle aches and pains -diarrhea, or constipation -headache -loss of appetite -nausea -stomach  upset, indigestion -tremors This list may not describe all possible side effects. Call your doctor for medical advice about side effects. You may report side effects to FDA at 1-800-FDA-1088. Where should I keep my medicine? Keep out of the reach of children. Store at room temperature between 15 and 30 degrees C (59 and 86 degrees F) in a tightly closed container. Protect from moisture. Throw away any unused medicine after the  expiration date. NOTE: This sheet is a summary. It may not cover all possible information. If you have questions about this medicine, talk to your doctor, pharmacist, or health care provider.    2016, Elsevier/Gold Standard. (2013-02-27 15:33:26)   My clinical assistant and will answer any of your questions and relay your messages to me and also relay most of my messages to you.   Our phone number is [phone]. We also have an after hours call service for urgent matters and there is a physician on-call for urgent questions. For any emergencies you know to call 911 or go to the nearest emergency room

## 2016-06-22 NOTE — Progress Notes (Signed)
[facility]    Provider:  Dr Lucia Gaskins Referring Provider: [name], MD Primary Care Physician:  Allean Found, MD  CC: migraines  Interval history 06/22/2016:  Very nice 35 year old patient here for follow-up of migraines. Last seen almost a year ago, verapamil did not help her headaches, however the combination of Imitrex, and antiemetic and anti-inflammatory medication works great for acute management of migraines. She was recently diagnosed with ovarian failure. The migraines have changed since managing her ovarian failure, She gets migraines the week of her menses every day for a week and then a few other migraines the other weeks. Possibly 9-10 migraines a month. Verapamil didn't help. Husband had a vasectomy and she diagnosed with POI so no more kids. She tried Topamax in the past and it made her very tired and she would sleeps through hearing her children in the middle of the night. She is willing to try Topamax again. We discussed all the other options including other classes of medications however Topamax is a great migraine preventative and she is willing to try again we can try the extended release version which has improved side effects.Discussed other classes of medications and side effects, she decide to try Topamax.  Tried: Topamax. Did not tolerate Topamax IR. Failed verapamil.    HPI: 35 year old female with Migrains. PMHx hypothyroidism due to Hashimoto's Thyroiditis, fatigue and tremor. She has had migraines since the age of 35. It was a cleaning product that caused it, resolved with changes in environmental factors and since then they come and go. They go in cycles, has them for a while then go away. Worsening the last 3 years. 3 migraines a week now. She tried Topamax and could not tolerate. Took fioricet in the past for acute management. Pain is on the right side of the head. Dizziness. Light headedness. Weakness in the arms after the headaches or  during the headaches. Maybe numbness, like they are falling asleep. Left > right side affected. She had a head tremor for a few months that resolved. She had vertigo with her headaches a few weeks ago. She has had 3 occular migraines in her lifetime. The headaches last all day if she wakes up with it. If headache starts is in the afternoon, it may last 6 hours if she sleeps. Throbbing, light and sound sensitivity, nausea but rare vomiting.Dark room staying still helps. 7-8/10 at its worse. No auras. She feels mentally foggy with the migraines. No insomnia. No other focal neurologic deficits. Aunt with Parkinsons Dz.   Reviewed notes, labs and imaging from outside physicians, which showed: CMP 07/2015 unremarkable Creatinine 0.71. CBC nml, TSH nml, LDL 74,   Personally reviewed MRI brain:  Ventricle size normal. Cerebral volume normal. Low lying cerebellar tonsils approximately 5 mm without Chiari malformation. Pituitary normal in size. Negative for acute or chronic infarction Negative for demyelinating disease. Cerebral white matter is normal. Brainstem and basal ganglia are normal. Negative for hemorrhage or mass lesion. Normal enhancement following contrast infusion. Paranasal sinuses are clear. IMPRESSION: Normal   Review of Systems: Patient complains of symptoms per HPI as well as the following symptoms: Fatigue, light sensitivity, cold sensitivity, nausea, food allergies, headache, weakness, daytime sleepiness. Pertinent negatives per HPI. All others negative.   Social History   Social History  . Marital Status: Married    Spouse Name: Seymour Bars  . Number of Children: 2  . Years of Education: 16   Occupational History  . 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 Smoking status: Never Smoker   . Smokeless tobacco: Not on file  . Alcohol Use: No  . Drug Use: No  . Sexual Activity: Not on file   Other Topics Concern  . Not on file   Social History Narrative    Lives at home with husband Seymour Bars and 2 children.    Caffeinse use: 2 cups coffee per day    Family History  Problem Relation Age of Onset  . Diabetes Son   . Hypothyroidism Son   . Stroke Maternal Grandmother   . Heart disease Maternal Grandfather   . Migraines Neg Hx     Past Medical History  Diagnosis Date  . Hypothyroidism, acquired, autoimmune   . Thyroiditis, autoimmune     Hashimotos   . Fatigue   . Eczema   . Migraines     Past Surgical History  Procedure Laterality Date  . Mole removal  2010  . Tongue growth  2010  . Wisdom tooth extraction  1999    Current Outpatient Prescriptions  Medication Sig Dispense Refill  . cetirizine (ZYRTEC) 10 MG tablet Take 10 mg by mouth daily as needed.     . etonogestrel-ethinyl estradiol (NUVARING) 0.12-0.015 MG/24HR vaginal ring Place 1 each vaginally every 28 (twenty-eight) days. Insert vaginally and leave in place for 3 consecutive weeks, then remove for 1 week.    . levothyroxine (SYNTHROID, LEVOTHROID) 137 MCG tablet Take 1 tablet (137 mcg total) by mouth daily. 30 tablet 12  . Melatonin 5 MG TABS Take 10 mg by mouth.    . naproxen (NAPROSYN) 500 MG tablet Take 500 mg by mouth 2 (two) times daily with a meal.    . ondansetron (ZOFRAN-ODT) 4 MG disintegrating tablet Take 1 tablet (4 mg total) by mouth every 8 (eight) hours as needed for nausea. 30 tablet 11  . SUMAtriptan (IMITREX) 100 MG tablet Take 1 tablet (100 mg total) by mouth once. May repeat in 2 hours if headache persists or recurs. 10 tablet 11   No current facility-administered medications for this visit.    Allergies as of 06/22/2016 - Review Complete 06/22/2016  Allergen Reaction Noted  . Codeine  04/07/2011    Vitals: BP 128/81 mmHg  Pulse 80  Ht 5' 7.5" (1.715 m)  Wt 174 lb (78.926 kg)  BMI 26.83 kg/m2 Last Weight:  Wt Readings from Last 1 Encounters:  06/22/16 174 lb (78.926 kg)   Last Height:   Ht Readings from Last 1 Encounters:  06/22/16 5'  7.5" (1.715 m)       Speech:  Speech is normal; fluent and spontaneous with normal comprehension.  Cognition:  The patient is oriented to person, place, and time;   recent and remote memory intact;   language fluent;   normal attention, concentration,   fund of knowledge Cranial Nerves:  The pupils are equal, round, and reactive to light. The fundi are normal and spontaneous venous pulsations are present. Visual fields are full to finger confrontation. Extraocular movements are intact. Trigeminal sensation is intact and the muscles of mastication are normal. The face is symmetric. The palate elevates in the midline. Hearing intact. Voice is normal. Shoulder shrug is normal. The tongue has normal motion without fasciculations.   Coordination:  Normal finger to nose and heel to shin. Normal rapid alternating movements.   Gait:  Heel-toe and tandem gait are normal.   Motor Observation:  No asymmetry, no atrophy, and no involuntary movements noted.  Tone:  Normal muscle tone.   Posture:  Posture is normal. normal erect   Strength:  Strength is V/V in the upper and lower limbs.    Sensation: intact to LT   Reflex Exam:  DTR's:  Deep tendon reflexes in the upper and lower extremities are normal bilaterally.  Toes:  The toes are downgoing bilaterally.  Clonus:  Clonus is absent.    Assessment/Plan: Patient with chronic migraines with aura, not intractable, without status. She has some basilar symptoms, she has had retinal migraines. Will start Verapamil. Discussed side effects which can include Dizziness, slow heartbeat, constipation, stomach upset, nausea, headache, and tiredness. If any of these effects persist or worsen or you experience anything else please stop medication and call us. Also gave him UpToDate patient handout on the medication.   - Verapamil didn't work.  - Remember to drink plenty of fluid, eat healthy  meals and do not skip any meals. Try to eat protein with a every meal and eat a healthy snack such as fruit or nuts in between meals. Try to keep a regular sleep-wake schedule and try to exercise daily, particularly in the form of walking, 20-30 minutes a day, if you can.   As far as your medications are concerned, I would like to suggest: Trokendi 25mg  one week 50mg  week two 100mg  week three and beyong  As far as diagnostic testing: None  I would like to see you back in 6 months, sooner if we need to. Please call us with any interim questions, concerns, problems, updates or refill requests.   Please also call us for any test results so we can go over those with you on the phone.  Discussed to prevent or relieve headaches, try the following: Cool Compress. Lie down and place a cool compress on your head.  Avoid headache triggers. If certain foods or odors seem to have triggered your migraines in the past, avoid them. A headache diary might help you identify triggers.  Include physical activity in your daily routine. Try a daily walk or other moderate aerobic exercise.  Manage stress. Find healthy ways to cope with the stressors, such as delegating tasks on your to-do list.  Practice relaxation techniques. Try deep breathing, yoga, massage and visualization.  Eat regularly. Eating regularly scheduled meals and maintaining a healthy diet might help prevent headaches. Also, drink plenty of fluids.  Follow a regular sleep schedule. Sleep deprivation might contribute to headaches Consider biofeedback. With this mind-body technique, you learn to control certain bodily functions - such as muscle tension, heart rate and blood pressure - to prevent headaches or reduce headache pain.    Proceed to emergency room if you experience new or worsening symptoms or symptoms do not resolve, if you have new neurologic symptoms or if headache is severe, or for any concerning symptom.    Will continue Imitrex  with alleve and zofran for acute management. Did discuss at length that this medication is contraindicated in patients with hemiplegic or basilar type migraine due to risk of stroke or other vascular complications. However this is controversial and some studies do not should increased risk. Offered other acute management options. Patient acknowledges risks. If she has any side effects, would like her to stop and call us. Provided UpToDate patient drug information handout. Discussed teratogenicity.   Naomie Dean, MD  [facility] [address] [place], [place] [address]  Phone [phone] Fax [phone]  A total of 30 minutes was spent face-to-face  with this patient. Over half this time was spent on counseling patient on the migraine diagnosis and different diagnostic and therapeutic options available.

## 2016-07-03 IMAGING — MR MR HEAD WO/W CM
9 of 12 series · 35 of 48 positions shown · IV contrast (multihance)
Comparison: None.

CLINICAL DATA: Migraine headache.  Tremor.  Right arm weakness

EXAM:
MRI HEAD WITHOUT AND WITH CONTRAST
TECHNIQUE: Multiplanar, multiecho pulse sequences of the brain and surrounding
structures were obtained without and with intravenous contrast.
CONTRAST:  15mL MULTIHANCE GADOBENATE DIMEGLUMINE 529 MG/ML IV SOLN

[Series 3: FLAIR · sagittal · 5.0mm · 0.47mm/px · 3 of 26 slices shown (1 of 2)]
[im 1/26]
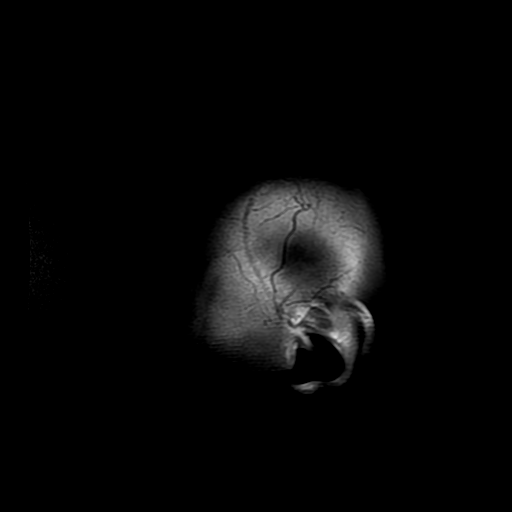
[im 13/26]
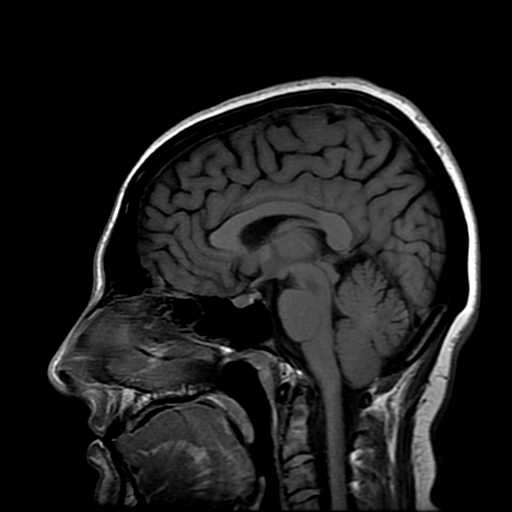
[im 26/26]
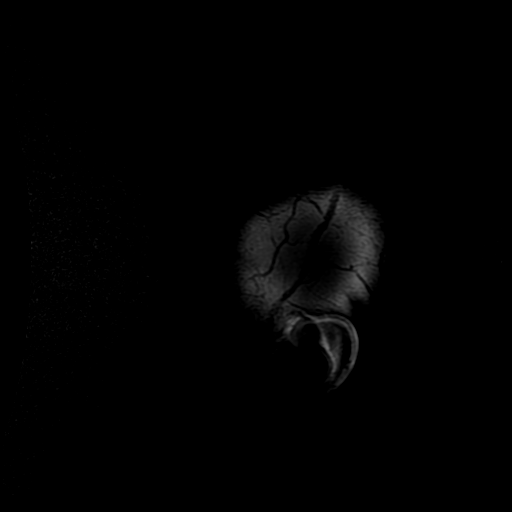

[Series 4: DWI · axial · 3.0mm · 1.09mm/px · z∈[-39,+105]mm · 9 of 98 slices shown (1 of 4)]
[im 1/98]
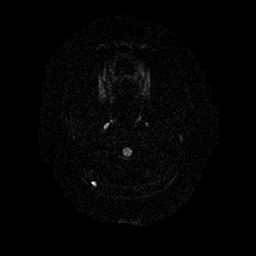
[im 13/98]
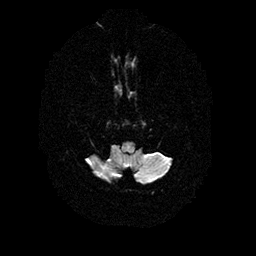
[im 25/98]
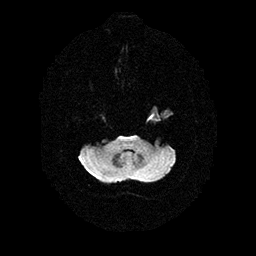
[im 37/98]
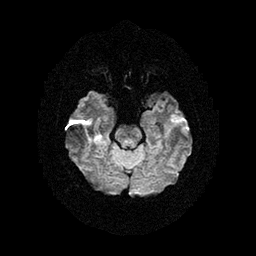
[im 49/98]
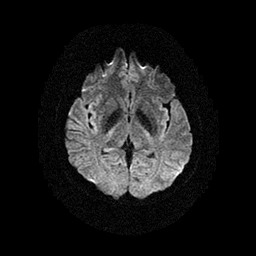
[im 61/98]
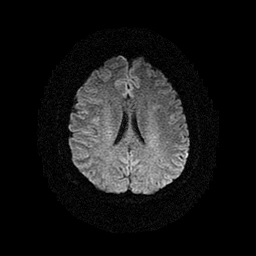
[im 73/98]
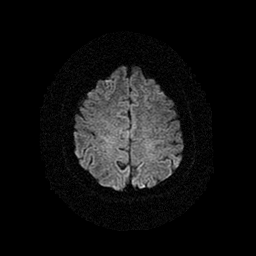
[im 85/98]
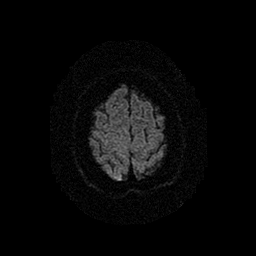
[im 98/98]
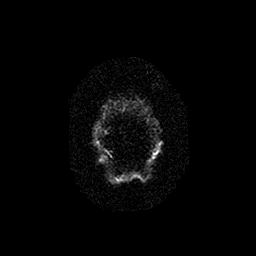

[Series 5: DWI · coronal · 5.0mm · 1.09mm/px · 6 of 66 slices shown (2 of 4)]
[im 1/66]
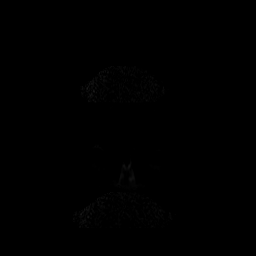
[im 14/66]
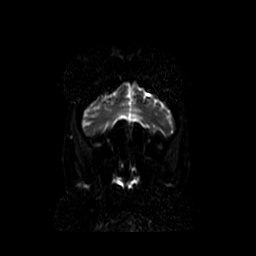
[im 27/66]
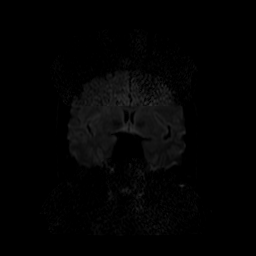
[im 40/66]
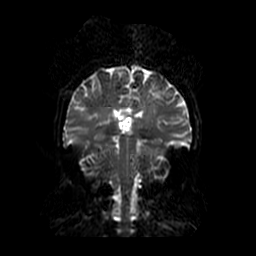
[im 53/66]
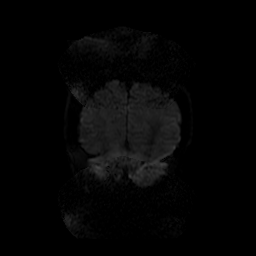
[im 66/66]
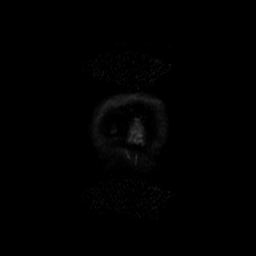

[Series 6: T2-star · axial · 5.0mm · 0.43mm/px · 1 of 26 slices shown]
[im 1/26]
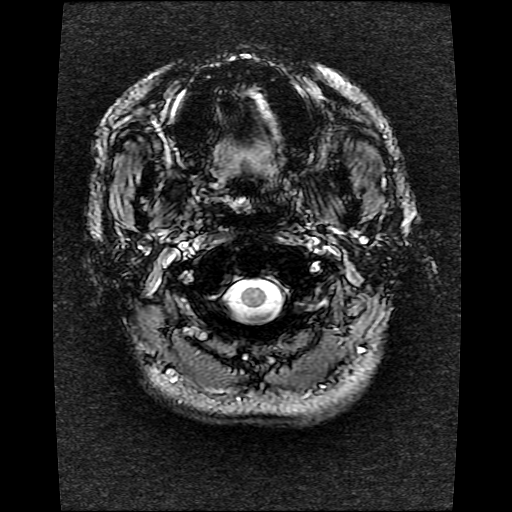

[Series 8: FLAIR · axial · 5.0mm · 0.43mm/px · z∈[-41,+109]mm · 2 of 26 slices shown (2 of 2)]
[im 1/26]
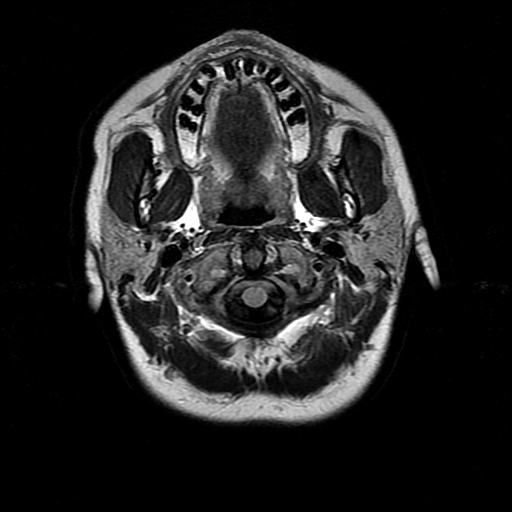
[im 26/26]
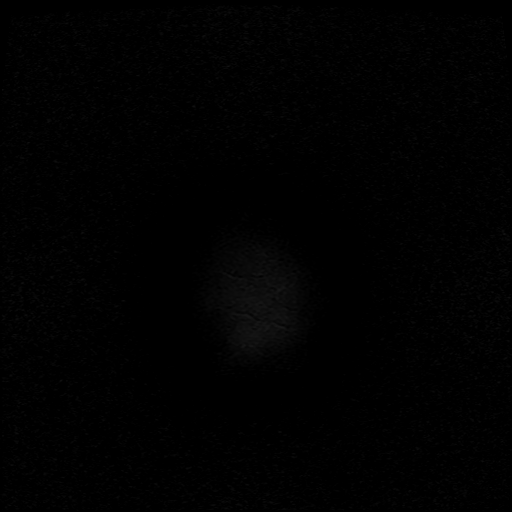

[Series 10: T2 post-contrast · coronal · 5.0mm · 0.43mm/px · 3 of 27 slices shown]
[im 1/27]
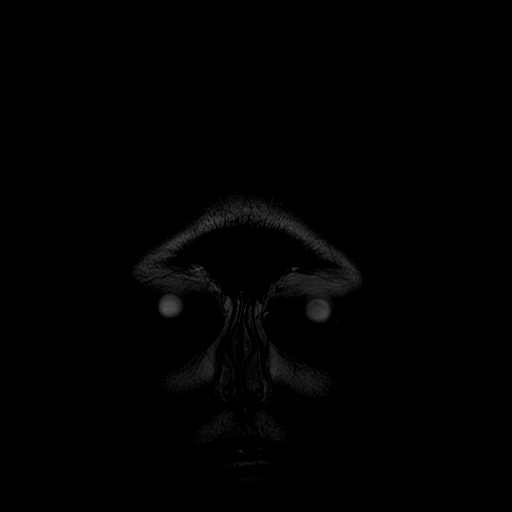
[im 14/27]
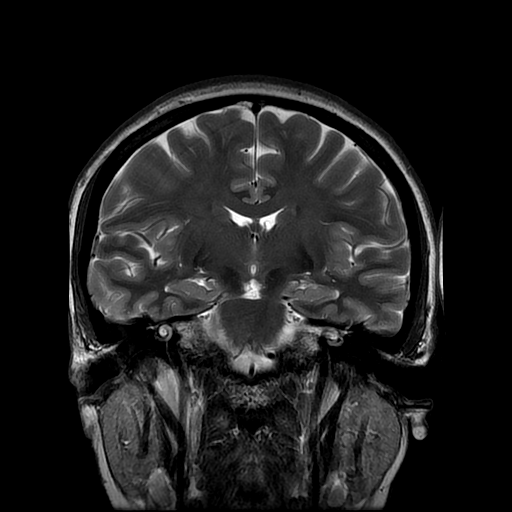
[im 27/27]
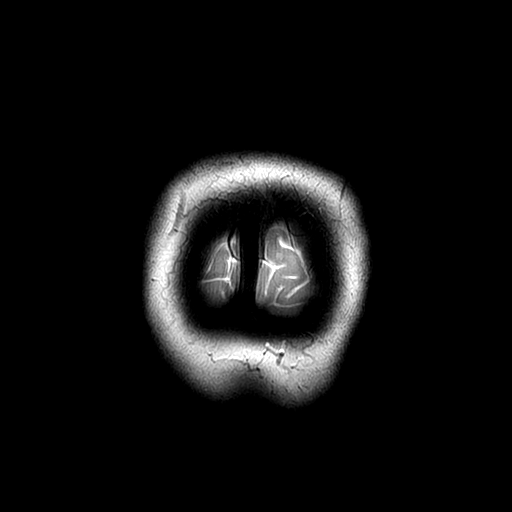

[Series 12: T1 · coronal · 5.0mm · 0.43mm/px · 3 of 27 slices shown]
[im 1/27]
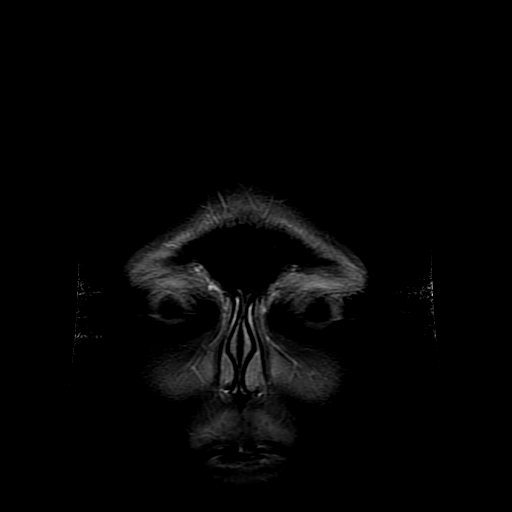
[im 14/27]
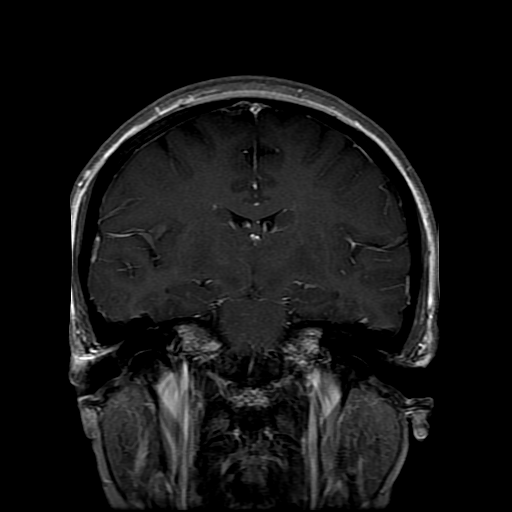
[im 27/27]
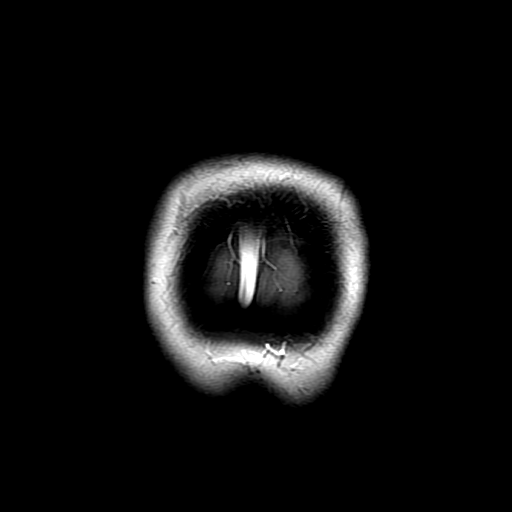

[Series 400: DWI · axial · 3.0mm · 1.09mm/px · z∈[-39,+105]mm · 5 of 49 slices shown (3 of 4)]
[im 1/49]
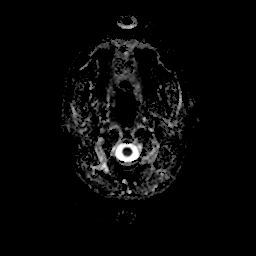
[im 13/49]
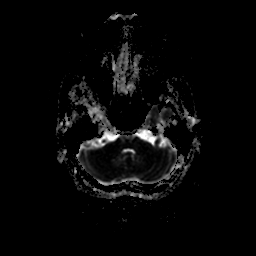
[im 25/49]
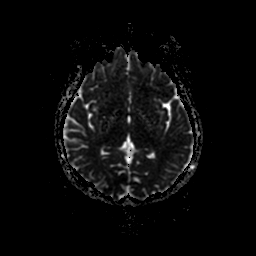
[im 37/49]
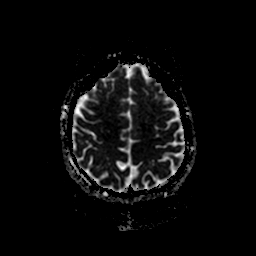
[im 49/49]
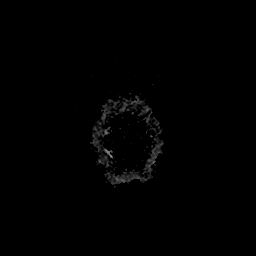

[Series 500: DWI · coronal · 5.0mm · 1.09mm/px · 3 of 33 slices shown (4 of 4)]
[im 1/33]
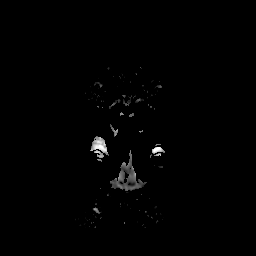
[im 17/33]
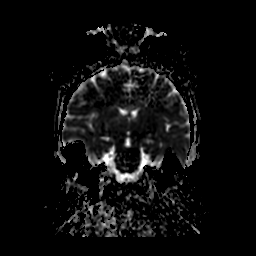
[im 33/33]
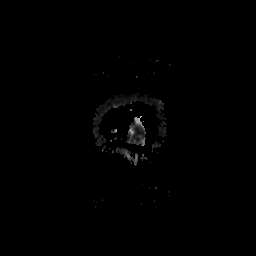

[35 of 48 positions shown; findings below may reference images not displayed]

FINDINGS: Ventricle size normal. Cerebral volume normal. Low lying cerebellar
tonsils approximately 5 mm without Chiari malformation. Pituitary
normal in size.

Negative for acute or chronic infarction

Negative for demyelinating disease. Cerebral white matter is normal.
Brainstem and basal ganglia are normal.

Negative for hemorrhage or mass lesion

Normal enhancement following contrast infusion.

Paranasal sinuses are clear.
IMPRESSION: Normal

## 2016-07-12 ENCOUNTER — Encounter: Payer: Self-pay | Admitting: Neurology

## 2016-07-13 ENCOUNTER — Other Ambulatory Visit: Payer: Self-pay | Admitting: Neurology

## 2016-07-13 DIAGNOSIS — G43009 Migraine without aura, not intractable, without status migrainosus: Secondary | ICD-10-CM

## 2016-07-13 MED ORDER — TOPIRAMATE ER 100 MG PO CAP24: 100.0000 mg | ORAL_CAPSULE | Freq: Every day | ORAL | 11 refills | Status: DC

## 2016-07-13 MED ORDER — ONDANSETRON 4 MG PO TBDP
4.0000 mg | ORAL_TABLET | Freq: Three times a day (TID) | ORAL | 11 refills | Status: DC | PRN
Start: 2016-07-13 — End: 2017-08-22

## 2016-07-13 MED ORDER — SUMATRIPTAN SUCCINATE 100 MG PO TABS: 100.0000 mg | ORAL_TABLET | Freq: Once | ORAL | 11 refills | Status: DC

## 2016-07-19 ENCOUNTER — Telehealth: Payer: Self-pay | Admitting: *Deleted

## 2016-07-19 NOTE — Telephone Encounter (Signed)
Called and LVM for pt to call back. Advised I am working on PA for Trokendi XR and need to know what dose of topamax IR and verapamil that she tried/failed and how long she took each. Gave [facility] phone number for her to call back.

## 2016-07-20 NOTE — Telephone Encounter (Signed)
Called pt. Advised PA approved via insurance for Trokendi XR. Patient verbalized understanding.

## 2016-07-20 NOTE — Telephone Encounter (Signed)
Submitted PA request for Trokendi XR on covermymeds. Awaiting reply. States it can take about 3 business days for a reply.

## 2016-07-20 NOTE — Telephone Encounter (Signed)
Patient called office back. I relayed that I am working on PA for trokendi XR and needed more information on how long she took both topamax IR and verapamil. She stated that took the topamax IR from Feb 2014-Oct 2014 and was compliant. She took everyday. She stated she had SE from med. She was too drowsy and could not tolerate med.  She took the verapamil from Sep 2016 to May 2017. She also had SE from this medication. She could not stay asleep at night.  Advised I will keep her updated on PA. She verbalized understanding.   I also spoke to her about Trokendi XR copay card. Advised she can potentially get prescription for $0 for the next 12 prescriptions. She needs to make sure her pharmacy follows directions on card. They can contact  [facility] are having problems. She will pick up card at our office. I advised I will place up front for her to pick up.

## 2016-07-20 NOTE — Telephone Encounter (Signed)
Received notice from covermymeds that PA for Trokendi XR approved via insurance.  Effective from 07/20/2016 through 12/12/2038.

## 2016-07-20 NOTE — Telephone Encounter (Signed)
Patient returned Emma's call ° °

## 2016-08-17 DIAGNOSIS — E038 Other specified hypothyroidism: Secondary | ICD-10-CM | POA: Diagnosis not present

## 2016-08-18 LAB — TSH: TSH: 2.34 mIU/L

## 2016-08-18 LAB — T4, FREE: Free T4: 1.4 ng/dL (ref 0.8–1.8)

## 2016-08-18 LAB — T3, FREE: T3, Free: 2.4 pg/mL (ref 2.3–4.2)

## 2016-08-25 DIAGNOSIS — G44219 Episodic tension-type headache, not intractable: Secondary | ICD-10-CM | POA: Diagnosis not present

## 2016-08-26 DIAGNOSIS — Z23 Encounter for immunization: Secondary | ICD-10-CM | POA: Diagnosis not present

## 2016-08-26 DIAGNOSIS — Z01419 Encounter for gynecological examination (general) (routine) without abnormal findings: Secondary | ICD-10-CM | POA: Diagnosis not present

## 2016-08-30 ENCOUNTER — Encounter: Payer: Self-pay | Admitting: *Deleted

## 2016-10-28 ENCOUNTER — Other Ambulatory Visit: Payer: Self-pay | Admitting: "Endocrinology

## 2016-10-28 DIAGNOSIS — E063 Autoimmune thyroiditis: Secondary | ICD-10-CM

## 2016-11-16 DIAGNOSIS — E038 Other specified hypothyroidism: Secondary | ICD-10-CM | POA: Diagnosis not present

## 2016-11-16 LAB — TSH: TSH: 2.85 m[IU]/L

## 2016-11-16 LAB — T3, FREE: T3, Free: 2.6 pg/mL (ref 2.3–4.2)

## 2016-11-16 LAB — T4, FREE: Free T4: 1.2 ng/dL (ref 0.8–1.8)

## 2016-11-22 ENCOUNTER — Ambulatory Visit (INDEPENDENT_AMBULATORY_CARE_PROVIDER_SITE_OTHER): Payer: BLUE CROSS/BLUE SHIELD | Admitting: "Endocrinology

## 2016-11-22 ENCOUNTER — Encounter (INDEPENDENT_AMBULATORY_CARE_PROVIDER_SITE_OTHER): Payer: Self-pay | Admitting: "Endocrinology

## 2016-11-22 VITALS — BP 118/78 | HR 82 | Wt 176.4 lb

## 2016-11-22 DIAGNOSIS — E063 Autoimmune thyroiditis: Secondary | ICD-10-CM | POA: Diagnosis not present

## 2016-11-22 DIAGNOSIS — G43501 Persistent migraine aura without cerebral infarction, not intractable, with status migrainosus: Secondary | ICD-10-CM | POA: Diagnosis not present

## 2016-11-22 DIAGNOSIS — E049 Nontoxic goiter, unspecified: Secondary | ICD-10-CM

## 2016-11-22 DIAGNOSIS — R5383 Other fatigue: Secondary | ICD-10-CM | POA: Diagnosis not present

## 2016-11-22 DIAGNOSIS — E038 Other specified hypothyroidism: Secondary | ICD-10-CM | POA: Diagnosis not present

## 2016-11-22 MED ORDER — LEVOTHYROXINE SODIUM 137 MCG PO TABS: ORAL_TABLET | ORAL | 6 refills | Status: DC

## 2016-11-22 NOTE — Patient Instructions (Signed)
Follow up visit in 6 months. Please repeat lab tests in 3 and 6 months.  

## 2016-11-22 NOTE — Progress Notes (Signed)
CC: FU of hypothyroidism, secondary to Hashimoto's thyroiditis, goiter, pallor, and fatigue.  HPI: Nancy [name] is a 35 y.o. Caucasian woman. She was accompanied by her husband today.  1. Nancy [name] was diagnosed with hypothyroidism due to Hashimoto's Thyroiditis about September 2009, 6-7 months after the birth of her second child. She was started on levothyroxine. On 02/03/11 I saw her as a new patient. She felt better on Synthroid, but was still cold. She had a 20+ gram gland. The remainder of her exam was normal.  2. During the past five years we have gradually increased her dose of Synthroid to 137 mcg/day. Her symptoms of coldness and fatigue have improved, but not totally resolved. Her weight has fluctuated during this time.  3. Her last clinic visit was on 04/20/16.   A. In the interim she has been healthy, but has been more tired lately.      1). Her last regular menstrual period was on July 20th 2016. 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    2). She is having fewer and less severe migraines now, but the migraines still occur on the weeks when she is off the Nuva ring. She is being followed [facility].    3). She is sleeping well. She no longer takes trazodone. She still gets up to check her son, Cornelius MorasOwen, for his T1DM. She has 1-2 caffeinated drink per day.   B. She remains on Synthroid, 137 mcg daily.   C. She stopped working out 5-6 times per week, but recently started zumba.    D. Her mother finished chemotherapy and XRT for lung cancer metastatic to the brain about two years ago. Her cancer remains in remission.   4. Pertinent Review of Systems: Constitutional. The patient feels "good, but tired at times". 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 Energy: Energy level is "fairly good".    Body temperature: The patient's body temperature still tends to  be colder, but is better overall.  Weight: She has gained 7 pounds.   Eyes: The patient's vision is good. There are no problems with soreness, bulging, or limited range of eye movements. Neck: The patient is not aware of any problems relating to the anterior neck and thyroid bed. There have been no significant problems with swelling, pain, soreness, tenderness, pressure, discomfort, or difficulty swallowing. Heart: The patient feels the expected increase in heart rate during exercise or other physical activities. There have been no significant problems with palpitations, irregular heart beats, chest pain, or chest pressure. Gastrointestinal: Stomach and intestines seem to be working normally. Bowel movements are normal. There are no significant complaints of excessive hunger, acid reflux, upset stomach, stomach aches or pains, diarrhea, or constipation. Musculoskeletal: Muscles and extremities appear to be working normally. There are no significant problems with hand tremor, sweaty palms, palmar erythema, or lower leg swelling. Psychological: She is doing well.   Mental: Her brain fog resolved once she began sleeping normally. She has no problems with her abilities to pay attention, to remember, to think, and to make decisions.      GYN: As above.   PAST MEDICAL, FAMILY, AND SOCIAL HISTORY: 1. Family and Work: She is still at home taking care of her children. 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 [place] still [place] in [place], [place]. Her sister was  diagnosed with hypothyroidism secondary to Hashimoto's thyroiditis. She has a grand aunt who had Parkinson's Dz.  2. Activities: She had not exercised since September.   3. Tobacco, alcohol, and drugs: None 4. PCP: Dr. [name]  REVIEW OF SYSTEMS: Ms. wrigley winborne has no other significant issues involving her other body systems.  PHYSICAL EXAM: BP 118/78   Pulse 82   Wt 176 lb 6.4 oz (80 kg)   BMI 27.22 kg/m  She has  gained 7 pounds.   Constitutional: The patient looks healthy and quite normal. She is bright and alert. Her affect and insight are normal.  Head: I do not detect any head tremor today.    Eyes: There is no arcus or proptosis.  Mouth: The oropharynx appears normal. The tongue appears normal. There is normal oral moisture. There is no obvious gingivitis. Neck: There are no bruits present. The thyroid gland appears normal on inspection. The thyroid gland is slightly larger at 21 grams. The left lobe is mildly enlarged. The right lobe is normal in size. The consistency of the thyroid gland is normal. There is no thyroid tenderness to palpation. Lungs: The lungs are clear. Air movement is good. Heart: The heart rhythm and rate appear normal. Heart sounds S1 and S2 are normal. I do not appreciate any pathologic heart murmurs. Abdomen: The abdomen is mildly enlarged. Bowel sounds are normal. The abdomen is soft and non-tender. There is no obviously palpable hepatomegaly, splenomegaly, or other masses.  Arms: Muscle mass appears appropriate for age.  Hands: She has no tremor today. Phalangeal and metacarpophalangeal joints appear normal. Palms are warm. She has no pallor of the fingers when she flexes her fingers. Legs: Muscle mass appears appropriate for age. There is no edema.  Neurologic: Muscle strength is normal for age and gender in both the upper and the lower extremities. Muscle tone appears normal. Sensation to touch is normal in the legs.  LAB DATA:   11/16/16: TSH 2.85, free T4 1.2, free T3 2.6  08/17/16: TSH 2.234, free T4 1.4, free T3 2.4  04/14/16: TSH 2.90, free T4 1.4, free T3 2.6  10/08/15: TSH 1.098, free T4 1.10, free T3 3.0; CBC normal, iron 97  08/20/15: TSH 1.513, free T4 1.34, free T3 2.8  04/09/15: TSH 2.563, free T4 1.29, free T3 2.6; WBC 4.3, Hgb 13.3, Hct 39.9%; iron 95  10/07/14: TSH 2.478, free T4 1.39, free T3 2.8  04/03/14: TSH 4.281, free T4 1.27, free T3  3.0  10/04/13: TSH 2.393, free T4 1.34, free T3 2.9  03/16/13: TSH 2.862, free T4 1.14, free T3 2.7  01/22/13: TSH 4.261, free T4 1.14, free T3 2.9  06/21/12: TSH 2.427, free T4 1.11, free T3 3.1  03/20/12: TSH 2.980, free T4 1.14, free T3 3.1  ASSESSMENT: 1. Hypothyroid: Patient is chemically euthyroid, but her TSH is higher than the goal range of 1.0-2.0. She is also clinically mildly hypothyroid. She appears to be slowly losing thyrocytes over time. She will benefit from a small increase in her Synthroid dosage.  2. Thyroiditis: Her thyroiditis is clinically quiescent today, but intermittently active. 3. Goiter: Her goiter is slightly larger today. The process of waxing and waning of thyroid gland size over time is c/w evolving Hashimoto's disease.  4. Pallor: Resolved. 5. Fatigue: This problem is somewhat worse despite her sleeping better.  6. Head tremor: I do not see any evidence of head tremor today. 7. Tremor: I see no evidence of this today.  8. 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 With the addition of topiramate XR the frequency and severity of her migraines decreased, but she still has the hormonal trigger effect of changes in her hormones when the Nuva ring is out.   9. Premature ovarian failure: This problem has become increasingly more common in the population, paralleling the increasing incidence of autoimmune thyroid disease and other autoimmune diseases in the population at large.   Plan:  1. Diagnostic: TFTs in 3 and 6 months. I put in the orders for the 21354-month and 5154-month tests.  2. Therapeutic: Continue the Synthroid dose of 137 mcg/day, but add 1/2 pill twice weekly, eg., Wednesdays and Sundays.   3. Patient education: We discussed the fact that she is gradually losing thyroid cells over time. Therefore we will need to check her TFTs every 3-6 months and increase the dose of Synthroid as needed.   4. Follow-up: FU in six months.   Level of Service: This visit lasted in excess of 45 minutes. More than 50% of the visit was devoted to counseling.  David StallBRENNAN,Eufemio Strahm J, MD, CDE Adult and Pediatric Endocrinology

## 2016-12-13 DIAGNOSIS — J209 Acute bronchitis, unspecified: Secondary | ICD-10-CM | POA: Diagnosis not present

## 2016-12-23 ENCOUNTER — Ambulatory Visit: Payer: BLUE CROSS/BLUE SHIELD | Admitting: Neurology

## 2017-01-03 ENCOUNTER — Ambulatory Visit (INDEPENDENT_AMBULATORY_CARE_PROVIDER_SITE_OTHER): Payer: BLUE CROSS/BLUE SHIELD | Admitting: Neurology

## 2017-01-03 VITALS — BP 115/73 | HR 71 | Ht 67.5 in | Wt 176.6 lb

## 2017-01-03 DIAGNOSIS — G43101 Migraine with aura, not intractable, with status migrainosus: Secondary | ICD-10-CM | POA: Diagnosis not present

## 2017-01-03 NOTE — Progress Notes (Signed)
[facility]    Provider:  Dr Nancy [name] Referring Provider: [name], MD Primary Care Physician:  Nancy Found, MD   CC: migraines  Interval history 01/03/2017: Her headaches are improved and she is doing wll on the Trokendi. She is on Nuvaring and she takes it out every 3 weeks and has a cycle then replaces it. Another option is to replace the Nuvaring every 3 weeks. She gets 3-4 migraines the week of her menses which is why replacing the Nuvaring every 3 weeks may help. No side effects to the Trokendi. These migraines are not as debilitating and the acute management helps. Husband is here and is very happy as well.   Interval history 06/22/2016:  Very nice 36 year old patient here for follow-up of migraines. Last seen almost a year ago, verapamil did not help her headaches, however the combination of Imitrex, and antiemetic and anti-inflammatory medication works great for acute management of migraines. She was recently diagnosed with ovarian failure. The migraines have changed since managing her ovarian failure, She gets migraines the week of her menses every day for a week and then a few other migraines the other weeks. Possibly 9-10 migraines a month. Verapamil didn't help. Husband had a vasectomy and she diagnosed with POI so no more kids. She tried Topamax in the past and it made her very tired and she would sleeps through hearing her children in the middle of the night. She is willing to try Topamax again. We discussed all the other options including other classes of medications however Topamax is a great migraine preventative and she is willing to try again we can try the extended release version which has improved side effects.Discussed other classes of medications and side effects, she decide to try Topamax.  Tried: Topamax. Did not tolerate Topamax IR. Failed verapamil.    HPI: 36 year old female with Migrains. PMHx hypothyroidism due to Hashimoto's  Thyroiditis, fatigue and tremor. She has had migraines since the age of 24. It was a cleaning product that caused it, resolved with changes in environmental factors and since then they come and go. They go in cycles, has them for a while then go away. Worsening the last 3 years. 3 migraines a week now. She tried Topamax and could not tolerate. Took fioricet in the past for acute management. Pain is on the right side of the head. Dizziness. Light headedness. Weakness in the arms after the headaches or during the headaches. Maybe numbness, like they are falling asleep. Left > right side affected. She had a head tremor for a few months that resolved. She had vertigo with her headaches a few weeks ago. She has had 3 occular migraines in her lifetime. The headaches last all day if she wakes up with it. If headache starts is in the afternoon, it may last 6 hours if she sleeps. Throbbing, light and sound sensitivity, nausea but rare vomiting.Dark room staying still helps. 7-8/10 at its worse. No auras. She feels mentally foggy with the migraines. No insomnia. No other focal neurologic deficits. Aunt with Parkinsons Dz.   Reviewed notes, labs and imaging from outside physicians, which showed: CMP 07/2015 unremarkable Creatinine 0.71. CBC nml, TSH nml, LDL 74,   Personally reviewed MRI brain:  Ventricle size normal. Cerebral volume normal. Low lying cerebellar tonsils approximately 5 mm without Chiari malformation. Pituitary normal in size. Negative for acute or chronic infarction Negative for demyelinating disease. Cerebral white matter is normal. Brainstem and basal ganglia are normal. Negative for  hemorrhage or mass lesion. Normal enhancement following contrast infusion. Paranasal sinuses are clear. IMPRESSION: Normal   Review of Systems: Patient complains of symptoms per HPI as well as the following symptoms: Fatigue, light sensitivity, cold sensitivity, nausea, food allergies, headache, weakness,  daytime sleepiness. Pertinent negatives per HPI. All others negative.   Social History   Social History  . Marital status: Married    Spouse name: Nancy [name]  . Number of children: 2  . Years of education: 16   Occupational History  . 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 Smoking status: Never Smoker  . Smokeless tobacco: Not on file  . Alcohol use No  . Drug use: No  . Sexual activity: Not on file   Other Topics Concern  . Not on file   Social History Narrative   Lives at home with husband Nancy [name] and 2 children.    Caffeinse use: 2 cups coffee per day    Family History  Problem Relation Age of Onset  . Diabetes Son   . Hypothyroidism Son   . Stroke Maternal Grandmother   . Heart disease Maternal Grandfather   . Migraines Neg Hx     Past Medical History:  Diagnosis Date  . Eczema   . Fatigue   . Hypothyroidism, acquired, autoimmune   . Migraines   . Thyroiditis, autoimmune    Hashimotos     Past Surgical History:  Procedure Laterality Date  . MOLE REMOVAL  2010  . tongue growth  2010  . WISDOM TOOTH EXTRACTION  1999    Current Outpatient Prescriptions  Medication Sig Dispense Refill  . cetirizine (ZYRTEC) 10 MG tablet Take 10 mg by mouth daily as needed.     . etonogestrel-ethinyl estradiol (NUVARING) 0.12-0.015 MG/24HR vaginal ring Place 1 each vaginally every 28 (twenty-eight) days. Insert vaginally and leave in place for 3 consecutive weeks, then remove for 1 week.    . levothyroxine (SYNTHROID, LEVOTHROID) 137 MCG tablet Take one 137 mcg Synthroid pill daily. Add 1/2 pill twice weekly. 35 tablet 6  . Melatonin 5 MG TABS Take 10 mg by mouth.    . naproxen (NAPROSYN) 500 MG tablet Take 500 mg by mouth 2 (two) times daily with a meal.    . ondansetron (ZOFRAN-ODT) 4 MG disintegrating tablet Take 1 tablet (4 mg total) by mouth every 8 (eight) hours as needed for nausea. (Patient not taking: Reported on 11/22/2016) 30 tablet 11  .  SUMAtriptan (IMITREX) 100 MG tablet Take 1 tablet (100 mg total) by mouth once. May repeat in 2 hours if headache persists or recurs. (Patient not taking: Reported on 11/22/2016) 10 tablet 11  . Topiramate ER 100 MG CP24 Take 100 mg by mouth at bedtime. 30 capsule 11   No current facility-administered medications for this visit.     Allergies as of 01/03/2017 - Review Complete 11/22/2016  Allergen Reaction Noted  . Codeine  04/07/2011    Vitals: There were no vitals taken for this visit. Last Weight:  Wt Readings from Last 1 Encounters:  11/22/16 176 lb 6.4 oz (80 kg)   Last Height:   Ht Readings from Last 1 Encounters:  06/22/16 5' 7.5" (1.715 m)     Speech:  Speech is normal; fluent and spontaneous with normal comprehension.  Cognition:  The patient is oriented to person, place, and time;   recent and remote memory intact;   language fluent;   normal attention, concentration,   fund  of knowledge Cranial Nerves:  The pupils are equal, round, and reactive to light. The fundi are normal and spontaneous venous pulsations are present. Visual fields are full to finger confrontation. Extraocular movements are intact. Trigeminal sensation is intact and the muscles of mastication are normal. The face is symmetric. The palate elevates in the midline. Hearing intact. Voice is normal. Shoulder shrug is normal. The tongue has normal motion without fasciculations.   Coordination:  Normal finger to nose and heel to shin. Normal rapid alternating movements.   Gait:  Heel-toe and tandem gait are normal.   Motor Observation:  No asymmetry, no atrophy, and no involuntary movements noted. Tone:  Normal muscle tone.   Posture:  Posture is normal. normal erect   Strength:  Strength is V/V in the upper and lower limbs.    Sensation: intact to LT   Reflex Exam:  DTR's:  Deep tendon reflexes in the upper and lower extremities are  normal bilaterally.  Toes:  The toes are downgoing bilaterally.  Clonus:  Clonus is absent.    Assessment/Plan: Patient with chronic migraines with aura, not intractable, without status. She has some basilar symptoms, she has had retinal migraines. . Discussed side effects which can include Dizziness, slow heartbeat, constipation, stomach upset, nausea, headache, and tiredness. If any of these effects persist or worsen or you experience anything else please stop medication and call us. Also gave him UpToDate patient handout on the medication.   - Remember to drink plenty of fluid, eat healthy meals and do not skip any meals. Try to eat protein with a every meal and eat a healthy snack such as fruit or nuts in between meals. Try to keep a regular sleep-wake schedule and try to exercise daily, particularly in the form of walking, 20-30 minutes a day, if you can.   As far as your medications are concerned, I would like to suggest: Continue Trokendi, she is doing very well  - For her menstrual migraines, an option is to replace the Nuvaring every 3 weeks and not allow a week without Nuvaring since migraines are during week of period.   As far as diagnostic testing: None  I would like to see you back in 6 months, sooner if we need to. Please call us with any interim questions, concerns, problems, updates or refill requests.   Please also call us for any test results so we can go over those with you on the phone.  Discussed to prevent or relieve headaches, try the following:  Cool Compress. Lie down and place a cool compress on your head.   Avoid headache triggers. If certain foods or odors seem to have triggered your migraines in the past, avoid them. A headache diary might help you identify triggers.   Include physical activity in your daily routine. Try a daily walk or other moderate aerobic exercise.   Manage stress. Find healthy ways to cope with the stressors, such as  delegating tasks on your to-do list.   Practice relaxation techniques. Try deep breathing, yoga, massage and visualization.   Eat regularly. Eating regularly scheduled meals and maintaining a healthy diet might help prevent headaches. Also, drink plenty of fluids.   Follow a regular sleep schedule. Sleep deprivation might contribute to headaches  Consider biofeedback. With this mind-body technique, you learn to control certain bodily functions - such as muscle tension, heart rate and blood pressure - to prevent headaches or reduce headache pain.    Proceed to emergency room if  you experience new or worsening symptoms or symptoms do not resolve, if you have new neurologic symptoms or if headache is severe, or for any concerning symptom.    Will continue Imitrex with alleve and zofran for acute management. Did discuss at length that this medication is contraindicated in patients with hemiplegic or basilar type migraine due to risk of stroke or other vascular complications. However this is controversial and some studies do not should increased risk. Offered other acute management options. Patient acknowledges risks. If she has any side effects, would like her to stop and call us. Provided UpToDate patient drug information handout. Discussed teratogenicity.   Naomie DeanAntonia Ahern, MD  [facility] [address] [place], [place] [id number]  Phone [phone] Fax [phone]  A total of 30 minutes was spent face-to-face with this patient. Over half this time was spent on counseling patient on the migraine diagnosis and different diagnostic and therapeutic options available.

## 2017-01-03 NOTE — Patient Instructions (Addendum)
Remember to drink plenty of fluid, eat healthy meals and do not skip any meals. Try to eat protein with a every meal and eat a healthy snack such as fruit or nuts in between meals. Try to keep a regular sleep-wake schedule and try to exercise daily, particularly in the form of walking, 20-30 minutes a day, if you can.   As far as your medications are concerned, I would like to suggest: Continue Trokendi. Consider replacing the Nuvaring every 3 weeks, [facility]   I would like to see you back in 1 year, sooner if we need to. Please call us with any interim questions, concerns, problems, updates or refill requests.   Our phone number is [phone]. We also have an after hours call service for urgent matters and there is a physician on-call for urgent questions. For any emergencies you know to call 911 or go to the nearest emergency room

## 2017-02-16 DIAGNOSIS — E038 Other specified hypothyroidism: Secondary | ICD-10-CM | POA: Diagnosis not present

## 2017-02-16 LAB — T4, FREE: FREE T4: 1.4 ng/dL (ref 0.8–1.8)

## 2017-02-16 LAB — T3, FREE: T3, Free: 2.9 pg/mL (ref 2.3–4.2)

## 2017-02-16 LAB — TSH: TSH: 2.62 m[IU]/L

## 2017-02-23 ENCOUNTER — Encounter (INDEPENDENT_AMBULATORY_CARE_PROVIDER_SITE_OTHER): Payer: Self-pay | Admitting: "Endocrinology

## 2017-02-23 ENCOUNTER — Encounter: Payer: Self-pay | Admitting: Neurology

## 2017-02-25 ENCOUNTER — Encounter (INDEPENDENT_AMBULATORY_CARE_PROVIDER_SITE_OTHER): Payer: Self-pay | Admitting: *Deleted

## 2017-02-25 ENCOUNTER — Telehealth (INDEPENDENT_AMBULATORY_CARE_PROVIDER_SITE_OTHER): Payer: Self-pay

## 2017-02-25 NOTE — Telephone Encounter (Signed)
Called and left voicemail, advised her of the lab result and to take the added 1/2 pill from 2 days a week to 5 days a week per Dr. Fransico MichaelBrennan.

## 2017-02-28 ENCOUNTER — Other Ambulatory Visit: Payer: Self-pay | Admitting: Neurology

## 2017-02-28 DIAGNOSIS — G43009 Migraine without aura, not intractable, without status migrainosus: Secondary | ICD-10-CM

## 2017-03-16 ENCOUNTER — Encounter: Payer: Self-pay | Admitting: Neurology

## 2017-05-24 ENCOUNTER — Ambulatory Visit (INDEPENDENT_AMBULATORY_CARE_PROVIDER_SITE_OTHER): Payer: BLUE CROSS/BLUE SHIELD | Admitting: "Endocrinology

## 2017-06-02 DIAGNOSIS — E038 Other specified hypothyroidism: Secondary | ICD-10-CM | POA: Diagnosis not present

## 2017-06-02 LAB — T4, FREE: Free T4: 1.5 ng/dL (ref 0.8–1.8)

## 2017-06-02 LAB — TSH: TSH: 1.49 mIU/L

## 2017-06-02 LAB — T3, FREE: T3 FREE: 2.6 pg/mL (ref 2.3–4.2)

## 2017-06-07 ENCOUNTER — Ambulatory Visit (INDEPENDENT_AMBULATORY_CARE_PROVIDER_SITE_OTHER): Payer: BLUE CROSS/BLUE SHIELD | Admitting: "Endocrinology

## 2017-06-07 ENCOUNTER — Encounter (INDEPENDENT_AMBULATORY_CARE_PROVIDER_SITE_OTHER): Payer: Self-pay | Admitting: "Endocrinology

## 2017-06-07 VITALS — BP 118/76 | HR 86 | Wt 185.2 lb

## 2017-06-07 DIAGNOSIS — E288 Other ovarian dysfunction: Secondary | ICD-10-CM

## 2017-06-07 DIAGNOSIS — E063 Autoimmune thyroiditis: Secondary | ICD-10-CM | POA: Diagnosis not present

## 2017-06-07 DIAGNOSIS — E049 Nontoxic goiter, unspecified: Secondary | ICD-10-CM | POA: Diagnosis not present

## 2017-06-07 DIAGNOSIS — R5383 Other fatigue: Secondary | ICD-10-CM

## 2017-06-07 DIAGNOSIS — R252 Cramp and spasm: Secondary | ICD-10-CM | POA: Diagnosis not present

## 2017-06-07 DIAGNOSIS — R251 Tremor, unspecified: Secondary | ICD-10-CM

## 2017-06-07 DIAGNOSIS — E2839 Other primary ovarian failure: Secondary | ICD-10-CM

## 2017-06-07 NOTE — Patient Instructions (Signed)
Follow up visit in 6 months. Please repeat thyroid tests in 3 and 6 months  

## 2017-06-07 NOTE — Progress Notes (Signed)
CC: FU of hypothyroidism, secondary to Hashimoto's thyroiditis, goiter, pallor, and fatigue.  HPI: Nancy [name] is a 36 y.o. Caucasian woman. She was unaccompanied by her husband.  1. Nancy [name] was diagnosed with hypothyroidism due to Hashimoto's Thyroiditis about September 2009, 6-7 months after the birth of her second child. She was started on levothyroxine. On 02/03/11 I saw her as a new patient. She felt better on Synthroid, but was still cold. She had a 20+ gram gland. The remainder of her exam was normal.  2. During the past nine years we have gradually increased her dose of Synthroid to 137 mcg/day. Her symptoms of coldness and fatigue have improved, but not totally resolved. Her weight has fluctuated during this time.  3. Her last clinic visit was on 11/22/16. At that visit I increased her Synthroid by adding 1/2 pill two days each week. In March 2018 I increased her dose by adding 1/2 pill 5 days per week.   A. In the interim she has been healthy and has been less tired. Her energy is better, she is not as cold, and her emotions have been normal.       1). Her last regular menstrual period was on July 20th 2016. 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    2). She is having increased amounts of leg cramps and muscle spasms.    3). She has not had as many migraines now since she has been replacing the Nuva ring every 3 weeks. She is being followed by Nancy [name] at [facility].    4). She is sleeping well. She has remained off trazodone. She still gets up to check her son, Nancy [name], for his T1DM every other night. She has 1-2 caffeinated drink per day.   B. She remains on Synthroid, 137 mcg/day on two days each week, but 1.5 of the 137 mcg pills/day on 5 days each week.   C. She does zumba one day each week, yoga on Saturday mornings, and workout videos several days per week.   D. Her mother finished chemotherapy and XRT for lung cancer  metastatic to the brain about two years ago. Her cancer remains in remission.   4. Pertinent Review of Systems: Constitutional. The patient feels "pretty good". 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 Energy: Energy level is "usually good".    Body temperature: The patient's body temperature is higher, but she is still "cold natured".  Weight: She has gained 9 pounds.   Eyes: The patient's vision is good. There are no problems with soreness, bulging, or limited range of eye movements. Neck: The patient is not aware of any problems relating to the anterior neck and thyroid bed. There have been no significant problems with swelling, pain, soreness, tenderness, pressure, discomfort, or difficulty swallowing. Heart: The patient feels the expected increase in heart rate during exercise or other physical activities. There have been no significant problems with palpitations, irregular heart beats, chest pain, or chest pressure. Gastrointestinal: Stomach and intestines seem to be working normally. Bowel movements are normal. There are no significant complaints of excessive hunger, acid reflux, upset stomach, stomach aches or pains, diarrhea, or constipation. Musculoskeletal: She has been having calf cramps in the middle of the night. The calf cramps occur if she stretches her legs or ankles too much as when she does yoga. If she overstretches her ankles  she can also develop spasms of the dorsa of her feet and in the toes. Muscles and extremities appear to be working normally. There are no significant problems with hand tremor, sweaty palms, palmar erythema, or lower leg swelling. Psychological: She is doing well.   Mental: Her brain fog resolved once she began sleeping normally. She has no problems with her abilities to pay attention, to remember, to think, and to make decisions.      GYN: As above.   PAST MEDICAL, FAMILY, AND SOCIAL  HISTORY: 1. Family and Work: She is still at home taking care of her children. 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 [place]. Her sister was diagnosed with hypothyroidism secondary to Hashimoto's thyroiditis. She has a grand aunt who had Parkinson's Dz.  2. Activities: She had been exercising more.   3. Tobacco, alcohol, and drugs: None 4. PCP: Dr. Merri Brunetteandace Smith  REVIEW OF SYSTEMS: Ms. Nancy SpiroVan de [name] has no other significant issues involving her other body systems.  PHYSICAL EXAM: BP 118/76   Pulse 86   Wt 185 lb 3.2 oz (84 kg)   BMI 28.58 kg/m  She has gained 9 pounds.   Constitutional: The patient looks healthy and quite normal. She is bright and alert. Her affect and insight are normal.  Head: I do not detect any head tremor today.    Eyes: There is no arcus or proptosis.  Mouth: The oropharynx appears normal. The tongue appears normal. There is normal oral moisture. There is no obvious gingivitis. Neck: There are no bruits present. The thyroid gland appears normal on inspection. The thyroid gland is again enlarged at about 21 grams. The left lobe is mildly enlarged. The right lobe is normal in size. The consistency of the thyroid gland is normal. There is no thyroid tenderness to palpation. Lungs: The lungs are clear. Air movement is good. Heart: The heart rhythm and rate appear normal. Heart sounds S1 and S2 are normal. I do not appreciate any pathologic heart murmurs. Abdomen: The abdomen is mildly enlarged. Bowel sounds are normal. The abdomen is soft and non-tender. There is no obviously palpable hepatomegaly, splenomegaly, or other masses.  Arms: Muscle mass appears appropriate for age.  Hands: She has no tremor today. Phalangeal and metacarpophalangeal joints appear normal. Palms are warm. She has no pallor of the fingers when she flexes her fingers. Legs: Muscle mass appears appropriate for age. There is no edema.  Neurologic:  Muscle strength is normal for age and gender in both the upper and the lower extremities. Muscle tone appears normal. Sensation to touch is normal in the legs.  LAB DATA:   Labs 06/02/17: TSH 1.49, free T4 1.5, free T3 2.6  Labs 02/16/17: TSH 2.62, free T4 1.4, free T3 2.9  11/16/16: TSH 2.85, free T4 1.2, free T3 2.6  08/17/16: TSH 2.234, free T4 1.4, free T3 2.4  04/14/16: TSH 2.90, free T4 1.4, free T3 2.6  10/08/15: TSH 1.098, free T4 1.10, free T3 3.0; CBC normal, iron 97  08/20/15: TSH 1.513, free T4 1.34, free T3 2.8  04/09/15: TSH 2.563, free T4 1.29, free T3 2.6; WBC 4.3, Hgb 13.3, Hct 39.9%; iron 95  10/07/14: TSH 2.478, free T4 1.39, free T3 2.8  04/03/14: TSH 4.281, free T4 1.27, free T3 3.0  10/04/13: TSH 2.393, free T4 1.34, free T3 2.9  03/16/13: TSH 2.862, free T4 1.14, free T3 2.7  01/22/13: TSH 4.261,  free T4 1.14, free T3 2.9  06/21/12: TSH 2.427, free T4 1.11, free T3 3.1  03/20/12: TSH 2.980, free T4 1.14, free T3 3.1  ASSESSMENT: 1. Hypothyroid:   A. Patient is clinically euthyroid and she is chemically mid-range euthyroid. This is the best that she has been symptomatically for long time. It is clear that Cozette is one of those people who really heed to keep her TSH between 1.0-2.0, which is the ideal goal range for TSH for patients who are on Synthroid replacement.   B. It is also clear that Kemyra is continuing to lose thyrocytes over time, but not necessarily in a steadily progressive pattern.   C. Therefore, we will continue to check her TFTs every 3 months and see her every 6 months in follow up.  2. Thyroiditis: Her thyroiditis is clinically quiescent today, but intermittently active. 3. Goiter: Her goiter is again enlarged about the same amount today. The process of waxing and waning of thyroid gland size over time is c/w evolving Hashimoto's disease.  4. Pallor: Resolved. 5. Fatigue: This problem has improved greatly after her TSH decreased to 1.5.   6.  Head tremor: I do not see any evidence of head tremor today. 7. Tremor: I see no evidence of this today.   8. 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 With the addition of topiramate XR the frequency and severity of her migraines decreased. With the replacement of her Nuva ring every three weeks the frequency of the migraines decreased even more.  9. Premature ovarian failure: This problem has become increasingly more common in the population, paralleling the increasing incidence of autoimmune thyroid disease and other autoimmune diseases in the population at large.  10. Muscle cramps and spasms: The cramps and spasms have been occurring more frequently for the past 2-3 months, but she can't remember any specific triggering events. Given the autoimmune nature of her hypothyroidism and POF, it is quite possible that she may be developing autoimmune hypoparathyroidism. We need to check her 25-OH vitamin D, calcitriol, potassium, calcium, PTH, Mg, and phosphorus.  Plan:  1. Diagnostic: Draw the above labs today. TFTs in 3 and 6 months. I put in the orders for the 72-month and 55-month tests.  2. Therapeutic: Continue the Synthroid dose of 137 mcg/day, but add 1/2 pill five days per week.  3. Patient education: We discussed the fact that she is gradually losing thyroid cells over time. Therefore we will need to check her TFTs every 3-6 months and increase the dose of Synthroid as needed. We also discussed the need to draw labs today in an effort to identify and electrolyte of hormonal cause of her calf and foot cramps.  4. Follow-up: FU in six months.   Level of Service: This visit lasted in excess of 60 minutes. More than 50% of the visit was devoted to counseling.  Molli Knock, MD, CDE Adult and Pediatric Endocrinology

## 2017-06-08 LAB — COMPREHENSIVE METABOLIC PANEL
ALT: 13 U/L (ref 6–29)
AST: 13 U/L (ref 10–30)
Albumin: 4.3 g/dL (ref 3.6–5.1)
Alkaline Phosphatase: 33 U/L (ref 33–115)
BUN: 16 mg/dL (ref 7–25)
CALCIUM: 9.3 mg/dL (ref 8.6–10.2)
CO2: 22 mmol/L (ref 20–31)
Chloride: 107 mmol/L (ref 98–110)
Creat: 0.85 mg/dL (ref 0.50–1.10)
GLUCOSE: 103 mg/dL — AB (ref 70–99)
POTASSIUM: 3.8 mmol/L (ref 3.5–5.3)
Sodium: 141 mmol/L (ref 135–146)
Total Bilirubin: 0.3 mg/dL (ref 0.2–1.2)
Total Protein: 7.3 g/dL (ref 6.1–8.1)

## 2017-06-08 LAB — VITAMIN D 25 HYDROXY (VIT D DEFICIENCY, FRACTURES): VIT D 25 HYDROXY: 29 ng/mL — AB (ref 30–100)

## 2017-06-08 LAB — PTH, INTACT AND CALCIUM
Calcium: 9.3 mg/dL (ref 8.6–10.2)
PTH: 25 pg/mL (ref 14–64)

## 2017-06-08 LAB — MAGNESIUM: Magnesium: 2.1 mg/dL (ref 1.5–2.5)

## 2017-06-08 LAB — PHOSPHORUS: PHOSPHORUS: 3 mg/dL (ref 2.5–4.5)

## 2017-06-09 LAB — VITAMIN D 1,25 DIHYDROXY
VITAMIN D 1, 25 (OH) TOTAL: 70 pg/mL (ref 18–72)
VITAMIN D3 1, 25 (OH): 70 pg/mL

## 2017-06-18 ENCOUNTER — Other Ambulatory Visit (INDEPENDENT_AMBULATORY_CARE_PROVIDER_SITE_OTHER): Payer: Self-pay | Admitting: "Endocrinology

## 2017-06-18 DIAGNOSIS — E063 Autoimmune thyroiditis: Secondary | ICD-10-CM

## 2017-07-14 ENCOUNTER — Telehealth: Payer: Self-pay | Admitting: Neurology

## 2017-07-14 DIAGNOSIS — G43009 Migraine without aura, not intractable, without status migrainosus: Secondary | ICD-10-CM

## 2017-07-14 MED ORDER — TOPIRAMATE ER 100 MG PO CAP24: 100.0000 mg | ORAL_CAPSULE | Freq: Every day | ORAL | 11 refills | Status: DC

## 2017-07-14 NOTE — Telephone Encounter (Signed)
Tiffany request refill forTopiramate ER 100 MG CP24 .

## 2017-07-14 NOTE — Telephone Encounter (Signed)
Refills e-scribed as requested. 

## 2017-07-22 ENCOUNTER — Other Ambulatory Visit: Payer: Self-pay | Admitting: Neurology

## 2017-07-22 ENCOUNTER — Telehealth (INDEPENDENT_AMBULATORY_CARE_PROVIDER_SITE_OTHER): Payer: Self-pay | Admitting: "Endocrinology

## 2017-07-22 ENCOUNTER — Other Ambulatory Visit (INDEPENDENT_AMBULATORY_CARE_PROVIDER_SITE_OTHER): Payer: Self-pay

## 2017-07-22 DIAGNOSIS — E063 Autoimmune thyroiditis: Secondary | ICD-10-CM

## 2017-07-22 MED ORDER — LEVOTHYROXINE SODIUM 137 MCG PO TABS: ORAL_TABLET | ORAL | 4 refills | Status: DC

## 2017-07-22 NOTE — Telephone Encounter (Signed)
rx sent

## 2017-07-22 NOTE — Telephone Encounter (Signed)
°  Who's calling (name and relationship to patient) : Self Best contact number: [phone] Provider they see: Fransico MichaelBrennan Reason for call:     PRESCRIPTION REFILL ONLY  Name of prescription: Synthroid  [facility] [address] [facility] [place]

## 2017-08-22 ENCOUNTER — Other Ambulatory Visit: Payer: Self-pay | Admitting: Neurology

## 2017-08-22 DIAGNOSIS — G43009 Migraine without aura, not intractable, without status migrainosus: Secondary | ICD-10-CM

## 2017-08-24 DIAGNOSIS — H40033 Anatomical narrow angle, bilateral: Secondary | ICD-10-CM | POA: Diagnosis not present

## 2017-08-24 DIAGNOSIS — G44219 Episodic tension-type headache, not intractable: Secondary | ICD-10-CM | POA: Diagnosis not present

## 2017-08-30 DIAGNOSIS — Z01419 Encounter for gynecological examination (general) (routine) without abnormal findings: Secondary | ICD-10-CM | POA: Diagnosis not present

## 2017-08-30 DIAGNOSIS — E063 Autoimmune thyroiditis: Secondary | ICD-10-CM | POA: Diagnosis not present

## 2017-08-30 DIAGNOSIS — Z23 Encounter for immunization: Secondary | ICD-10-CM | POA: Diagnosis not present

## 2017-08-30 LAB — TSH: TSH: 1.25 mIU/L

## 2017-08-30 LAB — T3, FREE: T3, Free: 2.7 pg/mL (ref 2.3–4.2)

## 2017-08-30 LAB — T4, FREE: FREE T4: 1.5 ng/dL (ref 0.8–1.8)

## 2017-09-05 ENCOUNTER — Encounter (INDEPENDENT_AMBULATORY_CARE_PROVIDER_SITE_OTHER): Payer: Self-pay | Admitting: *Deleted

## 2017-09-19 ENCOUNTER — Other Ambulatory Visit: Payer: Self-pay | Admitting: Neurology

## 2017-09-19 DIAGNOSIS — G43009 Migraine without aura, not intractable, without status migrainosus: Secondary | ICD-10-CM

## 2017-10-03 ENCOUNTER — Encounter: Payer: Self-pay | Admitting: Neurology

## 2017-10-04 ENCOUNTER — Other Ambulatory Visit: Payer: Self-pay | Admitting: Neurology

## 2017-10-04 MED ORDER — TOPIRAMATE ER 50 MG PO CAP24: 150.0000 mg | ORAL_CAPSULE | Freq: Every day | ORAL | 6 refills | Status: DC

## 2017-10-06 ENCOUNTER — Other Ambulatory Visit: Payer: Self-pay | Admitting: Neurology

## 2017-10-06 MED ORDER — TOPIRAMATE ER 50 MG PO CAP24: 150.0000 mg | ORAL_CAPSULE | Freq: Every day | ORAL | 6 refills | Status: DC

## 2017-10-19 ENCOUNTER — Telehealth: Payer: Self-pay | Admitting: *Deleted

## 2017-10-19 ENCOUNTER — Other Ambulatory Visit: Payer: Self-pay | Admitting: Neurology

## 2017-10-19 DIAGNOSIS — G43009 Migraine without aura, not intractable, without status migrainosus: Secondary | ICD-10-CM

## 2017-10-19 NOTE — Telephone Encounter (Signed)
Received refill request for Zofran. Patient last seen in January of this year. I called and spoke with the patient. She denies any new issues, just states that she needs the Zofran when she has a migraine. She went to the pharmacy to refill other meds as well and needed a refill ordered for the Zofran. 30 tablets with no refill sent to pharmacy.

## 2017-10-20 ENCOUNTER — Encounter: Payer: Self-pay | Admitting: Neurology

## 2017-10-20 ENCOUNTER — Other Ambulatory Visit: Payer: Self-pay | Admitting: Neurology

## 2017-10-20 DIAGNOSIS — G43009 Migraine without aura, not intractable, without status migrainosus: Secondary | ICD-10-CM

## 2017-10-20 MED ORDER — ONDANSETRON 4 MG PO TBDP: ORAL_TABLET | ORAL | 11 refills | Status: DC

## 2017-11-15 ENCOUNTER — Other Ambulatory Visit: Payer: Self-pay | Admitting: Neurology

## 2017-11-15 DIAGNOSIS — G43009 Migraine without aura, not intractable, without status migrainosus: Secondary | ICD-10-CM

## 2017-11-28 ENCOUNTER — Other Ambulatory Visit (INDEPENDENT_AMBULATORY_CARE_PROVIDER_SITE_OTHER): Payer: Self-pay | Admitting: "Endocrinology

## 2017-11-28 DIAGNOSIS — E063 Autoimmune thyroiditis: Secondary | ICD-10-CM

## 2017-12-08 DIAGNOSIS — E063 Autoimmune thyroiditis: Secondary | ICD-10-CM | POA: Diagnosis not present

## 2017-12-09 LAB — TSH: TSH: 1.53 m[IU]/L

## 2017-12-09 LAB — T4, FREE: Free T4: 1.4 ng/dL (ref 0.8–1.8)

## 2017-12-09 LAB — T3, FREE: T3, Free: 2.5 pg/mL (ref 2.3–4.2)

## 2017-12-15 ENCOUNTER — Ambulatory Visit (INDEPENDENT_AMBULATORY_CARE_PROVIDER_SITE_OTHER): Payer: BLUE CROSS/BLUE SHIELD | Admitting: "Endocrinology

## 2017-12-15 ENCOUNTER — Encounter (INDEPENDENT_AMBULATORY_CARE_PROVIDER_SITE_OTHER): Payer: Self-pay | Admitting: "Endocrinology

## 2017-12-15 VITALS — BP 124/78 | HR 68 | Ht 67.6 in | Wt 190.6 lb

## 2017-12-15 DIAGNOSIS — R251 Tremor, unspecified: Secondary | ICD-10-CM

## 2017-12-15 DIAGNOSIS — E063 Autoimmune thyroiditis: Secondary | ICD-10-CM

## 2017-12-15 DIAGNOSIS — E559 Vitamin D deficiency, unspecified: Secondary | ICD-10-CM

## 2017-12-15 DIAGNOSIS — R5383 Other fatigue: Secondary | ICD-10-CM

## 2017-12-15 DIAGNOSIS — E2839 Other primary ovarian failure: Secondary | ICD-10-CM

## 2017-12-15 DIAGNOSIS — E049 Nontoxic goiter, unspecified: Secondary | ICD-10-CM

## 2017-12-15 DIAGNOSIS — E288 Other ovarian dysfunction: Secondary | ICD-10-CM | POA: Diagnosis not present

## 2017-12-15 NOTE — Patient Instructions (Signed)
Follow up visit in 6 months. Please repeat thyroid tests in 3 months. Please repeat thyroid tests, PTH, calcium, and vitamin D in 6 months. Pleasesatart one Citracal tablet per day that contains 600 mg of calcium and 400 IU of vitamin D.

## 2017-12-15 NOTE — Progress Notes (Signed)
CC: FU of hypothyroidism, secondary to Hashimoto's thyroiditis, goiter, pallor, and fatigue.  HPI: Nancy [name] is a 37 y.o. Caucasian woman. She was unaccompanied.  1. [name] was diagnosed with hypothyroidism due to Hashimoto's Thyroiditis about September 2009, 6-7 months after the birth of her second child. She was started on levothyroxine. On 02/03/11 I saw her as a new patient. She felt better on Synthroid, but was still cold. She had a 20+ gram gland. The remainder of her exam was normal.  2. During the past nine years we have gradually increased her dose of Synthroid over time in order to attain a TSH goal of 1.0-2.0. Her symptoms of coldness and fatigue have improved, but not totally resolved. Her weight has fluctuated during this time.  3. Her last clinic visit was on 06/07/17. At that visit I continued her Synthroid dose of one 137 mcg pill /day on two days each week, but 1.5 of the 137 mcg pills/day on 5 days each week.   A. In the interim she has been healthy, except for a recent URI that ran through her family.   1). Her mother passed away three weeks ago after three months of illness, so the holidays were "rough" for [place]. Ironically, her mother finished chemotherapy and XRT for lung cancer metastatic to the brain about two years ago. Her cancer remained in remission prior to her death.    2). Aniston's migraine medications have stopped working. She is now having migraines about every other day. She will follow up with Dr. Lucia Gaskins at [facility] soon.        3). Her last regular menstrual period was on July 20th 2016. 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    4). She is having fewer leg cramps and muscle spasms, but they still occur several times per week.    5). She is not sleeping well. She does not have insomnia, but she awakens 1-2 times each night. She has remained off trazodone. She still gets up to check her son, Nancy [name],  for his T1DM every other night. She has 1 caffeinated drink per day.   B. She remains on Synthroid, 137 mcg/day on two days each week, but 1.5 of the 137 mcg pills/day on 5 days each week.   C. She has not been doing zumba or other exercise for several months while her mom was sick. She has recently resumed doing exercise.    4. Pertinent Review of Systems: Constitutional. The patient feels "okay, but sad". 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 Energy: Energy level is "low".    Body temperature: The patient's body temperature is higher, but she is still "cold natured".  Weight: She has gained 5 pounds.   Eyes: The patient's vision is good. There are no problems with soreness, bulging, or limited range of eye movements. Neck: The patient is not aware of any problems relating to the anterior neck and thyroid bed. There have been no significant problems with swelling, pain, soreness, tenderness, pressure, discomfort, or difficulty swallowing. Heart: The patient feels the expected increase in heart rate during exercise or other physical activities. There have been no significant problems with palpitations, irregular heart beats, chest pain, or chest pressure. Gastrointestinal: She thinks that she is developing dairy intolerance. Bowel movements are normal. There are no significant complaints of excessive hunger, acid reflux, upset stomach,  stomach aches or pains, diarrhea, or constipation. Musculoskeletal: She has still been having calf cramps in the middle of the night. The calf cramps occur if she stretches her legs or ankles too much as when she does yoga. If she overstretches her ankles she can also develop spasms of the dorsa of her feet and in the toes. Muscles and extremities appear to be working normally. There are no significant problems with hand tremor, sweaty palms, palmar erythema, or lower leg swelling. Psychological: As  above.    Mental: Her brain fog varies with the amount of good sleep that she gets. She has no problems with her abilities to pay attention, to remember, to think, and to make decisions.      GYN: As above.   PAST MEDICAL, FAMILY, AND SOCIAL HISTORY: 1. Family and Work: She is still at home taking care of her children. The family still lives in [place], [place]. Her sister was diagnosed with hypothyroidism secondary to Hashimoto's thyroiditis. She has a grand aunt who had Parkinson's Dz.  2. Activities: She had been exercising more prior to her mother's illness, then stopped exercising, but has recently re-started her exercise program. .   3. Tobacco, alcohol, and drugs: None 4. PCP: Dr. [name]  REVIEW OF SYSTEMS: Ms. nelwyn hebdon has no other significant issues involving her other body systems.  PHYSICAL EXAM: BP 124/78   Pulse 68   Ht 5' 7.6" (1.717 m)   Wt 190 lb 9.6 oz (86.5 kg)   BMI 29.33 kg/m  She has gained 5 pounds.   Constitutional: The patient looks healthy and quite normal. She is bright and alert. Her affect and insight are normal, except that she became sad when she discussed her mother's illness and death.   Head: I do not detect any head tremor today.    Eyes: There is no arcus or proptosis.  Mouth: The oropharynx appears normal. The tongue appears normal. There is normal oral moisture. There is no obvious gingivitis. Neck: There are no bruits present. The thyroid gland appears normal on inspection. The thyroid gland is again enlarged, but a bit smaller, at about 20-21 grams. The left lobe is only very mildly enlarged. The right lobe is normal in size. The consistency of the thyroid gland is normal. There is no thyroid tenderness to palpation. Lungs: The lungs are clear. Air movement is good. Heart: The heart rhythm and rate appear normal. Heart sounds S1 and S2 are normal. I do not appreciate any pathologic heart murmurs. Abdomen: The abdomen is mildly enlarged. Bowel  sounds are normal. The abdomen is soft and non-tender. There is no obviously palpable hepatomegaly, splenomegaly, or other masses.  Arms: Muscle mass appears appropriate for age.  Hands: She has only a trace tremor today. Phalangeal and metacarpophalangeal joints appear normal. Palms are warm. She has no pallor of the fingers when she flexes her fingers.The nails remain dystrophic. Legs: Muscle mass appears appropriate for age. There is no edema.  Neurologic: Muscle strength is normal for age and gender in both the upper and the lower extremities. Muscle tone appears normal. Sensation to touch is normal in the legs.  LAB DATA:   Labs 12/08/17: TSH 1.53, free T4 1.4, free T3 2.5  Labs 06/02/17: TSH 1.49, free T4 1.5, free T3 2.6; 1,25-OH vitamin D 70 (ref 18-72), 25-OH vitamin D 29, magnesium 2.1 (ref 1.5-2.5), phosphorus 3.0 (ref 2.5-4.5), PTH 25 (ref 14-64), calcium 9.3 (8.6-10.2); CMP normal   Labs 02/16/17: TSH  2.62, free T4 1.4, free T3 2.9  11/16/16: TSH 2.85, free T4 1.2, free T3 2.6  08/17/16: TSH 2.234, free T4 1.4, free T3 2.4  04/14/16: TSH 2.90, free T4 1.4, free T3 2.6  10/08/15: TSH 1.098, free T4 1.10, free T3 3.0; CBC normal, iron 97  08/20/15: TSH 1.513, free T4 1.34, free T3 2.8  04/09/15: TSH 2.563, free T4 1.29, free T3 2.6; WBC 4.3, Hgb 13.3, Hct 39.9%; iron 95  10/07/14: TSH 2.478, free T4 1.39, free T3 2.8  04/03/14: TSH 4.281, free T4 1.27, free T3 3.0  10/04/13: TSH 2.393, free T4 1.34, free T3 2.9  03/16/13: TSH 2.862, free T4 1.14, free T3 2.7  01/22/13: TSH 4.261, free T4 1.14, free T3 2.9  06/21/12: TSH 2.427, free T4 1.11, free T3 3.1  03/20/12: TSH 2.980, free T4 1.14, free T3 3.1  ASSESSMENT: 1. Hypothyroid:   A. Patient is again clinically euthyroid and she is again chemically mid-range euthyroid. This is the best that she has been symptomatically for a long time. It is clear that Shaquanta is one of those people who really heed to keep her TSH between  1.0-2.0, which is the ideal goal range for TSH for patients who are on Synthroid replacement.   B. It is also clear that Graceann is continuing to lose thyrocytes over time, but not necessarily in a steadily progressive pattern.   C. Therefore, we will continue to check her TFTs every 3 months and see her every 6 months in follow up. Will also draw vitamin D, calcium, and PTH in 6 months.  2. Thyroiditis: Her thyroiditis is clinically quiescent today, but intermittently active. 3. Goiter: Her goiter is again enlarged today, but is a bit smaller. The process of waxing and waning of thyroid gland size over time is c/w evolving Hashimoto's disease.  4. Pallor: Resolved. 5. Fatigue: This problem improved greatly after her TSH decreased to 1.5, but is still dependent upon her obtaining enough good quality sleep.   6. Head tremor: I do not see any evidence of head tremor today. 7. Tremor: I see only a trace of tremor today.    8. Migraines with arm and hand weakness:   A. Her previous symptoms appear to have been atypical migraine equivalents. With the addition of topiramate XR the frequency and severity of her migraines decreased. With the replacement of her Nuva ring every three weeks the frequency of the migraines decreased even more.   B. Unfortunately, the migraines have become much more frequent recently. She may need to have one or more of her medications changed.  9. Premature ovarian failure:   A. This problem has become increasingly more common in the population, paralleling the increasing incidence of autoimmune thyroid disease and other autoimmune diseases in the population at large.  10. Muscle cramps and spasms: The cramps and spasms have been occurring less frequently, but still bother her.   B. Given the autoimmune nature of her hypothyroidism and POF, it was quite possible that she might be developing autoimmune hypoparathyroidism. Fortunately, her PTH, calcium, magnesium, phosphorus, and  calcitriol levels were normal. Her vitamin D level was a bit low.  11. Vitamin D deficiency: Because she has been reducing her dairy intake, and because her intestine absorbs vitamin D less well over time, she needs exogenous vitamin D. I suggested that she take one Citracal tablet daily with 500-600 mg of calcium and 400 IU of vitamin D per day.  Plan:  1. Diagnostic:  TFTs in 3 and 6 months. I put in the orders for the 2380-month and 4342-month tests.  2. Therapeutic: Continue the Synthroid dose of 137 mcg/da for two days each week and 1.5 pills/day on five days per week. Add one Citracal, 600/400 tablet daily.  3. Patient education: We discussed the fact that she is gradually losing thyroid cells over time. Therefore we will need to check her TFTs every 3-6 months and increase the dose of Synthroid as needed. We also discussed the need to draw labs to assess whether or not she seems to be developing autoimmune hypoparathyroidism. 4. Follow-up: FU in six months.   Level of Service: This visit lasted in excess of 60 minutes. More than 50% of the visit was devoted to counseling.  Molli KnockMichael Dhrithi Riche, MD, CDE Adult and Pediatric Endocrinology

## 2017-12-26 ENCOUNTER — Telehealth: Payer: Self-pay | Admitting: *Deleted

## 2017-12-26 NOTE — Telephone Encounter (Signed)
Received notification from [facility] that pt's Trokendi not covered by insurance. I called the patient and offered a savings card that may work for her Trokendi. She verbalized understanding and would like for it to be mailed to her home to take the pharmacy. She also wanted to r/s her one year office visit from 01/03/18. I r/s her for 01/09/18 @ 4:00 pm arrival time 3:30. She verbalized understanding and appreciation.   Trokendi savings card placed in addressed envelope and sent to medical records for mailing.

## 2018-01-03 ENCOUNTER — Ambulatory Visit: Payer: BLUE CROSS/BLUE SHIELD | Admitting: Neurology

## 2018-01-09 ENCOUNTER — Ambulatory Visit: Payer: BLUE CROSS/BLUE SHIELD | Admitting: Neurology

## 2018-01-09 ENCOUNTER — Encounter: Payer: Self-pay | Admitting: Neurology

## 2018-01-09 VITALS — BP 117/73 | HR 77 | Ht 67.5 in | Wt 186.0 lb

## 2018-01-09 DIAGNOSIS — I639 Cerebral infarction, unspecified: Secondary | ICD-10-CM

## 2018-01-09 DIAGNOSIS — G43611 Persistent migraine aura with cerebral infarction, intractable, with status migrainosus: Secondary | ICD-10-CM | POA: Diagnosis not present

## 2018-01-09 MED ORDER — KETOROLAC TROMETHAMINE 60 MG/2ML IM SOLN
30.0000 mg | Freq: Once | INTRAMUSCULAR | Status: AC
  Administered 2018-01-09: 30 mg via INTRAMUSCULAR

## 2018-01-09 MED ORDER — FREMANEZUMAB-VFRM 225 MG/1.5ML ~~LOC~~ SOSY: 1.0000 | PREFILLED_SYRINGE | SUBCUTANEOUS | 0 refills | Status: DC

## 2018-01-09 MED ORDER — FREMANEZUMAB-VFRM 225 MG/1.5ML ~~LOC~~ SOSY: 1.0000 | PREFILLED_SYRINGE | SUBCUTANEOUS | 11 refills | Status: DC

## 2018-01-09 NOTE — Progress Notes (Signed)
Patient given Toradol 30 mg injection in R deltoid. Bandaid applied. Tolerated well.

## 2018-01-09 NOTE — Progress Notes (Signed)
[facility]    Provider:  Dr Lucia Gaskins Referring Provider: [name], MD Primary Care Physician:  [name], MD   CC: migraines  Interval history; Migraines worsened in the fall. Increased to 15 headache days a month for over 6 months, increased Trokendi to 150mg  and didn't help. January has been better but in the last month she feels improved, still 15 headache days and still having 10 migraine days. They will start with auras. Imitrex helps.  She wants to try the Ajovy. She is post-menopausal which may be affecting her.   Interval history 01/03/2017: Her headaches are improved and she is doing wll on the Trokendi. She is on Nuvaring and she takes it out every 3 weeks and has a cycle then replaces it. Another option is to replace the Nuvaring every 3 weeks. She gets 3-4 migraines the week of her menses which is why replacing the Nuvaring every 3 weeks may help. No side effects to the Trokendi. These migraines are not as debilitating and the acute management helps. Husband is here and is very happy as well.   Interval history 06/22/2016:  Very nice 37 year old patient here for follow-up of migraines. Last seen almost a year ago, verapamil did not help her headaches, however the combination of Imitrex, and antiemetic and anti-inflammatory medication works great for acute management of migraines. She was recently diagnosed with ovarian failure. The migraines have changed since managing her ovarian failure, She gets migraines the week of her menses every day for a week and then a few other migraines the other weeks. Possibly 9-10 migraines a month. Verapamil didn't help. Husband had a vasectomy and she diagnosed with POI so no more kids. She tried Topamax in the past and it made her very tired and she would sleeps through hearing her children in the middle of the night. She is willing to try Topamax again. We discussed all the other options including other classes of medications  however Topamax is a great migraine preventative and she is willing to try again we can try the extended release version which has improved side effects.Discussed other classes of medications and side effects, she decide to try Topamax.  Tried: Topamax. Did not tolerate Topamax IR. Failed verapamil.    HPI: 37 year old female with Migrains. PMHx hypothyroidism due to Hashimoto's Thyroiditis, fatigue and tremor. She has had migraines since the age of 81. It was a cleaning product that caused it, resolved with changes in environmental factors and since then they come and go. They go in cycles, has them for a while then go away. Worsening the last 3 years. 3 migraines a week now. She tried Topamax and could not tolerate. Took fioricet in the past for acute management. Pain is on the right side of the head. Dizziness. Light headedness. Weakness in the arms after the headaches or during the headaches. Maybe numbness, like they are falling asleep. Left > right side affected. She had a head tremor for a few months that resolved. She had vertigo with her headaches a few weeks ago. She has had 3 occular migraines in her lifetime. The headaches last all day if she wakes up with it. If headache starts is in the afternoon, it may last 6 hours if she sleeps. Throbbing, light and sound sensitivity, nausea but rare vomiting.Dark room staying still helps. 7-8/10 at its worse. No auras. She feels mentally foggy with the migraines. No insomnia. No other focal neurologic deficits. Aunt with Parkinsons Dz.  Reviewed notes, labs and imaging from outside physicians, which showed: CMP 07/2015 unremarkable Creatinine 0.71. CBC nml, TSH nml, LDL 74,   Personally reviewed MRI brain:  Ventricle size normal. Cerebral volume normal. Low lying cerebellar tonsils approximately 5 mm without Chiari malformation. Pituitary normal in size. Negative for acute or chronic infarction Negative for demyelinating disease. Cerebral white  matter is normal. Brainstem and basal ganglia are normal. Negative for hemorrhage or mass lesion. Normal enhancement following contrast infusion. Paranasal sinuses are clear. IMPRESSION: Normal   Review of Systems: Patient complains of symptoms per HPI as well as the following symptoms: Fatigue, light sensitivity, cold sensitivity, nausea, food allergies, headache, weakness, daytime sleepiness. Pertinent negatives per HPI. All others negative.   Social History   Socioeconomic History  . Marital status: Married    Spouse name: Seymour Bars  . Number of children: 2  . Years of education: 82  . Highest education level: Not on file  Social Needs  . Financial resource strain: Not on file  . Food insecurity - worry: Not on file  . Food insecurity - inability: Not on file  . Transportation needs - medical: Not on file  . Transportation needs - non-medical: Not on file  Occupational History  . 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 Smoking status: Never Smoker  . Smokeless tobacco: Never Used  Substance and Sexual Activity  . Alcohol use: No  . Drug use: No  . Sexual activity: Not on file  Other Topics Concern  . Not on file  Social History Narrative   Lives at home with husband Seymour Bars and 2 children.    Caffeinse use: 2 cups coffee per day    Family History  Problem Relation Age of Onset  . Diabetes Son   . Hypothyroidism Son   . Stroke Maternal Grandmother   . Heart disease Maternal Grandfather   . Migraines Neg Hx     Past Medical History:  Diagnosis Date  . Eczema   . Fatigue   . Hypothyroidism, acquired, autoimmune   . Migraines   . Thyroiditis, autoimmune    Hashimotos     Past Surgical History:  Procedure Laterality Date  . MOLE REMOVAL  2010  . tongue growth  2010  . WISDOM TOOTH EXTRACTION  1999    Current Outpatient Medications  Medication Sig Dispense Refill  . cetirizine (ZYRTEC) 10 MG tablet Take 10 mg by mouth daily as needed.     .  etonogestrel-ethinyl estradiol (NUVARING) 0.12-0.015 MG/24HR vaginal ring Place 1 each vaginally every 28 (twenty-eight) days. Insert vaginally and leave in place for 3 consecutive weeks, then remove for 1 week.    . levothyroxine (SYNTHROID, LEVOTHROID) 137 MCG tablet TAKE 1 TABLET BY MOUTH ONCE DAILY AND 1/2 TABLET BY MOUTH TWICE A WEEK 35 tablet 5  . naproxen (NAPROSYN) 500 MG tablet Take 500 mg by mouth as needed.     . ondansetron (ZOFRAN-ODT) 4 MG disintegrating tablet DISSOLVE 1 TABLET(4 MG) UNDER THE TONGUE EVERY 8 HOURS AS NEEDED FOR NAUSEA 30 tablet 11  . SUMAtriptan (IMITREX) 100 MG tablet TAKE 1 TABLET(100 MG) BY MOUTH 1 TIME. MAY REPEAT IN 2 HOURS IF HEADACHE PERSISTS OR RECURS 10 tablet 11  . Topiramate ER (TROKENDI XR) 50 MG CP24 Take 150 mg by mouth at bedtime. 90 capsule 6  . TROKENDI XR 100 MG CP24 TK 1 C PO HS  11   No current facility-administered medications for this visit.  Allergies as of 01/09/2018 - Review Complete 12/15/2017  Allergen Reaction Noted  . Codeine  04/07/2011    Vitals: There were no vitals taken for this visit. Last Weight:  Wt Readings from Last 1 Encounters:  12/15/17 190 lb 9.6 oz (86.5 kg)   Last Height:   Ht Readings from Last 1 Encounters:  12/15/17 5' 7.6" (1.717 m)     Speech:  Speech is normal; fluent and spontaneous with normal comprehension.  Cognition:  The patient is oriented to person, place, and time;   recent and remote memory intact;   language fluent;   normal attention, concentration,   fund of knowledge Cranial Nerves:  The pupils are equal, round, and reactive to light. The fundi are normal and spontaneous venous pulsations are present. Visual fields are full to finger confrontation. Extraocular movements are intact. Trigeminal sensation is intact and the muscles of mastication are normal. The face is symmetric. The palate elevates in the midline. Hearing intact. Voice is normal. Shoulder shrug is  normal. The tongue has normal motion without fasciculations.   Coordination:  Normal finger to nose and heel to shin. Normal rapid alternating movements.   Gait:  Heel-toe and tandem gait are normal.   Motor Observation:  No asymmetry, no atrophy, and no involuntary movements noted. Tone:  Normal muscle tone.   Posture:  Posture is normal. normal erect   Strength:  Strength is V/V in the upper and lower limbs.    Sensation: intact to LT   Reflex Exam:  DTR's:  Deep tendon reflexes in the upper and lower extremities are normal bilaterally.  Toes:  The toes are downgoing bilaterally.  Clonus:  Clonus is absent.    Assessment/Plan: Patient with chronic migraines with aura, not intractable, without status. She has some basilar symptoms, she has had retinal migraines. Start Ajovy   - Remember to drink plenty of fluid, eat healthy meals and do not skip any meals. Try to eat protein with a every meal and eat a healthy snack such as fruit or nuts in between meals. Try to keep a regular sleep-wake schedule and try to exercise daily, particularly in the form of walking, 20-30 minutes a day, if you can.   As far as your medications are concerned, I would like to suggest: Continue Trokend. Start Ajovy.   - For her menstrual migraines, an option is to replace the Nuvaring every 3 weeks and not allow a week without Nuvaring since migraines are during week of period. She is doing well on this.   As far as diagnostic testing: None  I would like to see you back in 6 months, sooner if we need to. Please call us with any interim questions, concerns, problems, updates or refill requests.   Please also call us for any test results so we can go over those with you on the phone.  Discussed to prevent or relieve headaches, try the following:  Cool Compress. Lie down and place a cool compress on your head.   Avoid headache triggers. If certain  foods or odors seem to have triggered your migraines in the past, avoid them. A headache diary might help you identify triggers.   Include physical activity in your daily routine. Try a daily walk or other moderate aerobic exercise.   Manage stress. Find healthy ways to cope with the stressors, such as delegating tasks on your to-do list.   Practice relaxation techniques. Try deep breathing, yoga, massage and visualization.   Eat regularly.  Eating regularly scheduled meals and maintaining a healthy diet might help prevent headaches. Also, drink plenty of fluids.   Follow a regular sleep schedule. Sleep deprivation might contribute to headaches  Consider biofeedback. With this mind-body technique, you learn to control certain bodily functions - such as muscle tension, heart rate and blood pressure - to prevent headaches or reduce headache pain.    Proceed to emergency room if you experience new or worsening symptoms or symptoms do not resolve, if you have new neurologic symptoms or if headache is severe, or for any concerning symptom.    Will continue Imitrex with alleve and zofran for acute management. Did discuss at length that this medication is contraindicated in patients with hemiplegic or basilar type migraine due to risk of stroke or other vascular complications. However this is controversial and some studies do not should increased risk. Offered other acute management options. Patient acknowledges risks. If she has any side effects, would like her to stop and call us. Provided UpToDate patient drug information handout. Discussed teratogenicity.   Naomie Dean, MD  [facility] [address] [place], [place] [address]  Phone [phone] Fax [phone]  A total of 25 minutes was spent face-to-face with this patient. Over half this time was spent on counseling patient on the migraine diagnosis and different diagnostic and therapeutic  options available.

## 2018-01-11 ENCOUNTER — Encounter: Payer: Self-pay | Admitting: Neurology

## 2018-01-12 ENCOUNTER — Telehealth: Payer: Self-pay | Admitting: *Deleted

## 2018-01-12 ENCOUNTER — Telehealth: Payer: Self-pay | Admitting: Neurology

## 2018-01-12 MED ORDER — TOPIRAMATE ER 100 MG PO CAP24: 100.0000 mg | ORAL_CAPSULE | Freq: Every day | ORAL | 11 refills | Status: DC

## 2018-01-12 MED ORDER — TOPIRAMATE ER 50 MG PO CAP24: 50.0000 mg | ORAL_CAPSULE | Freq: Every day | ORAL | 11 refills | Status: DC

## 2018-01-12 NOTE — Addendum Note (Signed)
Addended by: Bertram SavinULBERTSON, BETHANY L on: 01/12/2018 05:16 PM   Modules accepted: Orders

## 2018-01-12 NOTE — Telephone Encounter (Signed)
[place] PodFYI Ariana from [employer] called to inform of the denial on pt's medication Trekendi XR 50 mg, she said also a fax was sent to office re: denial

## 2018-01-12 NOTE — Telephone Encounter (Signed)
Completed Trokendi XR 50 mg capsule PA on Cover My Meds Key: [id number].   Your information has been submitted to [facility] [place]. [employer] [place] will review the request and fax you a determination directly, typically within 3 business days of your submission once all necessary information is received. If [employer] [place] has not responded in 3 business days or if you have any questions about your submission, contact [employer]  at [phone].

## 2018-01-12 NOTE — Telephone Encounter (Signed)
Dr. Lucia GaskinsAhern aware that pt's Trokendi XR PA was denied due to the following reasons: pt has not tried & failed or did not tolerate meds such as Propranolol, Amitriptyline, or Divalproex Sodium/Sodium Valproate. The patient had also emailed us saying that she was unable to fill the medication at the pharmacy d/t quantity limit being exceeded. Dr. Lucia GaskinsAhern has offered to change pt's prescription to Trokendi XR 100 mg capsule daily at bedtime AND Trokendi XR 50 mg capsule daily at bedtime for total dose of 150 mg. I called the patient and discussed this. She verbalized understanding and appreciation and will see if the card can be applied to this new prescription at her pharmacy.   Trokendi XR 100 mg capsules ordered, # 30, refills 11 Trokendi  XR 50 mg capsules ordered, #30, refills 11

## 2018-01-13 ENCOUNTER — Telehealth: Payer: Self-pay | Admitting: *Deleted

## 2018-01-13 NOTE — Telephone Encounter (Signed)
Completed PA for Trokendi XR 100 mg capsules   Patient was also already given a $0 copay card   Key: [id number]   Your information has been submitted to [facility] [place]. [employer] [place] will review the request and fax you a determination directly, typically within 3 business days of your submission once all necessary information is received. If [employer] [place] has not responded in 3 business days or if you have any questions about your submission, contact [employer] [place] at [phone].

## 2018-01-16 NOTE — Telephone Encounter (Signed)
Received faxed notification from [employer] that Trokendi XR 100 mg capsules are denied due to the fact that patient has only tried Topiramate but has not tried other alternative medications such as propranolol, amitriptyline, or divalproex sodium/sodium valproate.   Patient was previously given a savings card which should allow to get 30 capsules monthly free regardless of PA denial.

## 2018-01-19 ENCOUNTER — Encounter: Payer: Self-pay | Admitting: *Deleted

## 2018-01-19 NOTE — Telephone Encounter (Signed)
Called [facility] and discussed Trokendi savings card instructions for overriding the insurance block. They were able to get the 100 mg capsules to $0 and will try with the 50 mg. RN informed pharmacy that medication rep stated that [employer] will buy down up to 30 capsules of each strength, so pt should get both 50 mg & 100 mg for free. [facility] to order the medication and have it ready for the patient tomorrow.

## 2018-04-07 DIAGNOSIS — E063 Autoimmune thyroiditis: Secondary | ICD-10-CM | POA: Diagnosis not present

## 2018-04-08 LAB — TSH: TSH: 1.95 m[IU]/L

## 2018-04-08 LAB — T3, FREE: T3 FREE: 2.4 pg/mL (ref 2.3–4.2)

## 2018-04-08 LAB — T4, FREE: FREE T4: 1.2 ng/dL (ref 0.8–1.8)

## 2018-04-11 ENCOUNTER — Other Ambulatory Visit (INDEPENDENT_AMBULATORY_CARE_PROVIDER_SITE_OTHER): Payer: Self-pay | Admitting: "Endocrinology

## 2018-04-11 DIAGNOSIS — E063 Autoimmune thyroiditis: Secondary | ICD-10-CM

## 2018-04-20 ENCOUNTER — Encounter (INDEPENDENT_AMBULATORY_CARE_PROVIDER_SITE_OTHER): Payer: Self-pay

## 2018-05-13 ENCOUNTER — Encounter: Payer: Self-pay | Admitting: Neurology

## 2018-05-16 ENCOUNTER — Telehealth: Payer: Self-pay | Admitting: *Deleted

## 2018-05-16 NOTE — Telephone Encounter (Signed)
Completed Ajovy 225 mg PA on Cover My Meds. KEY: [id number]. Anticipate a determination within 3 business days. If [employer] [place] has not responded in 3 business days or if you have any questions about your submission, contact [employer] [place] at [phone].

## 2018-05-18 NOTE — Telephone Encounter (Signed)
Per Cover my meds, Nancy [name] is denied. No explanation given. [facility].

## 2018-05-19 NOTE — Telephone Encounter (Signed)
[facility]. Ajovy is denied because medication is not on formulary and is covered when it is used to prevent chronic migraines or episodic migraines. Also, it is covered when two alternative medications have been tried: Water quality scientistAimovig and Emgality.

## 2018-05-19 NOTE — Telephone Encounter (Signed)
Yes please switch to aimovig 140 qmonthly

## 2018-05-22 NOTE — Telephone Encounter (Signed)
Called pt & LVM asking for call back.  

## 2018-05-23 NOTE — Telephone Encounter (Addendum)
Called pt & LVM with details (ok per DPR) asking for pt to call back to discuss her medication. Ajovy was denied by her insurance but Aimovig is preferred. Want to discuss switching to Aimovig per Dr. Lucia GaskinsAhern. Left office number in message.

## 2018-06-09 DIAGNOSIS — E559 Vitamin D deficiency, unspecified: Secondary | ICD-10-CM | POA: Diagnosis not present

## 2018-06-09 DIAGNOSIS — E063 Autoimmune thyroiditis: Secondary | ICD-10-CM | POA: Diagnosis not present

## 2018-06-12 LAB — PTH, INTACT AND CALCIUM
CALCIUM: 9.7 mg/dL (ref 8.6–10.2)
PTH: 11 pg/mL — ABNORMAL LOW (ref 14–64)

## 2018-06-12 LAB — T3, FREE: T3 FREE: 2.6 pg/mL (ref 2.3–4.2)

## 2018-06-12 LAB — TSH: TSH: 2.97 m[IU]/L

## 2018-06-12 LAB — T4, FREE: FREE T4: 1.4 ng/dL (ref 0.8–1.8)

## 2018-06-12 LAB — VITAMIN D 25 HYDROXY (VIT D DEFICIENCY, FRACTURES): Vit D, 25-Hydroxy: 38 ng/mL (ref 30–100)

## 2018-06-14 ENCOUNTER — Encounter (INDEPENDENT_AMBULATORY_CARE_PROVIDER_SITE_OTHER): Payer: Self-pay | Admitting: "Endocrinology

## 2018-06-14 ENCOUNTER — Ambulatory Visit (INDEPENDENT_AMBULATORY_CARE_PROVIDER_SITE_OTHER): Payer: BLUE CROSS/BLUE SHIELD | Admitting: "Endocrinology

## 2018-06-14 VITALS — BP 116/70 | HR 80 | Wt 172.0 lb

## 2018-06-14 DIAGNOSIS — E063 Autoimmune thyroiditis: Secondary | ICD-10-CM

## 2018-06-14 DIAGNOSIS — E288 Other ovarian dysfunction: Secondary | ICD-10-CM

## 2018-06-14 DIAGNOSIS — E559 Vitamin D deficiency, unspecified: Secondary | ICD-10-CM

## 2018-06-14 DIAGNOSIS — R5383 Other fatigue: Secondary | ICD-10-CM | POA: Diagnosis not present

## 2018-06-14 DIAGNOSIS — Z72820 Sleep deprivation: Secondary | ICD-10-CM | POA: Diagnosis not present

## 2018-06-14 DIAGNOSIS — E2839 Other primary ovarian failure: Secondary | ICD-10-CM

## 2018-06-14 DIAGNOSIS — E049 Nontoxic goiter, unspecified: Secondary | ICD-10-CM

## 2018-06-14 DIAGNOSIS — R252 Cramp and spasm: Secondary | ICD-10-CM | POA: Diagnosis not present

## 2018-06-14 NOTE — Progress Notes (Signed)
CC: FU of hypothyroidism, secondary to Hashimoto's thyroiditis, goiter, pallor, and fatigue.  HPI: [name] is a 37 y.o. Caucasian woman. She was unaccompanied.  1. [name] had her initial adult endocrine consultation with me on 02/03/11.:  A. She was diagnosed with hypothyroidism due to Hashimoto's Thyroiditis about September 2009, 6-7 months after the birth of her second child. She was started on levothyroxine.   B. When I saw her for that initial visit, she felt better on her Synthroid dose of 50 mcg/day on even-numbered days and 75 mcg/day on odd-numbered days, but was still cold. She had a 20+ gram gland. The remainder of her exam was normal.  2. During the past seven years we have gradually increased her dose of Synthroid in order to attain a TSH goal of 1.0-2.0. Her symptoms of coldness and fatigue have improved, but not totally resolved. Her weight has fluctuated during this time.  3. Her last clinic visit was on 12/15/17. At that visit I continued her Synthroid dose of one 137 mcg pill /day on two days each week, but 1.5 of the 137 mcg pills/day on 5 days each week.   A. In the interim she has been healthy. She has been more tired recently. She has also been having a pruritic rash in her hands and abdominal cramps when she takes dairy products.   1). She has had several vacation trips recently. She is doing better emotionally.    2). [name]'s migraines have improved greatly after taking Ajovy (an antibody) injections every 4-6 weeks. She is followed by Dr. Lucia Gaskins at [facility] and will have follow up visit soon.        3). Her last regular menstrual period was on July 20th 2016. 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    4). She is having fewer leg cramps and muscle spasms, but they still occur if she overstretches. She does not think that the cramps are associated with dehydration, because she drinks large amounts of water  daily.    5). She is still not sleeping well. She does not have insomnia, but she awakens 1-2 times each night. She thinks that she did better on trazodone when she took it previously.  Now that her son, Nancy [name], who has T1DM, has a CGM to monitor his BG values during the night, she no longer gets up to check on him. She has only one caffeinated drink per day.   B. She remains on Synthroid, 137 mcg/day on two days each week, but takes 1.5 of the 137 mcg pills/day on 5 days each week. She also takes Citracal-D daily at night and Topiramate at bedtime.   C. She works out 5-6 days per week for about 45 minutes each time.     4. Pertinent Review of Systems: Constitutional. The patient feels "pretty good, but a little bit tired". 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 Energy: Energy level is good in the mornings, but decreases about 2 PM.   Body temperature: The patient's body temperature is higher, but she is still "cold natured".  Weight: She has lost 18 pounds.   Eyes: The patient's vision is good. There are no problems with soreness, bulging, or limited range of eye movements. Neck: The patient is not aware of any problems relating to the anterior neck and thyroid bed. There have been no significant problems  with swelling, pain, soreness, tenderness, pressure, discomfort, or difficulty swallowing. Heart: The patient feels the expected increase in heart rate during exercise or other physical activities. There have been no significant problems with palpitations, irregular heart beats, chest pain, or chest pressure. Gastrointestinal: She thinks that she is developing dairy intolerance. Bowel movements are normal. There are no significant complaints of excessive hunger, acid reflux, upset stomach, stomach aches or pains, diarrhea, or constipation. Musculoskeletal: She has still occasionally been having calf cramps in the evenings. The calf  cramps occur if she stretches her legs or ankles too much. If she overstretches her ankles she can also develop spasms of the dorsa of her feet and in the toes. Muscles and extremities appear to be working normally. There are no significant problems with hand tremor, sweaty palms, palmar erythema, or lower leg swelling. Psychological: She is doing much better.  Mental: Her brain fog varies with the amount of good sleep that she gets. She has no problems with her abilities to pay attention, to remember, to think, and to make decisions.      GYN: As above.   PAST MEDICAL, FAMILY, AND SOCIAL HISTORY: 1. Family and Work: She is still at home taking care of her children. The family still lives in [place], [place], where her husband, Nancy [name], is a Optician, dispensing. Her sister was also diagnosed with hypothyroidism secondary to Hashimoto's thyroiditis. She has a grand aunt who had Parkinson's Dz.  2. Activities: She has been exercising much more in the past 6 months.   3. Tobacco, alcohol, and drugs: None 4. PCP: Dr. [name]  REVIEW OF SYSTEMS: Ms. dwayne begay has no other significant issues involving her other body systems.  PHYSICAL EXAM: BP 116/70   Pulse 80   Wt 172 lb (78 kg)   LMP 01/15/2015 (Exact Date)   BMI 26.54 kg/m  She has lost 18 pounds.   Constitutional: The patient looks healthy, trimmer, and quite good today. She is bright and alert. Her affect and insight are normal.   Head: I do not detect any head tremor today.    Eyes: There is no arcus or proptosis.  Mouth: The oropharynx appears normal. The tongue appears normal. There is normal oral moisture. There is no obvious gingivitis. There is no hyperpigmentation.  Neck: There are no bruits present. The thyroid gland appears normal on inspection. The thyroid gland is again enlarged at about 20-21 grams. The left lobe is only very mildly enlarged. The right lobe is normal in size. The consistency of the thyroid gland is normal. There is no  thyroid tenderness to palpation. Lungs: The lungs are clear. Air movement is good. Heart: The heart rhythm and rate appear normal. Heart sounds S1 and S2 are normal. I do not appreciate any pathologic heart murmurs. Abdomen: The abdomen is normal in size today. Bowel sounds are normal. The abdomen is soft and non-tender. There is no obviously palpable hepatomegaly, splenomegaly, or other masses.  Arms: Muscle mass appears appropriate for age.  Hands: She does not have any tremor today. Phalangeal and metacarpophalangeal joints appear normal. Palms are warm. She has no pallor of the fingers when she flexes her fingers.The nails remain dystrophic.She does not have any hyperpigmentation.  Legs: Muscle mass appears appropriate for age. There is no edema.  Neurologic: Muscle strength is normal for age and gender in both the upper and the lower extremities. Muscle tone appears normal. Sensation to touch is normal in the legs.  LAB DATA:  Labs 06/09/18: TSH 2.97, free T4 1.4, free T3 2.6; PTH 11, calcium 9.7, 25-OH vitamin 38  Labs 04/07/18: TSH 1.95, free T4 1.4, free T3 2.6  Labs 12/08/17: TSH 1.53, free T4 1.4, free T3 2.5  Labs 06/02/17: TSH 1.49, free T4 1.5, free T3 2.6; 1,25-OH vitamin D 70 (ref 18-72), 25-OH vitamin D 29, magnesium 2.1 (ref 1.5-2.5), phosphorus 3.0 (ref 2.5-4.5), PTH 25 (ref 14-64), calcium 9.3 (8.6-10.2); CMP normal   Labs 02/16/17: TSH 2.62, free T4 1.4, free T3 2.9  11/16/16: TSH 2.85, free T4 1.2, free T3 2.6  08/17/16: TSH 2.234, free T4 1.4, free T3 2.4  04/14/16: TSH 2.90, free T4 1.4, free T3 2.6  10/08/15: TSH 1.098, free T4 1.10, free T3 3.0; CBC normal, iron 97  08/20/15: TSH 1.513, free T4 1.34, free T3 2.8  04/09/15: TSH 2.563, free T4 1.29, free T3 2.6; WBC 4.3, Hgb 13.3, Hct 39.9%; iron 95  10/07/14: TSH 2.478, free T4 1.39, free T3 2.8  04/03/14: TSH 4.281, free T4 1.27, free T3 3.0  10/04/13: TSH 2.393, free T4 1.34, free T3 2.9  03/16/13: TSH 2.862,  free T4 1.14, free T3 2.7  01/22/13: TSH 4.261, free T4 1.14, free T3 2.9  06/21/12: TSH 2.427, free T4 1.11, free T3 3.1  03/20/12: TSH 2.980, free T4 1.14, free T3 3.1  ASSESSMENT: 1. Hypothyroid:   A. Patient is again clinically euthyroid,and she is again chemically euthyroid, although the TSH is now above the goal range of 1.0-2.0. She is again doing quite well symptomatically, although her poor sleep still seems to cause fatigue.   B. Although her TSH is higher, her free T4 and free T3 are normal and unchanged. She may have had a recent, mild flare up of thyroiditis.   C. It appears that Baxter HireKristen is one of those people who really needs to keep her TSH between 1.0-2.0, which is the ideal goal range for TSH for patients who are on Synthroid replacement. However, given the likelihood that her current TSH has been influenced by a recent flare up of thyroiditis, I will not increase her Synthroid dose at this time.    D. It also appears that Baxter HireKristen is continuing to lose thyrocytes over time, but not necessarily in a steadily progressive pattern.   E. We will continue to check her TFTs every 3 months and see her every 6 months in follow up.  2. Thyroiditis: Her thyroiditis is clinically quiescent today, but intermittently active. 3. Goiter: Her goiter is again enlarged today, at about the same size as at her last visit. The process of waxing and waning of thyroid gland size over time is c/w evolving Hashimoto's disease.  4. Pallor: Resolved. 5. Fatigue: This problem improved greatly after her TFTs normalized, but is still dependent upon her obtaining enough good quality sleep. She does not have any clinical stigmata of Addison's Dz.  6. Head tremor: I do not see any evidence of head tremor today. 7. Tremor: I do not see any hand tremor today.    8. 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 With the addition of Topiramate XR and  Ajovy, the frequency and severity of her migraines have markedly decreased.   9. Poor sleep: We both remember that she did better when she took trazodone several years ago. It may be worthwhile to start her back on trazodone, but I do not want to do anything that will adversely  interact with her migraine regimen. I asked her to discuss with Dr. Lucia Gaskins about whether or not to start trazodone.  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 This problem has become increasingly more common in the population, paralleling the increasing incidence of autoimmune thyroid disease and other autoimmune diseases in the population at large.  11. Muscle cramps and spasms: The cramps and spasms have been occurring less frequently, but still bother her.   B. Given the autoimmune nature of her hypothyroidism and POF, it was quite possible that she might be developing autoimmune hypoparathyroidism. Fortunately, in June 2018 her PTH, calcium, magnesium, phosphorus, and calcitriol levels were normal. Her vitamin D level was a bit low. Since then the vitamin D level has improved and the calcium and PTH are still good as of June 2019.  12. Vitamin D deficiency: Because she has been reducing her dairy intake, and because her intestine absorbs vitamin D less well over time, she needs exogenous vitamin D. I suggested that she take one Citracal tablet daily with 500-600 mg of calcium and 400 IU of vitamin D per day. Her vitamin D level is now normal.   Plan:  1. Diagnostic: TFTs, CMP, and CBC in 3 months and TFTs, calcium, PTH, and vitamin D in 6 months. I put in the orders for the 24-month and 51-month tests.  2. Therapeutic: Continue the Synthroid dose of 137 mcg/da for two days each week and 1.5 pills/day on five days per week. Continue one Citracal, 600/400 tablet daily.  3. Patient education: We discussed the fact that she is gradually losing thyroid cells over time. Therefore we will need to check her TFTs every 3-6 months and increase  the dose of Synthroid as needed. We also discussed the need to draw labs to assess whether or not she seems to be developing autoimmune hypoparathyroidism. 4. Follow-up: FU in six months.   Level of Service: This visit lasted in excess of 55 minutes. More than 50% of the visit was devoted to counseling.  Molli Knock, MD, CDE Adult and Pediatric Endocrinology

## 2018-06-14 NOTE — Patient Instructions (Signed)
Follow up visit in 6 months. Please repeat lab tests in 3 and 6 months.  

## 2018-07-17 ENCOUNTER — Ambulatory Visit: Payer: BLUE CROSS/BLUE SHIELD | Admitting: Neurology

## 2018-07-17 ENCOUNTER — Encounter: Payer: Self-pay | Admitting: Neurology

## 2018-07-17 VITALS — BP 115/78 | HR 79 | Ht 67.5 in | Wt 173.0 lb

## 2018-07-17 DIAGNOSIS — G479 Sleep disorder, unspecified: Secondary | ICD-10-CM | POA: Diagnosis not present

## 2018-07-17 DIAGNOSIS — G43709 Chronic migraine without aura, not intractable, without status migrainosus: Secondary | ICD-10-CM | POA: Diagnosis not present

## 2018-07-17 DIAGNOSIS — G43009 Migraine without aura, not intractable, without status migrainosus: Secondary | ICD-10-CM | POA: Diagnosis not present

## 2018-07-17 MED ORDER — SUMATRIPTAN SUCCINATE 100 MG PO TABS: ORAL_TABLET | ORAL | 11 refills | Status: DC

## 2018-07-17 MED ORDER — AMITRIPTYLINE HCL 10 MG PO TABS: ORAL_TABLET | ORAL | 11 refills | Status: DC

## 2018-07-17 MED ORDER — GABAPENTIN 300 MG PO CAPS: ORAL_CAPSULE | ORAL | 11 refills | Status: DC

## 2018-07-17 MED ORDER — ONDANSETRON 4 MG PO TBDP: ORAL_TABLET | ORAL | 11 refills | Status: DC

## 2018-07-17 MED ORDER — FREMANEZUMAB-VFRM 225 MG/1.5ML ~~LOC~~ SOSY: 1.0000 | PREFILLED_SYRINGE | SUBCUTANEOUS | 11 refills | Status: DC

## 2018-07-17 MED ORDER — TOPIRAMATE ER 100 MG PO CAP24: 100.0000 mg | ORAL_CAPSULE | Freq: Every day | ORAL | 11 refills | Status: DC

## 2018-07-17 MED ORDER — TOPIRAMATE ER 50 MG PO CAP24: 50.0000 mg | ORAL_CAPSULE | Freq: Every day | ORAL | 11 refills | Status: DC

## 2018-07-17 NOTE — Patient Instructions (Signed)
Continue Medications Try Gabapentin or Amitriptyline at bedtime (do not use at the same time)  Amitriptyline tablets What is this medicine? AMITRIPTYLINE (a mee TRIP ti leen) is used to treat depression. This medicine may be used for other purposes; ask your health care provider or pharmacist if you have questions. COMMON BRAND NAME(S): Elavil, Vanatrip What should I tell my health care provider before I take this medicine? They need to know if you have any of these conditions: -an alcohol problem -asthma, difficulty breathing -bipolar disorder or schizophrenia -difficulty passing urine, prostate trouble -glaucoma -heart disease or previous heart attack -liver disease -over active thyroid -seizures -thoughts or plans of suicide, a previous suicide attempt, or family history of suicide attempt -an unusual or allergic reaction to amitriptyline, other medicines, foods, dyes, or preservatives -pregnant or trying to get pregnant -breast-feeding How should I use this medicine? Take this medicine by mouth with a drink of water. Follow the directions on the prescription label. You can take the tablets with or without food. Take your medicine at regular intervals. Do not take it more often than directed. Do not stop taking this medicine suddenly except upon the advice of your doctor. Stopping this medicine too quickly may cause serious side effects or your condition may worsen. A special MedGuide will be given to you by the pharmacist with each prescription and refill. Be sure to read this information carefully each time. Talk to your pediatrician regarding the use of this medicine in children. Special care may be needed. Overdosage: If you think you have taken too much of this medicine contact a poison control center or emergency room at once. NOTE: This medicine is only for you. Do not share this medicine with others. What if I miss a dose? If you miss a dose, take it as soon as you can. If it  is almost time for your next dose, take only that dose. Do not take double or extra doses. What may interact with this medicine? Do not take this medicine with any of the following medications: -arsenic trioxide -certain medicines used to regulate abnormal heartbeat or to treat other heart conditions -cisapride -droperidol -halofantrine -linezolid -MAOIs like Carbex, Eldepryl, Marplan, Nardil, and Parnate -methylene blue -other medicines for mental depression -phenothiazines like perphenazine, thioridazine and chlorpromazine -pimozide -probucol -procarbazine -sparfloxacin -St. John's Wort -ziprasidone This medicine may also interact with the following medications: -atropine and related drugs like hyoscyamine, scopolamine, tolterodine and others -barbiturate medicines for inducing sleep or treating seizures, like phenobarbital -cimetidine -disulfiram -ethchlorvynol -thyroid hormones such as levothyroxine This list may not describe all possible interactions. Give your health care provider a list of all the medicines, herbs, non-prescription drugs, or dietary supplements you use. Also tell them if you smoke, drink alcohol, or use illegal drugs. Some items may interact with your medicine. What should I watch for while using this medicine? Tell your doctor if your symptoms do not get better or if they get worse. Visit your doctor or health care professional for regular checks on your progress. Because it may take several weeks to see the full effects of this medicine, it is important to continue your treatment as prescribed by your doctor. Patients and their families should watch out for new or worsening thoughts of suicide or depression. Also watch out for sudden changes in feelings such as feeling anxious, agitated, panicky, irritable, hostile, aggressive, impulsive, severely restless, overly excited and hyperactive, or not being able to sleep. If this happens, especially at the beginning  of treatment or after a change in dose, call your health care professional. Bonita Quin may get drowsy or dizzy. Do not drive, use machinery, or do anything that needs mental alertness until you know how this medicine affects you. Do not stand or sit up quickly, especially if you are an older patient. This reduces the risk of dizzy or fainting spells. Alcohol may interfere with the effect of this medicine. Avoid alcoholic drinks. Do not treat yourself for coughs, colds, or allergies without asking your doctor or health care professional for advice. Some ingredients can increase possible side effects. Your mouth may get dry. Chewing sugarless gum or sucking hard candy, and drinking plenty of water will help. Contact your doctor if the problem does not go away or is severe. This medicine may cause dry eyes and blurred vision. If you wear contact lenses you may feel some discomfort. Lubricating drops may help. See your eye doctor if the problem does not go away or is severe. This medicine can cause constipation. Try to have a bowel movement at least every 2 to 3 days. If you do not have a bowel movement for 3 days, call your doctor or health care professional. This medicine can make you more sensitive to the sun. Keep out of the sun. If you cannot avoid being in the sun, wear protective clothing and use sunscreen. Do not use sun lamps or tanning beds/booths. What side effects may I notice from receiving this medicine? Side effects that you should report to your doctor or health care professional as soon as possible: -allergic reactions like skin rash, itching or hives, swelling of the face, lips, or tongue -anxious -breathing problems -changes in vision -confusion -elevated mood, decreased need for sleep, racing thoughts, impulsive behavior -eye pain -fast, irregular heartbeat -feeling faint or lightheaded, falls -feeling agitated, angry, or irritable -fever with increased sweating -hallucination, loss of  contact with reality -seizures -stiff muscles -suicidal thoughts or other mood changes -tingling, pain, or numbness in the feet or hands -trouble passing urine or change in the amount of urine -trouble sleeping -unusually weak or tired -vomiting -yellowing of the eyes or skin Side effects that usually do not require medical attention (report to your doctor or health care professional if they continue or are bothersome): -change in sex drive or performance -change in appetite or weight -constipation -dizziness -dry mouth -nausea -tired -tremors -upset stomach This list may not describe all possible side effects. Call your doctor for medical advice about side effects. You may report side effects to FDA at 1-800-FDA-1088. Where should I keep my medicine? Keep out of the reach of children. Store at room temperature between 20 and 25 degrees C (68 and 77 degrees F). Throw away any unused medicine after the expiration date. NOTE: This sheet is a summary. It may not cover all possible information. If you have questions about this medicine, talk to your doctor, pharmacist, or health care provider.  2018 [employer]/Gold Standard (2016-04-30 12:14:15)   Gabapentin capsules or tablets What is this medicine? GABAPENTIN (GA ba pen tin) is used to control partial seizures in adults with epilepsy. It is also used to treat certain types of nerve pain. This medicine may be used for other purposes; ask your health care provider or pharmacist if you have questions. COMMON BRAND NAME(S): Active-PAC with Gabapentin, Gabarone, Neurontin What should I tell my health care provider before I take this medicine? They need to know if you have any of these conditions: -kidney disease -suicidal  thoughts, plans, or attempt; a previous suicide attempt by you or a family member -an unusual or allergic reaction to gabapentin, other medicines, foods, dyes, or preservatives -pregnant or trying to get  pregnant -breast-feeding How should I use this medicine? Take this medicine by mouth with a glass of water. Follow the directions on the prescription label. You can take it with or without food. If it upsets your stomach, take it with food.Take your medicine at regular intervals. Do not take it more often than directed. Do not stop taking except on your doctor's advice. If you are directed to break the 600 or 800 mg tablets in half as part of your dose, the extra half tablet should be used for the next dose. If you have not used the extra half tablet within 28 days, it should be thrown away. A special MedGuide will be given to you by the pharmacist with each prescription and refill. Be sure to read this information carefully each time. Talk to your pediatrician regarding the use of this medicine in children. Special care may be needed. Overdosage: If you think you have taken too much of this medicine contact a poison control center or emergency room at once. NOTE: This medicine is only for you. Do not share this medicine with others. What if I miss a dose? If you miss a dose, take it as soon as you can. If it is almost time for your next dose, take only that dose. Do not take double or extra doses. What may interact with this medicine? Do not take this medicine with any of the following medications: -other gabapentin products This medicine may also interact with the following medications: -alcohol -antacids -antihistamines for allergy, cough and cold -certain medicines for anxiety or sleep -certain medicines for depression or psychotic disturbances -homatropine; hydrocodone -naproxen -narcotic medicines (opiates) for pain -phenothiazines like chlorpromazine, mesoridazine, prochlorperazine, thioridazine This list may not describe all possible interactions. Give your health care provider a list of all the medicines, herbs, non-prescription drugs, or dietary supplements you use. Also tell them  if you smoke, drink alcohol, or use illegal drugs. Some items may interact with your medicine. What should I watch for while using this medicine? Visit your doctor or health care professional for regular checks on your progress. You may want to keep a record at home of how you feel your condition is responding to treatment. You may want to share this information with your doctor or health care professional at each visit. You should contact your doctor or health care professional if your seizures get worse or if you have any new types of seizures. Do not stop taking this medicine or any of your seizure medicines unless instructed by your doctor or health care professional. Stopping your medicine suddenly can increase your seizures or their severity. Wear a medical identification bracelet or chain if you are taking this medicine for seizures, and carry a card that lists all your medications. You may get drowsy, dizzy, or have blurred vision. Do not drive, use machinery, or do anything that needs mental alertness until you know how this medicine affects you. To reduce dizzy or fainting spells, do not sit or stand up quickly, especially if you are an older patient. Alcohol can increase drowsiness and dizziness. Avoid alcoholic drinks. Your mouth may get dry. Chewing sugarless gum or sucking hard candy, and drinking plenty of water will help. The use of this medicine may increase the chance of suicidal thoughts or actions. Pay  special attention to how you are responding while on this medicine. Any worsening of mood, or thoughts of suicide or dying should be reported to your health care professional right away. Women who become pregnant while using this medicine may enroll in the [place] Pregnancy Registry by calling [phone]. This registry collects information about the safety of antiepileptic drug use during pregnancy. What side effects may I notice from receiving this  medicine? Side effects that you should report to your doctor or health care professional as soon as possible: -allergic reactions like skin rash, itching or hives, swelling of the face, lips, or tongue -worsening of mood, thoughts or actions of suicide or dying Side effects that usually do not require medical attention (report to your doctor or health care professional if they continue or are bothersome): -constipation -difficulty walking or controlling muscle movements -dizziness -nausea -slurred speech -tiredness -tremors -weight gain This list may not describe all possible side effects. Call your doctor for medical advice about side effects. You may report side effects to FDA at 1-800-FDA-1088. Where should I keep my medicine? Keep out of reach of children. This medicine may cause accidental overdose and death if it taken by other adults, children, or pets. Mix any unused medicine with a substance like cat litter or coffee grounds. Then throw the medicine away in a sealed container like a sealed bag or a coffee can with a lid. Do not use the medicine after the expiration date. Store at room temperature between 15 and 30 degrees C (59 and 86 degrees F). NOTE: This sheet is a summary. It may not cover all possible information. If you have questions about this medicine, talk to your doctor, pharmacist, or health care provider.  2018 [employer]vier/Gold Standard (2014-01-25 15:26:50)

## 2018-07-17 NOTE — Progress Notes (Signed)
[facility]    Provider:  Dr Lucia Gaskins Referring Provider: [name], MD Primary Care Physician:  [name], MD   CC: migraines  Interval history: Migraines/headaches about 50% better. 6 headache days a month and not as bad, her acute management helps. She has not had to fill imitrex monthly maybe every few months. New problem, difficulty maintaining sleep, discussed sleep hygiene.  Interval history; Migraines worsened in the fall. Increased to 15 headache days a month for over 6 months, increased Trokendi to 150mg  and didn't help. January has been better but in the last month she feels improved, still 15 headache days and still having 10 migraine days. They will start with auras. Imitrex helps.  She wants to try the Ajovy. She is post-menopausal which may be affecting her.   Interval history 01/03/2017: Her headaches are improved and she is doing wll on the Trokendi. She is on Nuvaring and she takes it out every 3 weeks and has a cycle then replaces it. Another option is to replace the Nuvaring every 3 weeks. She gets 3-4 migraines the week of her menses which is why replacing the Nuvaring every 3 weeks may help. No side effects to the Trokendi. These migraines are not as debilitating and the acute management helps. Husband is here and is very happy as well.   Interval history 06/22/2016:  Very nice 37 year old patient here for follow-up of migraines. Last seen almost a year ago, verapamil did not help her headaches, however the combination of Imitrex, and antiemetic and anti-inflammatory medication works great for acute management of migraines. She was recently diagnosed with ovarian failure. The migraines have changed since managing her ovarian failure, She gets migraines the week of her menses every day for a week and then a few other migraines the other weeks. Possibly 9-10 migraines a month. Verapamil didn't help. Husband had a vasectomy and she diagnosed with POI so  no more kids. She tried Topamax in the past and it made her very tired and she would sleeps through hearing her children in the middle of the night. She is willing to try Topamax again. We discussed all the other options including other classes of medications however Topamax is a great migraine preventative and she is willing to try again we can try the extended release version which has improved side effects.Discussed other classes of medications and side effects, she decide to try Topamax.  Tried: Topamax. Did not tolerate Topamax IR. Failed verapamil. Gabapentin. Amitriptyline.    HPI: 37 year old female with Migrains. PMHx hypothyroidism due to Hashimoto's Thyroiditis, fatigue and tremor. She has had migraines since the age of 31. It was a cleaning product that caused it, resolved with changes in environmental factors and since then they come and go. They go in cycles, has them for a while then go away. Worsening the last 3 years. 3 migraines a week now. She tried Topamax and could not tolerate. Took fioricet in the past for acute management. Pain is on the right side of the head. Dizziness. Light headedness. Weakness in the arms after the headaches or during the headaches. Maybe numbness, like they are falling asleep. Left > right side affected. She had a head tremor for a few months that resolved. She had vertigo with her headaches a few weeks ago. She has had 3 occular migraines in her lifetime. The headaches last all day if she wakes up with it. If headache starts is in the afternoon, it may last 6 hours  if she sleeps. Throbbing, light and sound sensitivity, nausea but rare vomiting.Dark room staying still helps. 7-8/10 at its worse. No auras. She feels mentally foggy with the migraines. No insomnia. No other focal neurologic deficits. Aunt with Parkinsons Dz.   Reviewed notes, labs and imaging from outside physicians, which showed: CMP 07/2015 unremarkable Creatinine 0.71. CBC nml, TSH nml, LDL  74,   Personally reviewed MRI brain:  Ventricle size normal. Cerebral volume normal. Low lying cerebellar tonsils approximately 5 mm without Chiari malformation. Pituitary normal in size. Negative for acute or chronic infarction Negative for demyelinating disease. Cerebral white matter is normal. Brainstem and basal ganglia are normal. Negative for hemorrhage or mass lesion. Normal enhancement following contrast infusion. Paranasal sinuses are clear. IMPRESSION: Normal   Review of Systems: Patient complains of symptoms per HPI as well as the following symptoms: Fatigue, light sensitivity, cold sensitivity, nausea, food allergies, headache, weakness, daytime sleepiness. Pertinent negatives per HPI. All others negative.   Social History   Socioeconomic History  . Marital status: Married    Spouse name: Seymour Bars  . Number of children: 2  . Years of education: 24  . Highest education level: Not on file  Occupational History  . 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 Financial resource strain: Not on file  . Food insecurity:    Worry: Not on file    Inability: Not on file  . Transportation needs:    Medical: Not on file    Non-medical: Not on file  Tobacco Use  . Smoking status: Never Smoker  . Smokeless tobacco: Never Used  Substance and Sexual Activity  . Alcohol use: No  . Drug use: No  . Sexual activity: Not on file  Lifestyle  . Physical activity:    Days per week: Not on file    Minutes per session: Not on file  . Stress: Not on file  Relationships  . Social connections:    Talks on phone: Not on file    Gets together: Not on file    Attends religious service: Not on file    Active member of club or organization: Not on file    Attends meetings of clubs or organizations: Not on file    Relationship status: Not on file  . Intimate partner violence:    Fear of current or ex partner: Not on file    Emotionally abused: Not on file    Physically  abused: Not on file    Forced sexual activity: Not on file  Other Topics Concern  . Not on file  Social History Narrative   Lives at home with husband Seymour Bars and 2 children.    Caffeine use: 1 cup of coffee per day   Right handed    Family History  Problem Relation Age of Onset  . Stroke Mother   . Diabetes Son   . Hypothyroidism Son   . Stroke Maternal Grandmother   . Heart disease Maternal Grandfather   . Migraines Neg Hx     Past Medical History:  Diagnosis Date  . Eczema   . Fatigue   . Hypothyroidism, acquired, autoimmune   . Migraines   . Thyroiditis, autoimmune    Hashimotos     Past Surgical History:  Procedure Laterality Date  . MOLE REMOVAL  2010  . tongue growth  2010  . WISDOM TOOTH EXTRACTION  1999    Current Outpatient Medications  Medication Sig Dispense Refill  . Calcium-Phosphorus-Vitamin D (CITRACAL +  D3 PO) Take 2 tablets by mouth daily. Each tablet has Calcium 600 mg & D3 500 mg    . etonogestrel-ethinyl estradiol (NUVARING) 0.12-0.015 MG/24HR vaginal ring Place 1 each vaginally every 28 (twenty-eight) days. Insert vaginally and leave in place for 28 days then replace.    . Fremanezumab-vfrm (AJOVY) 225 MG/1.5ML SOSY Inject 1 Syringe into the skin every 30 (thirty) days. 1 Syringe 11  . levothyroxine (SYNTHROID, LEVOTHROID) 137 MCG tablet Take 1.5 tablets 5 days a week and 1 tablet 2 days a week 41 tablet 5  . naproxen (NAPROSYN) 500 MG tablet Take 500 mg by mouth as needed.     . ondansetron (ZOFRAN-ODT) 4 MG disintegrating tablet DISSOLVE 1 TABLET(4 MG) UNDER THE TONGUE EVERY 8 HOURS AS NEEDED FOR NAUSEA 30 tablet 11  . SUMAtriptan (IMITREX) 100 MG tablet TAKE 1 TABLET(100 MG) BY MOUTH 1 TIME. MAY REPEAT IN 2 HOURS IF HEADACHE PERSISTS OR RECURS 10 tablet 11  . Topiramate ER (TROKENDI XR) 100 MG CP24 Take 100 mg by mouth at bedtime. Take with Trokendi XR 50 mg for total dose of 150 mg daily at bedtime 30 capsule 11  . Topiramate ER (TROKENDI XR) 50 MG  CP24 Take 50 mg by mouth at bedtime. Take with Trokendi 100 mg capsule for total dose of 150 mg daily at bedtime 30 capsule 11  . amitriptyline (ELAVIL) 10 MG tablet Tale 1-2 tablets before bedtime 60 tablet 11  . cetirizine (ZYRTEC) 10 MG tablet Take 10 mg by mouth daily as needed.     . gabapentin (NEURONTIN) 300 MG capsule Take 1-2 an hour before bedtime 60 capsule 11   No current facility-administered medications for this visit.     Allergies as of 07/17/2018 - Review Complete 07/17/2018  Allergen Reaction Noted  . Codeine  04/07/2011    Vitals: BP 115/78 (BP Location: Right Arm, Patient Position: Sitting)   Pulse 79   Ht 5' 7.5" (1.715 m)   Wt 173 lb (78.5 kg)   LMP 01/15/2015 (Exact Date)   BMI 26.70 kg/m  Last Weight:  Wt Readings from Last 1 Encounters:  07/17/18 173 lb (78.5 kg)   Last Height:   Ht Readings from Last 1 Encounters:  07/17/18 5' 7.5" (1.715 m)     Speech:  Speech is normal; fluent and spontaneous with normal comprehension.  Cognition:  The patient is oriented to person, place, and time;   recent and remote memory intact;   language fluent;   normal attention, concentration,   fund of knowledge Cranial Nerves:  The pupils are equal, round, and reactive to light. The fundi are normal and spontaneous venous pulsations are present. Visual fields are full to finger confrontation. Extraocular movements are intact. Trigeminal sensation is intact and the muscles of mastication are normal. The face is symmetric. The palate elevates in the midline. Hearing intact. Voice is normal. Shoulder shrug is normal. The tongue has normal motion without fasciculations.   Coordination:  Normal finger to nose and heel to shin. Normal rapid alternating movements.   Gait:  Heel-toe and tandem gait are normal.   Motor Observation:  No asymmetry, no atrophy, and no involuntary movements noted. Tone:  Normal muscle tone.   Posture:   Posture is normal. normal erect   Strength:  Strength is V/V in the upper and lower limbs.    Sensation: intact to LT   Reflex Exam:  DTR's:  Deep tendon reflexes in the upper and lower extremities are normal  bilaterally.  Toes:  The toes are downgoing bilaterally.  Clonus:  Clonus is absent.    Assessment/Plan: Patient with chronic migraines with aura, not intractable, without status. She has some basilar symptoms, she has had retinal migraines. Continue Ajovy   - Remember to drink plenty of fluid, eat healthy meals and do not skip any meals. Try to eat protein with a every meal and eat a healthy snack such as fruit or nuts in between meals. Try to keep a regular sleep-wake schedule and try to exercise daily, particularly in the form of walking, 20-30 minutes a day, if you can.   Migraines and Night time awakenings: gabapentin 1-2 tabs prior to bed however if she wants to try Trazodone that is fine as well. Try Amitriptyline as well this is a great migraine medication and may help with sleep,  As far as your migraine prevention medications are concerned, I would like to suggest: Continue Trokend. Continue Ajovy. Try amitiptyline and gabapentin  - For her menstrual migraines, an option is to replace the Nuvaring every 3 weeks and not allow a week without Nuvaring since migraines are during week of period. She is doing well on this.   As far as diagnostic testing: None  I would like to see you back in 1 year , sooner if we need to. Please call us with any interim questions, concerns, problems, updates or refill requests.   Please also call us for any test results so we can go over those with you on the phone.  Discussed to prevent or relieve headaches, try the following:  Cool Compress. Lie down and place a cool compress on your head.   Avoid headache triggers. If certain foods or odors seem to have triggered your migraines in the past, avoid  them. A headache diary might help you identify triggers.   Include physical activity in your daily routine. Try a daily walk or other moderate aerobic exercise.   Manage stress. Find healthy ways to cope with the stressors, such as delegating tasks on your to-do list.   Practice relaxation techniques. Try deep breathing, yoga, massage and visualization.   Eat regularly. Eating regularly scheduled meals and maintaining a healthy diet might help prevent headaches. Also, drink plenty of fluids.   Follow a regular sleep schedule. Sleep deprivation might contribute to headaches  Consider biofeedback. With this mind-body technique, you learn to control certain bodily functions - such as muscle tension, heart rate and blood pressure - to prevent headaches or reduce headache pain.    Proceed to emergency room if you experience new or worsening symptoms or symptoms do not resolve, if you have new neurologic symptoms or if headache is severe, or for any concerning symptom.    Will continue Imitrex with alleve and zofran for acute management. Did discuss at length that this medication is contraindicated in patients with hemiplegic or basilar type migraine due to risk of stroke or other vascular complications. However this is controversial and some studies do not should increased risk. Offered other acute management options. Patient acknowledges risks. If she has any side effects, would like her to stop and call us. Provided UpToDate patient drug information handout. Discussed teratogenicity.   Naomie DeanAntonia Ahern, MD  [facility] [address] [place], [place] [id number]  Phone [phone] Fax [phone]  A total of 25 minutes was spent face-to-face with this patient. Over half this time was spent on counseling patient on the  1. Chronic migraine without aura  without status migrainosus, not intractable   2. Migraine without aura and without status migrainosus,  not intractable   3. Sleeping difficulty    diagnosis and different diagnostic and therapeutic options available.

## 2018-09-25 ENCOUNTER — Encounter: Payer: Self-pay | Admitting: *Deleted

## 2018-09-26 ENCOUNTER — Other Ambulatory Visit (HOSPITAL_COMMUNITY)
Admission: RE | Admit: 2018-09-26 | Discharge: 2018-09-26 | Disposition: A | Discharge status: Home / Self Care | Payer: BLUE CROSS/BLUE SHIELD | Source: Ambulatory Visit | Attending: Nurse Practitioner | Admitting: Nurse Practitioner

## 2018-09-26 ENCOUNTER — Other Ambulatory Visit: Payer: Self-pay | Admitting: Nurse Practitioner

## 2018-09-26 DIAGNOSIS — R252 Cramp and spasm: Secondary | ICD-10-CM | POA: Diagnosis not present

## 2018-09-26 DIAGNOSIS — Z23 Encounter for immunization: Secondary | ICD-10-CM | POA: Diagnosis not present

## 2018-09-26 DIAGNOSIS — Z01419 Encounter for gynecological examination (general) (routine) without abnormal findings: Secondary | ICD-10-CM | POA: Diagnosis not present

## 2018-09-26 DIAGNOSIS — R5383 Other fatigue: Secondary | ICD-10-CM | POA: Diagnosis not present

## 2018-09-26 DIAGNOSIS — E063 Autoimmune thyroiditis: Secondary | ICD-10-CM | POA: Diagnosis not present

## 2018-09-27 LAB — COMPREHENSIVE METABOLIC PANEL
AG Ratio: 1.6 (calc) (ref 1.0–2.5)
ALT: 10 U/L (ref 6–29)
AST: 12 U/L (ref 10–30)
Albumin: 4.4 g/dL (ref 3.6–5.1)
Alkaline phosphatase (APISO): 30 U/L — ABNORMAL LOW (ref 33–115)
BUN: 18 mg/dL (ref 7–25)
CO2: 24 mmol/L (ref 20–32)
Calcium: 9.1 mg/dL (ref 8.6–10.2)
Chloride: 108 mmol/L (ref 98–110)
Creat: 0.82 mg/dL (ref 0.50–1.10)
GLUCOSE: 88 mg/dL (ref 65–99)
Globulin: 2.8 g/dL (calc) (ref 1.9–3.7)
Potassium: 3.9 mmol/L (ref 3.5–5.3)
Sodium: 140 mmol/L (ref 135–146)
Total Bilirubin: 0.4 mg/dL (ref 0.2–1.2)
Total Protein: 7.2 g/dL (ref 6.1–8.1)

## 2018-09-27 LAB — CBC WITH DIFFERENTIAL/PLATELET
BASOS ABS: 42 {cells}/uL (ref 0–200)
Basophils Relative: 1 %
EOS ABS: 29 {cells}/uL (ref 15–500)
Eosinophils Relative: 0.7 %
HCT: 38 % (ref 35.0–45.0)
HEMOGLOBIN: 12.8 g/dL (ref 11.7–15.5)
LYMPHS ABS: 1554 {cells}/uL (ref 850–3900)
MCH: 29.2 pg (ref 27.0–33.0)
MCHC: 33.7 g/dL (ref 32.0–36.0)
MCV: 86.6 fL (ref 80.0–100.0)
MPV: 11.4 fL (ref 7.5–12.5)
Monocytes Relative: 5.5 %
NEUTROS ABS: 2344 {cells}/uL (ref 1500–7800)
Neutrophils Relative %: 55.8 %
PLATELETS: 188 10*3/uL (ref 140–400)
RBC: 4.39 10*6/uL (ref 3.80–5.10)
RDW: 12.2 % (ref 11.0–15.0)
Total Lymphocyte: 37 %
WBC: 4.2 10*3/uL (ref 3.8–10.8)
WBCMIX: 231 {cells}/uL (ref 200–950)

## 2018-09-27 LAB — TSH: TSH: 5.37 mIU/L — ABNORMAL HIGH

## 2018-09-27 LAB — T4, FREE: FREE T4: 1 ng/dL (ref 0.8–1.8)

## 2018-09-27 LAB — T3, FREE: T3 FREE: 2.2 pg/mL — AB (ref 2.3–4.2)

## 2018-09-28 LAB — CYTOLOGY - PAP
DIAGNOSIS: NEGATIVE
HPV (WINDOPATH): NOT DETECTED

## 2018-10-08 ENCOUNTER — Other Ambulatory Visit (INDEPENDENT_AMBULATORY_CARE_PROVIDER_SITE_OTHER): Payer: Self-pay | Admitting: "Endocrinology

## 2018-10-08 DIAGNOSIS — E063 Autoimmune thyroiditis: Secondary | ICD-10-CM

## 2018-10-09 ENCOUNTER — Telehealth (INDEPENDENT_AMBULATORY_CARE_PROVIDER_SITE_OTHER): Payer: Self-pay

## 2018-10-09 NOTE — Telephone Encounter (Signed)
-----   Message from Michael J Brennan, MD sent at 10/09/2018  1:30 PM EDT ----- Thyroid tests are low. CMP was normal, except for low alkaline phosphatase. CBC was normal. Please increase the synthroid to 1.5 of the 137 mcg tablets per day every day. We'll discuss the lowish alkaline phosphatase at the next visit. 

## 2018-10-09 NOTE — Telephone Encounter (Signed)
Spoke with patient and let her know per Dr. Fransico Michael "Thyroid tests are low. CMP was normal, except for low alkaline phosphatase. CBC was normal. Please increase the synthroid to 1.5 of the 137 mcg tablets per day every day. We'll discuss the lowish alkaline phosphatase at the next visit." Patient states understanding and was able to correctly state the new medication dose before ending the call.

## 2018-10-09 NOTE — Telephone Encounter (Signed)
Left voicemail to call back so we may relay lab results. Rx refill request came in earlier today, new rx sent in.

## 2018-10-09 NOTE — Telephone Encounter (Signed)
-----   Message from David Stall, MD sent at 10/09/2018  1:30 PM EDT ----- Thyroid tests are low. CMP was normal, except for low alkaline phosphatase. CBC was normal. Please increase the synthroid to 1.5 of the 137 mcg tablets per day every day. We'll discuss the lowish alkaline phosphatase at the next visit.

## 2018-11-21 ENCOUNTER — Telehealth: Payer: Self-pay | Admitting: *Deleted

## 2018-11-21 ENCOUNTER — Encounter: Payer: Self-pay | Admitting: *Deleted

## 2018-11-21 NOTE — Telephone Encounter (Signed)
Received approval of Trokendi XR 100 mg capsule through 11/19/2021. For any changes call [phone].   Messaged pt in mychart to let her know.

## 2018-11-21 NOTE — Telephone Encounter (Signed)
Completed Trokendi XR 100 mg PA on Cover My Meds. KEY: [id number].  Tried: Topamax. Did not tolerate Topamax IR. Failed verapamil. Gabapentin. Amitriptyline.    If [employer] [place] has not responded in 3 business days or if you have any questions about your submission, contact [employer] [place] at [phone].  Spoke with pt and she stated that Amitriptyline really hasn't helped her headaches. She stated that she is doing well on the Ajovy and would like to continue using it if the copay card will allow however she is open to trying Aimovig if needed. She doesn't care which one she is on.

## 2018-11-21 NOTE — Telephone Encounter (Signed)
Completed PA for Trokendi XR 50 mg on Cover My Meds. KEY: [id number]  If [employer] [place] has not responded in 3 business days or if you have any questions about your submission, contact [employer] [place] at [phone].

## 2018-11-23 ENCOUNTER — Encounter: Payer: Self-pay | Admitting: *Deleted

## 2018-11-23 NOTE — Telephone Encounter (Signed)
Spoke with Melissa at [employer] because the Trokendi XR 50 mg PA got canceled on Cover My Meds and was labeled as a duplicate. She stated that the Trokendi XR 100 mg approval was "wildcarded" so the patient can actually get any strength. They ran a test claim for both the 100 mg and 50 mg capsules and it went through.   Messaged pt through [employer].

## 2018-11-25 ENCOUNTER — Other Ambulatory Visit: Payer: Self-pay | Admitting: Neurology

## 2018-11-25 DIAGNOSIS — G43009 Migraine without aura, not intractable, without status migrainosus: Secondary | ICD-10-CM

## 2018-11-29 DIAGNOSIS — R252 Cramp and spasm: Secondary | ICD-10-CM | POA: Diagnosis not present

## 2018-11-29 DIAGNOSIS — E063 Autoimmune thyroiditis: Secondary | ICD-10-CM | POA: Diagnosis not present

## 2018-11-29 DIAGNOSIS — E559 Vitamin D deficiency, unspecified: Secondary | ICD-10-CM | POA: Diagnosis not present

## 2018-11-30 LAB — PTH, INTACT AND CALCIUM
CALCIUM: 9.1 mg/dL (ref 8.6–10.2)
PTH: 20 pg/mL (ref 14–64)

## 2018-11-30 LAB — T4, FREE: FREE T4: 1.1 ng/dL (ref 0.8–1.8)

## 2018-11-30 LAB — VITAMIN D 25 HYDROXY (VIT D DEFICIENCY, FRACTURES): VIT D 25 HYDROXY: 33 ng/mL (ref 30–100)

## 2018-11-30 LAB — T3, FREE: T3 FREE: 2.3 pg/mL (ref 2.3–4.2)

## 2018-11-30 LAB — TSH: TSH: 4.82 m[IU]/L — AB

## 2018-12-04 ENCOUNTER — Telehealth (INDEPENDENT_AMBULATORY_CARE_PROVIDER_SITE_OTHER): Payer: Self-pay

## 2018-12-04 DIAGNOSIS — E063 Autoimmune thyroiditis: Secondary | ICD-10-CM

## 2018-12-04 MED ORDER — CALCIUM-MAGNESIUM-VITAMIN D ER 600-40-500 MG-MG-UNIT PO TB24: ORAL_TABLET | ORAL | 1 refills | Status: AC

## 2018-12-04 MED ORDER — LEVOTHYROXINE SODIUM 112 MCG PO TABS: ORAL_TABLET | ORAL | 1 refills | Status: DC

## 2018-12-04 NOTE — Telephone Encounter (Signed)
Spoke with patient and let her know per Dr. Fransico MichaelBrennan "Thyroid tests are still hypothyroid, but somewhat better. PTH and calcium are normal, but I'd like to see a higher calcium value. Vitamin D is normal, but low-normal. She needs more Synthroid and more vitamin D. Please take two Synthroid 112 mcg tablets each morning. Please take one Citracal (600/400) tablet at dinner and one at bedtime. Repeat lab tests in 2 months."  Patient states understanding and was able to repeat the correct medication change.

## 2018-12-04 NOTE — Telephone Encounter (Signed)
-----   Message from David StallMichael J Brennan, MD sent at 12/03/2018 10:52 PM EST ----- Thyroid tests are still hypothyroid, but somewhat better. PTH and calcium are normal, but I'd like to see a higher calcium value. Vitamin D is normal, but low-normal. She needs more Synthroid and more vitamin D. Please take two Synthroid 112 mcg tablets each morning. Please take one Citracal (600/400) tablet at dinner and one at bedtime. Repeat lab tests in 2 months.  Clinical staff: Please submit prescription for Synthroid 112 mcg tablets, take two each morning, with 5 refills. Please order TSH, free T4, free T3, calcium, PTH, and 25-OH vitamin D to be done in 2 months. Thanks.  Dr. Fransico MichaelBrennan

## 2018-12-15 ENCOUNTER — Ambulatory Visit (INDEPENDENT_AMBULATORY_CARE_PROVIDER_SITE_OTHER): Payer: BLUE CROSS/BLUE SHIELD | Admitting: "Endocrinology

## 2018-12-21 ENCOUNTER — Telehealth: Payer: Self-pay | Admitting: *Deleted

## 2018-12-21 NOTE — Telephone Encounter (Signed)
Completed PA for Ajovy 225 mg on Cover My Meds. KEY: [id number]. Awaiting [employer] determination. Meds tried: Tried: Topamax. Did not tolerate Topamax IR. Failed verapamil. Gabapentin. Amitriptyline.   "If [employer] has not responded in 3 business days or if you have any questions about your submission, contact [employer] [place] at [phone]."

## 2018-12-21 NOTE — Telephone Encounter (Signed)
[employer] and spoke with [place].    [employer] [place] ID: [id number] BIN: [id number] GROUP: [id number]

## 2018-12-22 NOTE — Telephone Encounter (Signed)
Was confused by the message checked cover my meds. This medication was approved for the patient from 11/21/2018 through 11/19/2021

## 2018-12-22 NOTE — Telephone Encounter (Signed)
Ariel W @ [employer] called to inform that yesterday afternoon they faxed over a denial for the approval of the medication.

## 2019-01-02 ENCOUNTER — Encounter: Payer: Self-pay | Admitting: *Deleted

## 2019-01-25 ENCOUNTER — Encounter (INDEPENDENT_AMBULATORY_CARE_PROVIDER_SITE_OTHER): Payer: Self-pay

## 2019-02-06 DIAGNOSIS — E063 Autoimmune thyroiditis: Secondary | ICD-10-CM | POA: Diagnosis not present

## 2019-02-07 LAB — CALCIUM: Calcium: 9.1 mg/dL (ref 8.6–10.2)

## 2019-02-07 LAB — TSH: TSH: 0.14 mIU/L — ABNORMAL LOW

## 2019-02-07 LAB — PARATHYROID HORMONE, INTACT (NO CA): PTH: 15 pg/mL (ref 14–64)

## 2019-02-07 LAB — VITAMIN D 25 HYDROXY (VIT D DEFICIENCY, FRACTURES): Vit D, 25-Hydroxy: 34 ng/mL (ref 30–100)

## 2019-02-07 LAB — T3, FREE: T3, Free: 3.2 pg/mL (ref 2.3–4.2)

## 2019-02-07 LAB — T4, FREE: Free T4: 1.7 ng/dL (ref 0.8–1.8)

## 2019-02-14 ENCOUNTER — Encounter (INDEPENDENT_AMBULATORY_CARE_PROVIDER_SITE_OTHER): Payer: Self-pay | Admitting: "Endocrinology

## 2019-02-14 ENCOUNTER — Ambulatory Visit (INDEPENDENT_AMBULATORY_CARE_PROVIDER_SITE_OTHER): Payer: BLUE CROSS/BLUE SHIELD | Admitting: "Endocrinology

## 2019-02-14 VITALS — BP 118/76 | HR 88 | Ht 67.32 in | Wt 184.2 lb

## 2019-02-14 DIAGNOSIS — R5383 Other fatigue: Secondary | ICD-10-CM | POA: Diagnosis not present

## 2019-02-14 DIAGNOSIS — E049 Nontoxic goiter, unspecified: Secondary | ICD-10-CM

## 2019-02-14 DIAGNOSIS — Z72820 Sleep deprivation: Secondary | ICD-10-CM | POA: Diagnosis not present

## 2019-02-14 DIAGNOSIS — E559 Vitamin D deficiency, unspecified: Secondary | ICD-10-CM

## 2019-02-14 DIAGNOSIS — E063 Autoimmune thyroiditis: Secondary | ICD-10-CM | POA: Diagnosis not present

## 2019-02-14 NOTE — Progress Notes (Signed)
CC: FU of hypothyroidism, secondary to Hashimoto's thyroiditis, goiter, pallor, and fatigue.  HPI: [name] is a 38 y.o. Caucasian woman. She was unaccompanied.  1. Nancy [name] had her initial adult endocrine consultation with me on 02/03/11.:  A. She was diagnosed with hypothyroidism due to Hashimoto's Thyroiditis in about September 2009, 6-7 months after the birth of her second child. She was started on levothyroxine.   B. When I saw her for that initial visit, she felt better on her Synthroid dose of 50 mcg/day on even-numbered days and 75 mcg/day on odd-numbered days, but was still cold. She had a 20+ gram gland. The remainder of her exam was normal.  2. During the past eight years we have gradually increased her dose of Synthroid in order to attain a TSH goal of 1.0-2.0. Her symptoms of coldness and fatigue have improved, but not totally resolved. Her weight has fluctuated during this time.  3. Her last clinic visit was on 06/14/18. At that visit I continued her Synthroid dose of one 137 mcg pill /day on two days each week, but 1.5 of the 137 mcg pills/day on 5 days each week. In October I changed her dose. In December I changed her dose again, this time to 224 mcg/day. Her insurance has mandated levothyroxine rather than Synthroid.   A. In the interim she has been healthy.    1). She has been less tired since January. She started working outside the home again on a part-time basis. Her new puppy is not sleeping well, so she sometimes does not sleep well. She is also a full-time wife and mother.  [name] 2). [name]'s migraines have improved greatly after taking Ajovy (an antibody) injections, now about once a month and much less severe. She is followed by Dr. Lucia Gaskins at [facility] and will have follow up visit soon.        3). Her last regular menstrual period was on July 20th 2016. 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    4). She is not  having many leg cramps and muscle spasms, in part due to exercising less.     5). She was sleeping pretty well before they bought the new puppy. She does not have insomnia, but she awakens 1-2 times each night unless she takes gabapentin. Now that her son, Nancy [name], who has T1DM, has a CGM to monitor his BG values during the night, she no longer gets up to check on him. She has only one caffeinated drink per day.   B. She remains on Synthroid, 224 mcg/day. She still takes Citracal-D, Gabapentin, and Topiramate at bedtime.   C. She no longer works out very often.      4. Pertinent Review of Systems: Constitutional. The patient feels "better in the past month".   Energy: Energy level is good in the mornings, but decreases in the afternoons.    Body temperature: The patient's body temperature is higher, but she is still "cold natured".  Weight: She has re-gained 12 pounds.   Eyes: The patient's vision is good. There are no problems with soreness, bulging, or limited range of eye movements. Neck: The patient is not aware of any problems relating to the anterior neck and thyroid bed. There have been no significant problems with swelling, pain, soreness, tenderness, pressure, discomfort, or difficulty swallowing. Heart: The patient feels the expected increase in heart rate during exercise or other physical activities. There have been no significant problems  with palpitations, irregular heart beats, chest pain, or chest pressure. Gastrointestinal: She thinks that she has dairy intolerance. Bowel movements are normal. There are no significant complaints of excessive hunger, acid reflux, upset stomach, stomach aches or pains, diarrhea, or constipation. Musculoskeletal: She has still occasionally been having calf cramps in the evenings. The calf cramps occur if she stretches her legs or ankles too much. If she overstretches her ankles she can also develop spasms of the dorsa of her feet and in the toes. Muscles and  extremities appear to be working normally. There are no significant problems with hand tremor, sweaty palms, palmar erythema, or lower leg swelling. Psychological: She is doing "good".   Mental: Her brain fog varies with the amount of good sleep that she gets. She has no problems with her abilities to pay attention, to remember, to think, and to make decisions in the mornings, but does not do as well when she fatigues later in the day.       GYN: As above.   PAST MEDICAL, FAMILY, AND SOCIAL HISTORY: 1. 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 The family still lives in [place], [place], where her husband, Seymour Bars, is a Optician, dispensing. Her sister was also diagnosed with hypothyroidism secondary to Hashimoto's thyroiditis. She has a grand aunt who had Parkinson's Dz.  2. Activities: She has not been exercising much in the past 6 months.   3. Tobacco, alcohol, and drugs: None 4. PCP: Dr. [name]  REVIEW OF SYSTEMS: [name] has no other significant issues involving her other body systems.  PHYSICAL EXAM: BP 118/76   Pulse 88   Ht 5' 7.32" (1.71 m)   Wt 184 lb 3.2 oz (83.6 kg)   LMP 01/15/2015 (Exact Date)   BMI 28.57 kg/m     Constitutional: The patient looks healthy, but somewhat heavier today. She has re-gained 12 pounds. She is bright and alert. Her affect and insight are normal.   Head: I do not detect any head tremor today.    Eyes: There is no arcus or proptosis.  Mouth: The oropharynx appears normal. The tongue appears normal. There is normal oral moisture. There is no obvious gingivitis. There is no hyperpigmentation.  Neck: There are no bruits present. The thyroid gland appears normal on inspection. The thyroid gland is again minimally enlarged at about 20-21 grams. The left lobe is only very mildly enlarged. The right lobe is normal in size. The consistency of the thyroid gland is normal. There is no thyroid tenderness to  palpation. Lungs: The lungs are clear. Air movement is good. Heart: The heart rhythm and rate appear normal. Heart sounds S1 and S2 are normal. I do not appreciate any pathologic heart murmurs. Abdomen: The abdomen is normal in size today. Bowel sounds are normal. The abdomen is soft and non-tender. There is no obviously palpable hepatomegaly, splenomegaly, or other masses.  Arms: Muscle mass appears appropriate for age.  Hands: She does not have any tremor today. Phalangeal and metacarpophalangeal joints appear normal. Palms are warm. She has no pallor of the fingers when she flexes her fingers.The nails remain dystrophic.She does not have any hyperpigmentation.  Legs: Muscle mass appears appropriate for age. There is no edema.  Neurologic: Muscle strength is normal for age and gender in both the upper and the lower extremities. Muscle tone appears normal. Sensation to touch is normal in the legs.  LAB DATA:   Labs 02/06/19: TSH 0.14, free T4 1.7, free T3 3.2; PTH 15, calcium 9.1, 25-OH vitamin D 34  Labs 11/29/18: TSH 4.82, free T4 1.1, free T3 2.3  Labs 09/26/18: TSH 5.37, free T4 1.0, free T3 2.2  Labs 06/09/18: TSH 2.97, free T4 1.4, free T3 2.6; PTH 11, calcium 9.7, 25-OH vitamin 38  Labs 04/07/18: TSH 1.95, free T4 1.4, free T3 2.6  Labs 12/08/17: TSH 1.53, free T4 1.4, free T3 2.5  Labs 06/02/17: TSH 1.49, free T4 1.5, free T3 2.6; 1,25-OH vitamin D 70 (ref 18-72), 25-OH vitamin D 29, magnesium 2.1 (ref 1.5-2.5), phosphorus 3.0 (ref 2.5-4.5), PTH 25 (ref 14-64), calcium 9.3 (8.6-10.2); CMP normal   Labs 02/16/17: TSH 2.62, free T4 1.4, free T3 2.9  11/16/16: TSH 2.85, free T4 1.2, free T3 2.6  08/17/16: TSH 2.234, free T4 1.4, free T3 2.4  04/14/16: TSH 2.90, free T4 1.4, free T3 2.6  10/08/15: TSH 1.098, free T4 1.10, free T3 3.0; CBC normal, iron 97  08/20/15: TSH 1.513, free T4 1.34, free T3 2.8  04/09/15: TSH 2.563, free T4 1.29, free T3 2.6; WBC 4.3, Hgb 13.3, Hct 39.9%; iron  95  10/07/14: TSH 2.478, free T4 1.39, free T3 2.8  04/03/14: TSH 4.281, free T4 1.27, free T3 3.0  10/04/13: TSH 2.393, free T4 1.34, free T3 2.9  03/16/13: TSH 2.862, free T4 1.14, free T3 2.7  01/22/13: TSH 4.261, free T4 1.14, free T3 2.9  06/21/12: TSH 2.427, free T4 1.11, free T3 3.1  03/20/12: TSH 2.980, free T4 1.14, free T3 3.1  ASSESSMENT: 1. Hypothyroid:   A. Patient is again clinically euthyroid, but is chemically hyperthyroid on her new dosage of 224 mcg/day.  B. We need to reduce her LT4 dose by one 122 mcg pill per week.   C. It appears that Nancy [name] is one of those people who really needs to keep her TSH between 1.0-2.0, which is the ideal goal range for TSH for patients who are on Synthroid replacement.   D. It also appears that Nancy [name] is continuing to lose thyrocytes over time, but not necessarily in a steadily progressive pattern.   E. We will continue to check her TFTs every 3 months and see her every 6 months in follow up.  2. Thyroiditis: Her thyroiditis is clinically quiescent today, but intermittently active. 3. Goiter: Her goiter is again mildly enlarged today, at about the same size as at her last visit. The process of waxing and waning of thyroid gland size over time is c/w evolving Hashimoto's disease.  4. Pallor: Resolved. 5. Fatigue: This problem improved greatly from December to January and February as her thyroxine level increased. In the interim she has new puppy that doesn't sleep through the night, so she has not been sleeping well.   6. Head tremor: I do not see any evidence of head tremor today. 7. Tremor: I do not see any hand tremor today.    8. 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 With the addition of Topiramate XR and Ajovy, the frequency and severity of her migraines have markedly decreased.   9. Poor sleep: She was doing better with gabapentin, but not as well recently due to  the new puppy's needs.    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 This problem has become increasingly more common in the population, paralleling the increasing incidence of autoimmune thyroid disease  and other autoimmune diseases in the population at large.  11. Muscle cramps and spasms:   A. The cramps and spasms have been occurring less frequently, but still bother her.   B. Given the autoimmune nature of her hypothyroidism and POF, it was quite possible that she might be developing autoimmune hypoparathyroidism. Fortunately, in June 2018 her PTH, calcium, magnesium, phosphorus, and calcitriol levels were normal. Her vitamin D level was a bit low. Since then the vitamin D level has improved and the calcium and PTH are still good as of June 2019 and February 2020.  12. Vitamin D deficiency: Because she has been reducing her dairy intake, and because her intestine absorbs vitamin D less well over time, she needs exogenous vitamin D. I suggested that she take one Citracal tablet daily with 500-600 mg of calcium and 400 IU of vitamin D per day. Her vitamin D level is now normal, but low normal.   Plan:  1. Diagnostic: TFTs in 3 months. TFTs, calcium, PTH, and vitamin D in 6 months. I put in the orders for the 22-month and 62-month tests.  2. Therapeutic: Change levothyroxine to two of the 112 mcg pills per day for 6 days each week, but take only one pill per day on the seventh days. Continue one Citracal, 600/400 tablet daily.  3. Patient education: We discussed the fact that she is gradually losing thyroid cells over time. Therefore we will need to check her TFTs every 3-6 months and increase the dose of Synthroid as needed. We also discussed the need to draw labs to assess whether or not she seems to be developing autoimmune hypoparathyroidism. 4. Follow-up: FU in six months.   Level of Service: This visit lasted in excess of 60 minutes. More than 50% of the visit was devoted to counseling.  Molli Knock, MD, CDE Adult and Pediatric Endocrinology

## 2019-02-14 NOTE — Patient Instructions (Signed)
Follow up visit in 6 months. Please change the levothyroxine dose to two 112 mcg tablets/day for 6 days each week, but take only one table/day on the seventh days. Please repeat lab tests in 3 months and again about 1-2 weeks prior to next visit.

## 2019-03-04 DIAGNOSIS — L03114 Cellulitis of left upper limb: Secondary | ICD-10-CM | POA: Diagnosis not present

## 2019-03-04 DIAGNOSIS — W57XXXA Bitten or stung by nonvenomous insect and other nonvenomous arthropods, initial encounter: Secondary | ICD-10-CM | POA: Diagnosis not present

## 2019-03-04 DIAGNOSIS — Z6829 Body mass index (BMI) 29.0-29.9, adult: Secondary | ICD-10-CM | POA: Diagnosis not present

## 2019-04-11 DIAGNOSIS — F4323 Adjustment disorder with mixed anxiety and depressed mood: Secondary | ICD-10-CM | POA: Diagnosis not present

## 2019-04-16 DIAGNOSIS — F4323 Adjustment disorder with mixed anxiety and depressed mood: Secondary | ICD-10-CM | POA: Diagnosis not present

## 2019-04-24 DIAGNOSIS — F4323 Adjustment disorder with mixed anxiety and depressed mood: Secondary | ICD-10-CM | POA: Diagnosis not present

## 2019-04-30 DIAGNOSIS — F4323 Adjustment disorder with mixed anxiety and depressed mood: Secondary | ICD-10-CM | POA: Diagnosis not present

## 2019-05-08 DIAGNOSIS — F4323 Adjustment disorder with mixed anxiety and depressed mood: Secondary | ICD-10-CM | POA: Diagnosis not present

## 2019-05-15 DIAGNOSIS — F4323 Adjustment disorder with mixed anxiety and depressed mood: Secondary | ICD-10-CM | POA: Diagnosis not present

## 2019-05-21 ENCOUNTER — Other Ambulatory Visit (INDEPENDENT_AMBULATORY_CARE_PROVIDER_SITE_OTHER): Payer: Self-pay | Admitting: "Endocrinology

## 2019-05-21 DIAGNOSIS — E063 Autoimmune thyroiditis: Secondary | ICD-10-CM | POA: Diagnosis not present

## 2019-05-21 LAB — T4, FREE: Free T4: 1.4 ng/dL (ref 0.8–1.8)

## 2019-05-21 LAB — T3, FREE: T3, Free: 2.9 pg/mL (ref 2.3–4.2)

## 2019-05-21 LAB — TSH: TSH: 0.17 mIU/L — ABNORMAL LOW

## 2019-05-22 DIAGNOSIS — F4323 Adjustment disorder with mixed anxiety and depressed mood: Secondary | ICD-10-CM | POA: Diagnosis not present

## 2019-05-24 DIAGNOSIS — F4323 Adjustment disorder with mixed anxiety and depressed mood: Secondary | ICD-10-CM | POA: Diagnosis not present

## 2019-05-25 ENCOUNTER — Telehealth (INDEPENDENT_AMBULATORY_CARE_PROVIDER_SITE_OTHER): Payer: Self-pay

## 2019-05-25 DIAGNOSIS — E063 Autoimmune thyroiditis: Secondary | ICD-10-CM

## 2019-05-25 DIAGNOSIS — E049 Nontoxic goiter, unspecified: Secondary | ICD-10-CM

## 2019-05-25 MED ORDER — LEVOTHYROXINE SODIUM 112 MCG PO TABS: ORAL_TABLET | ORAL | 1 refills | Status: DC

## 2019-05-25 NOTE — Telephone Encounter (Signed)
-----   Message from Sherrlyn Hock, MD sent at 05/24/2019 11:23 AM EDT ----- According to the TSH value, you are hyperthyroid. According to the free T4 and free T3 values, however, you are normal thyroid at lower levels than three months ago, but these levels may still be too much for you. Please reduce the levothyroxine dose to two of the 112 mcg pills per day on 4 days each week, but take only one of the 112 mcg pills per day on three days per week.   Clinical staff: Please order repeat TSH, free T4, and free T3 to be drawn in early August. Thanks. Dr. Tobe Sos

## 2019-05-28 ENCOUNTER — Other Ambulatory Visit (INDEPENDENT_AMBULATORY_CARE_PROVIDER_SITE_OTHER): Payer: Self-pay | Admitting: "Endocrinology

## 2019-05-28 DIAGNOSIS — F4323 Adjustment disorder with mixed anxiety and depressed mood: Secondary | ICD-10-CM | POA: Diagnosis not present

## 2019-05-28 DIAGNOSIS — E063 Autoimmune thyroiditis: Secondary | ICD-10-CM

## 2019-06-05 DIAGNOSIS — F4323 Adjustment disorder with mixed anxiety and depressed mood: Secondary | ICD-10-CM | POA: Diagnosis not present

## 2019-06-12 DIAGNOSIS — F4323 Adjustment disorder with mixed anxiety and depressed mood: Secondary | ICD-10-CM | POA: Diagnosis not present

## 2019-06-19 DIAGNOSIS — F4323 Adjustment disorder with mixed anxiety and depressed mood: Secondary | ICD-10-CM | POA: Diagnosis not present

## 2019-06-26 DIAGNOSIS — F4323 Adjustment disorder with mixed anxiety and depressed mood: Secondary | ICD-10-CM | POA: Diagnosis not present

## 2019-07-06 ENCOUNTER — Other Ambulatory Visit: Payer: Self-pay | Admitting: Neurology

## 2019-07-10 DIAGNOSIS — F4323 Adjustment disorder with mixed anxiety and depressed mood: Secondary | ICD-10-CM | POA: Diagnosis not present

## 2019-07-17 DIAGNOSIS — F4323 Adjustment disorder with mixed anxiety and depressed mood: Secondary | ICD-10-CM | POA: Diagnosis not present

## 2019-07-23 ENCOUNTER — Other Ambulatory Visit (INDEPENDENT_AMBULATORY_CARE_PROVIDER_SITE_OTHER): Payer: Self-pay | Admitting: "Endocrinology

## 2019-07-23 ENCOUNTER — Ambulatory Visit: Payer: BLUE CROSS/BLUE SHIELD | Admitting: Neurology

## 2019-07-23 DIAGNOSIS — E063 Autoimmune thyroiditis: Secondary | ICD-10-CM

## 2019-07-25 ENCOUNTER — Encounter: Payer: Self-pay | Admitting: Neurology

## 2019-07-25 ENCOUNTER — Ambulatory Visit: Payer: BLUE CROSS/BLUE SHIELD | Admitting: Neurology

## 2019-07-25 ENCOUNTER — Other Ambulatory Visit: Payer: Self-pay

## 2019-07-25 DIAGNOSIS — G43009 Migraine without aura, not intractable, without status migrainosus: Secondary | ICD-10-CM

## 2019-07-25 MED ORDER — TROKENDI XR 100 MG PO CP24: 100.0000 mg | ORAL_CAPSULE | Freq: Every day | ORAL | 11 refills | Status: DC

## 2019-07-25 MED ORDER — SUMATRIPTAN SUCCINATE 100 MG PO TABS: ORAL_TABLET | ORAL | 11 refills | Status: DC

## 2019-07-25 MED ORDER — GABAPENTIN 300 MG PO CAPS: ORAL_CAPSULE | ORAL | 11 refills | Status: DC

## 2019-07-25 MED ORDER — AJOVY 225 MG/1.5ML ~~LOC~~ SOSY: 1.0000 "pen " | PREFILLED_SYRINGE | SUBCUTANEOUS | 12 refills | Status: DC

## 2019-07-25 MED ORDER — TROKENDI XR 50 MG PO CP24: 50.0000 mg | ORAL_CAPSULE | Freq: Every day | ORAL | 11 refills | Status: DC

## 2019-07-25 MED ORDER — ONDANSETRON 4 MG PO TBDP: 4.0000 mg | ORAL_TABLET | Freq: Three times a day (TID) | ORAL | 11 refills | Status: DC | PRN

## 2019-07-25 NOTE — Progress Notes (Signed)
[facility]    Provider:  Dr Lucia GaskinsAhern Referring Provider: Merri BrunetteSmith, Candace, MD Primary Care Physician:  Merri BrunetteSmith, Candace, MD   CC: migraines  She has 3 good weeks with the Ajovy and the nuvaring, she replaces the nuvaring every 3 weeks. She can have 2 weeks without migraines. And then as she gets closer to the ajovy and nuvaring she starts getting worse. She uses 6 pills a month for her migrianes rarely does she have to take a second dose, 6 migraine days a month and otherwise no headaches and the acute management works. She is happy, want to continue as is.   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 M-F 9-2. They are home schooling her kids in the last year, her husband is a Education officer, environmentalpastor.   Interval history: Migraines/headaches about 50% better. 6 headache days a month and not as bad, her acute management helps. She has not had to fill imitrex monthly maybe every few months. New problem, difficulty maintaining sleep, discussed sleep hygiene.  Interval history; Migraines worsened in the fall. Increased to 15 headache days a month for over 6 months, increased Trokendi to 150mg  and didn't help. January has been better but in the last month she feels improved, still 15 headache days and still having 10 migraine days. They will start with auras. Imitrex helps.  She wants to try the Ajovy. She is post-menopausal which may be affecting her.   Interval history 01/03/2017: Her headaches are improved and she is doing wll on the Trokendi. She is on Nuvaring and she takes it out every 3 weeks and has a cycle then replaces it. Another option is to replace the Nuvaring every 3 weeks. She gets 3-4 migraines the week of her menses which is why replacing the Nuvaring every 3 weeks may help. No side effects to the Trokendi. These migraines are not as debilitating and the acute management helps. Husband is here and is very happy as  well.   Interval history 06/22/2016:  Very nice 38 year old patient here for follow-up of migraines. Last seen almost a year ago, verapamil did not help her headaches, however the combination of Imitrex, and antiemetic and anti-inflammatory medication works great for acute management of migraines. She was recently diagnosed with ovarian failure. The migraines have changed since managing her ovarian failure, She gets migraines the week of her menses every day for a week and then a few other migraines the other weeks. Possibly 9-10 migraines a month. Verapamil didn't help. Husband had a vasectomy and she diagnosed with POI so no more kids. She tried Topamax in the past and it made her very tired and she would sleeps through hearing her children in the middle of the night. She is willing to try Topamax again. We discussed all the other options including other classes of medications however Topamax is a great migraine preventative and she is willing to try again we can try the extended release version which has improved side effects.Discussed other classes of medications and side effects, she decide to try Topamax.  Tried: Topamax. Did not tolerate Topamax IR. Failed verapamil. Gabapentin. Amitriptyline.    HPI: 38 year old female with Migrains. PMHx hypothyroidism due to Hashimoto's Thyroiditis, fatigue and tremor. She has had migraines since the age of 38. It was a cleaning product that caused it, resolved with changes in environmental factors and since then they come and go. They go in cycles,  has them for a while then go away. Worsening the last 3 years. 3 migraines a week now. She tried Topamax and could not tolerate. Took fioricet in the past for acute management. Pain is on the right side of the head. Dizziness. Light headedness. Weakness in the arms after the headaches or during the headaches. Maybe numbness, like they are falling asleep. Left > right side affected. She had a head tremor for a few  months that resolved. She had vertigo with her headaches a few weeks ago. She has had 3 occular migraines in her lifetime. The headaches last all day if she wakes up with it. If headache starts is in the afternoon, it may last 6 hours if she sleeps. Throbbing, light and sound sensitivity, nausea but rare vomiting.Dark room staying still helps. 7-8/10 at its worse. No auras. She feels mentally foggy with the migraines. No insomnia. No other focal neurologic deficits. Aunt with Parkinsons Dz.   Reviewed notes, labs and imaging from outside physicians, which showed: CMP 07/2015 unremarkable Creatinine 0.71. CBC nml, TSH nml, LDL 74,   Personally reviewed MRI brain:  Ventricle size normal. Cerebral volume normal. Low lying cerebellar tonsils approximately 5 mm without Chiari malformation. Pituitary normal in size. Negative for acute or chronic infarction Negative for demyelinating disease. Cerebral white matter is normal. Brainstem and basal ganglia are normal. Negative for hemorrhage or mass lesion. Normal enhancement following contrast infusion. Paranasal sinuses are clear. IMPRESSION: Normal   Review of Systems: Patient complains of symptoms per HPI as well as the following symptoms: Fatigue, light sensitivity, cold sensitivity, nausea, food allergies, headache, weakness, daytime sleepiness. Pertinent negatives per HPI. All others negative.   Social History   Socioeconomic History  . Marital status: Married    Spouse name: [name]  . Number of children: 2  . Years of education: 85  . Highest education level: Not on file  Occupational History  . 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 Financial resource strain: Not on file  . Food insecurity    Worry: Not on file    Inability: Not on file  . Transportation needs    Medical: Not on file    Non-medical: Not on file  Tobacco Use  . Smoking status: Never Smoker  . Smokeless tobacco: Never Used  Substance and Sexual  Activity  . Alcohol use: No  . Drug use: No  . Sexual activity: Not on file    Comment: nuvaring for hormone replacement  Lifestyle  . Physical activity    Days per week: Not on file    Minutes per session: Not on file  . Stress: Not on file  Relationships  . Social Herbalist on phone: Not on file    Gets together: Not on file    Attends religious service: Not on file    Active member of club or organization: Not on file    Attends meetings of clubs or organizations: Not on file    Relationship status: Not on file  . Intimate partner violence    Fear of current or ex partner: Not on file    Emotionally abused: Not on file    Physically abused: Not on file    Forced sexual activity: Not on file  Other Topics Concern  . Not on file  Social History Narrative   Lives at home with husband [name] and 2 children.    Caffeine use: 1 cup of coffee per day  Right handed    Family History  Problem Relation Age of Onset  . Stroke Mother   . Diabetes Son   . Hypothyroidism Son   . Stroke Maternal Grandmother   . Heart disease Maternal Grandfather   . Hashimoto's thyroiditis Daughter        Hashimoto's 2  . Migraines Neg Hx     Past Medical History:  Diagnosis Date  . Eczema   . Fatigue   . Hypothyroidism, acquired, autoimmune   . Migraines   . Thyroiditis, autoimmune    Hashimotos     Past Surgical History:  Procedure Laterality Date  . MOLE REMOVAL  2010  . tongue growth  2010  . WISDOM TOOTH EXTRACTION  1999    Current Outpatient Medications  Medication Sig Dispense Refill  . Calcium-Magnesium-Vitamin D (CITRACAL CALCIUM+D) 600-40-500 MG-MG-UNIT TB24 Take 1 tablet at dinner and 1 tablet at bedtime. 90 tablet 1  . etonogestrel-ethinyl estradiol (NUVARING) 0.12-0.015 MG/24HR vaginal ring Place 1 each vaginally every 28 (twenty-eight) days. Insert vaginally and leave in place for 28 days then replace.    . Fremanezumab-vfrm (AJOVY) 225 MG/1.5ML SOSY Inject  1 pen into the skin every 30 (thirty) days. 1.5 mL 12  . gabapentin (NEURONTIN) 300 MG capsule Take 1-2 an hour before bedtime 60 capsule 11  . levothyroxine (SYNTHROID) 112 MCG tablet Take 2 tablets 6 days a week and 1 tablet 1 day a week (Patient taking differently: Take 2 tablets 4 days a week and 1 tablet 3 days a week) 160 tablet 1  . naproxen (NAPROSYN) 500 MG tablet Take 500 mg by mouth as needed.     . ondansetron (ZOFRAN-ODT) 4 MG disintegrating tablet Take 1 tablet (4 mg total) by mouth every 8 (eight) hours as needed for nausea or vomiting. 30 tablet 11  . SUMAtriptan (IMITREX) 100 MG tablet TAKE 1 TABLET(100 MG) BY MOUTH 1 TIME. MAY REPEAT IN 2 HOURS IF HEADACHE PERSISTS OR RECURS 10 tablet 11  . Topiramate ER (TROKENDI XR) 100 MG CP24 Take 100 mg by mouth at bedtime. Take with Trokendi XR 50 mg for total dose of 150 mg daily at bedtime 30 capsule 11  . Topiramate ER (TROKENDI XR) 50 MG CP24 Take 50 mg by mouth at bedtime. Take with Trokendi 100 mg capsule for total dose of 150 mg daily at bedtime 30 capsule 11  . cetirizine (ZYRTEC) 10 MG tablet Take 10 mg by mouth daily as needed.      No current facility-administered medications for this visit.     Allergies as of 07/25/2019 - Review Complete 07/25/2019  Allergen Reaction Noted  . Codeine  04/07/2011    Vitals: BP 114/72 (BP Location: Left Arm, Patient Position: Sitting)   Pulse 75   Temp 99.2 F (37.3 C) Comment: pt reports she was in a warm car and has a sweater on  Ht 5' 7.5" (1.715 m)   Wt 188 lb (85.3 kg)   LMP 01/15/2015 (Exact Date)   BMI 29.01 kg/m  Last Weight:  Wt Readings from Last 1 Encounters:  07/25/19 188 lb (85.3 kg)   Last Height:   Ht Readings from Last 1 Encounters:  07/25/19 5' 7.5" (1.715 m)     Speech:  Speech is normal; fluent and spontaneous with normal comprehension.  Cognition:  The patient is oriented to person, place, and time;   recent and remote memory intact;    language fluent;   normal attention, concentration,   fund  of knowledge Cranial Nerves:  The pupils are equal, round, and reactive to light. The fundi are normal and spontaneous venous pulsations are present. Visual fields are full to finger confrontation. Extraocular movements are intact. Trigeminal sensation is intact and the muscles of mastication are normal. The face is symmetric. The palate elevates in the midline. Hearing intact. Voice is normal. Shoulder shrug is normal. The tongue has normal motion without fasciculations.   Coordination:  Normal finger to nose and heel to shin. Normal rapid alternating movements.   Gait:  Heel-toe and tandem gait are normal.   Motor Observation:  No asymmetry, no atrophy, and no involuntary movements noted. Tone:  Normal muscle tone.   Posture:  Posture is normal. normal erect   Strength:  Strength is V/V in the upper and lower limbs.    Sensation: intact to LT   Reflex Exam:  DTR's:  Deep tendon reflexes in the upper and lower extremities are normal bilaterally.  Toes:  The toes are downgoing bilaterally.  Clonus:  Clonus is absent.    Assessment/Plan: Patient with chronic migraines with aura, not intractable, without status. She has some basilar symptoms, she has had retinal migraines. Continue Ajovy and Topamax.  Migraines and Night time awakenings: gabapentin 1-2 tabs prior to bed   As far as your migraine prevention medications are concerned, I would like to suggest: Continue Trokend. Continue Ajovy. Cont gabapentin  - For her menstrual migraines, an option is to replace the Nuvaring every 3 weeks and not allow a week without Nuvaring since migraines are during week of period. She is doing well on this.   Will continue Imitrex with alleve and zofran for acute management. Did discuss at length that this medication is contraindicated in patients with hemiplegic or basilar type  migraine due to risk of stroke or other vascular complications. However this is controversial and some studies do not should increased risk. Offered other acute management options. Patient acknowledges risks. If she has any side effects, would like her to stop and call us. Provided UpToDate patient drug information handout. Discussed teratogenicity.   Discussed:  "There is increased risk for stroke in women with migraine with aura and a contraindication for the combined contraceptive pill for use by women who have migraine with aura. The risk for women with migraine without aura is lower. However other risk factors like smoking are far more likely to increase stroke risk than migraine. There is a recommendation for no smoking and for the use of OCPs without estrogen such as progestogen only pills particularly for women with migraine with aura.[name]. People who have migraine headaches with auras may be 3 times more likely to have a stroke caused by a blood clot, compared to migraine patients who don't see auras. Women who take hormone-replacement therapy may be 30 percent more likely to suffer a clot-based stroke than women not taking medication containing estrogen. Other risk factors like smoking and high blood pressure may be  much more important."  Naomie DeanAntonia Caliber Landess, MD  [facility] [address] [place], [place] [id number]  Phone [phone] Fax [phone]  A total of 25 minutes was spent face-to-face with this patient. Over half this time was spent on counseling patient on the  1. Migraine without aura and without status migrainosus, not intractable    diagnosis and different diagnostic and therapeutic options available.

## 2019-07-26 ENCOUNTER — Telehealth: Payer: Self-pay | Admitting: *Deleted

## 2019-07-26 NOTE — Telephone Encounter (Signed)
Completed Trokendi XR 50 mg PA on CMM. Tried and failed Topiramate IR and Amitriptyline. Key: [id number]. Approved immediately. Effective from 07/26/2019 through 07/24/2022.

## 2019-07-26 NOTE — Telephone Encounter (Signed)
Completed Ajovy PA on CMM. KEY: [id number].   Awaiting [employer] determination. Per CMM: "If [employer] has not responded in 3 business days or if you have any questions about your submission, contact [facility] at [phone]."

## 2019-07-30 ENCOUNTER — Encounter: Payer: Self-pay | Admitting: *Deleted

## 2019-07-30 NOTE — Telephone Encounter (Signed)
We received an Ajovy denial from [employer]. Ajovy denied d/t pt not meeting requirements of trial of Emgality and Aimovig first. Pt should be able to continue using the savings card for $5/month copay. If we should choose to appeal, fax to [facility] Department Level 1 @ [phone].   mychart message sent to pt.

## 2019-07-31 DIAGNOSIS — F4323 Adjustment disorder with mixed anxiety and depressed mood: Secondary | ICD-10-CM | POA: Diagnosis not present

## 2019-08-02 DIAGNOSIS — H5213 Myopia, bilateral: Secondary | ICD-10-CM | POA: Diagnosis not present

## 2019-08-05 ENCOUNTER — Other Ambulatory Visit: Payer: Self-pay | Admitting: Neurology

## 2019-08-07 ENCOUNTER — Other Ambulatory Visit: Payer: Self-pay | Admitting: Neurology

## 2019-08-07 DIAGNOSIS — F4323 Adjustment disorder with mixed anxiety and depressed mood: Secondary | ICD-10-CM | POA: Diagnosis not present

## 2019-08-14 DIAGNOSIS — R5383 Other fatigue: Secondary | ICD-10-CM | POA: Diagnosis not present

## 2019-08-14 DIAGNOSIS — E559 Vitamin D deficiency, unspecified: Secondary | ICD-10-CM | POA: Diagnosis not present

## 2019-08-14 DIAGNOSIS — E063 Autoimmune thyroiditis: Secondary | ICD-10-CM | POA: Diagnosis not present

## 2019-08-15 LAB — COMPREHENSIVE METABOLIC PANEL
AG Ratio: 1.5 (calc) (ref 1.0–2.5)
ALT: 14 U/L (ref 6–29)
AST: 13 U/L (ref 10–30)
Albumin: 4.3 g/dL (ref 3.6–5.1)
Alkaline phosphatase (APISO): 31 U/L (ref 31–125)
BUN: 22 mg/dL (ref 7–25)
CO2: 24 mmol/L (ref 20–32)
Calcium: 9.3 mg/dL (ref 8.6–10.2)
Chloride: 107 mmol/L (ref 98–110)
Creat: 0.9 mg/dL (ref 0.50–1.10)
Globulin: 2.9 g/dL (calc) (ref 1.9–3.7)
Glucose, Bld: 102 mg/dL (ref 65–139)
Potassium: 4.1 mmol/L (ref 3.5–5.3)
Sodium: 140 mmol/L (ref 135–146)
Total Bilirubin: 0.3 mg/dL (ref 0.2–1.2)
Total Protein: 7.2 g/dL (ref 6.1–8.1)

## 2019-08-15 LAB — TSH: TSH: 3.77 mIU/L

## 2019-08-15 LAB — PTH, INTACT AND CALCIUM
Calcium: 9.3 mg/dL (ref 8.6–10.2)
PTH: 20 pg/mL (ref 14–64)

## 2019-08-15 LAB — VITAMIN D 25 HYDROXY (VIT D DEFICIENCY, FRACTURES): Vit D, 25-Hydroxy: 29 ng/mL — ABNORMAL LOW (ref 30–100)

## 2019-08-15 LAB — T4, FREE: Free T4: 1.3 ng/dL (ref 0.8–1.8)

## 2019-08-15 LAB — T3, FREE: T3, Free: 2.4 pg/mL (ref 2.3–4.2)

## 2019-08-22 ENCOUNTER — Encounter (INDEPENDENT_AMBULATORY_CARE_PROVIDER_SITE_OTHER): Payer: Self-pay | Admitting: "Endocrinology

## 2019-08-22 ENCOUNTER — Other Ambulatory Visit: Payer: Self-pay

## 2019-08-22 ENCOUNTER — Ambulatory Visit (INDEPENDENT_AMBULATORY_CARE_PROVIDER_SITE_OTHER): Payer: BC Managed Care – PPO | Admitting: "Endocrinology

## 2019-08-22 VITALS — BP 128/82 | HR 72 | Ht 67.32 in | Wt 186.4 lb

## 2019-08-22 DIAGNOSIS — Z23 Encounter for immunization: Secondary | ICD-10-CM

## 2019-08-22 DIAGNOSIS — E063 Autoimmune thyroiditis: Secondary | ICD-10-CM | POA: Diagnosis not present

## 2019-08-22 DIAGNOSIS — E559 Vitamin D deficiency, unspecified: Secondary | ICD-10-CM | POA: Diagnosis not present

## 2019-08-22 DIAGNOSIS — G479 Sleep disorder, unspecified: Secondary | ICD-10-CM

## 2019-08-22 DIAGNOSIS — E049 Nontoxic goiter, unspecified: Secondary | ICD-10-CM

## 2019-08-22 DIAGNOSIS — R5383 Other fatigue: Secondary | ICD-10-CM

## 2019-08-22 NOTE — Patient Instructions (Signed)
Follow up visit in 6 months. Please repeat lab tests in 3 months and 6 months.  °

## 2019-08-22 NOTE — Progress Notes (Addendum)
CC: FU of hypothyroidism, secondary to Hashimoto's thyroiditis, goiter, pallor, and fatigue.  HPI: [name] is a 38 y.o. Caucasian woman. She was unaccompanied.  1. [name] had her initial adult endocrine consultation with me on 02/03/11:  A. She was diagnosed with hypothyroidism due to Hashimoto's Thyroiditis in about September 2009, 6-7 months after the birth of her second child. She was started on levothyroxine.   B. When I saw her for that initial visit, she felt better on her Synthroid dose of 50 mcg/day on even-numbered days and 75 mcg/day on odd-numbered days, but was still cold. She had a 20+ gram gland. The remainder of her exam was normal  2. During the past eight years we have dealt with several issues:  A. We have gradually increased her dose of Synthroid in order to attain a TSH goal of 1.0-2.0. Her symptoms of coldness and fatigue have improved, but not totally resolved.   B. Her weight has fluctuated during this time.   C. Her last regular menstrual period was on July 20th 2016. 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   3. Her last clinic visit was on 02/14/19. At that visit I changed her Synthroid dosage to two of the 112 mcg tablets on 6 days per week, but only one 112 mcg tablet /day on the seventh days. However, after reviewing her lab results in June, I changed the dosage again to two of the 112 mcg tablets per day for 4 days each week, but only one tablet per day on three days per week.  A. In the interim she has been healthy.    1). She has been tired "all the time". She works part-time at about 20 hours per week. She is also a full-time wife and mother.  [name]` 2). Nancy [name]'s migraines have improved but vary with the barometric pressure. She still takes Ajovy (an antibody) injections, now about once a month.. She was [facility] Lucia [name] [facility] again recently.        3). She is not having many leg cramps and  muscle spasms, but they still occur occasionally.     4). She has not been sleeping well. She does not have insomnia, but she awakens 1-2 times each night even when she takes the gabapentin. Now that her son, Nancy [name], who has T1DM, has a CGM to monitor his BG values during the night, she no longer gets up to check on him. She has only one caffeinated drink per day.   B. She remains on Synthroid, 224 mcg/day for 4 days each week and 112 mcg/day for three days each week. . She still takes Citracal-D twice daily, Gabapentin, and Topiramate at bedtime.   C. She walks for one mile a day before work and sometimes a second mile in the evening if the weather is not too hot.        4. Pertinent Review of Systems: Constitutional. The patient feels "better in the first half of the day. ".   Energy: Energy level is good in the mornings, but decreases in the afternoons.    Body temperature: The patient is "cold natured".  Weight: She has gained 2 pounds.   Eyes: The patient's vision is good. There are no problems with soreness, bulging, or limited range of eye movements. She had an eye exam about two weeks ago.  Neck: The patient is not aware of any problems relating to the anterior neck  and thyroid bed. There have been no significant problems with swelling, pain, soreness, tenderness, pressure, discomfort, or difficulty swallowing. Heart: The patient feels the expected increase in heart rate during exercise or other physical activities. There have been no significant problems with palpitations, irregular heart beats, chest pain, or chest pressure. Gastrointestinal: She thinks that she has dairy intolerance. Bowel movements are normal. There are no significant complaints of excessive hunger, acid reflux, upset stomach, stomach aches or pains, diarrhea, or constipation. Musculoskeletal: She still occasionally has calf cramps in the evenings. There are no significant problems with hand tremor, sweaty palms, palmar  erythema, or lower leg swelling. Psychological: She is doing "good".   Mental: Her brain fog varies with the amount of good sleep that she gets. She has no problems with her abilities to pay attention, to remember, to think, and to make decisions in the mornings, but does not do as well when she fatigues later in the day.       GYN: As above.   PAST MEDICAL, FAMILY, AND SOCIAL HISTORY: 1. 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 The family still lives in [place], [place], where her husband, Nancy [name], is a Company secretary. Her sister was also diagnosed with hypothyroidism secondary to Hashimoto's thyroiditis. She has a grand aunt who had Parkinson's Dz.  2. Activities: She has not been exercising as much in the hot weather.    3. Tobacco, alcohol, and drugs: None 4. PCP: Dr. Carol [name] 5. Neurology: Dr. Sarina [name], [facility]  REVIEW OF SYSTEMS: [name] has no other significant issues involving her other body systems.  PHYSICAL EXAM:  BP 128/82   Pulse 72   Ht 5' 7.32" (1.71 m)   Wt 186 lb 6.4 oz (84.6 kg)   LMP 01/15/2015 (Exact Date)   BMI 28.91 kg/m    Wt Readings from Last 3 Encounters:  08/22/19 186 lb 6.4 oz (84.6 kg)  07/25/19 188 lb (85.3 kg)  02/14/19 184 lb 3.2 oz (83.6 kg)    Ht Readings from Last 3 Encounters:  08/22/19 5' 7.32" (1.71 m)  07/25/19 5' 7.5" (1.715 m)  02/14/19 5' 7.32" (1.71 m)   Body mass index is 28.91 kg/m.  Body surface area is 2 meters squared.  Constitutional: The patient looks healthy, but somewhat heavier today. She has gained 2 pounds. She is bright and alert. Her affect and insight are normal.   Head: I do not detect any head tremor today.    Eyes: There is no arcus or proptosis.  Mouth: The oropharynx appears normal. The tongue appears normal. There is normal oral moisture. There is no obvious gingivitis. There is no hyperpigmentation.  Neck: There are no bruits  present. The thyroid gland appears normal on inspection. The thyroid gland is slightly more enlarged at about 21 grams. The left lobe is mildly enlarged. The right lobe is minimally enlarged. The consistency of the thyroid gland is normal. There is no thyroid tenderness to palpation. Lungs: The lungs are clear. Air movement is good. Heart: The heart rhythm and rate appear normal. Heart sounds S1 and S2 are normal. I do not appreciate any pathologic heart murmurs. Abdomen: The abdomen is normal in size today. Bowel sounds are normal. The abdomen is soft and non-tender. There is no obviously palpable hepatomegaly, splenomegaly, or other masses.  Arms: Muscle mass appears appropriate for age.  Hands: She has a trace  tremor today. Phalangeal and metacarpophalangeal joints appear normal. Palms are warm. She has no pallor of the fingers when she flexes her fingers.The nails are slightly dystrophic, but improved. She does not have any hyperpigmentation of the palms.   Legs: Muscle mass appears appropriate for age. There is no edema.  Neurologic: Muscle strength is normal for age and gender in both the upper and the lower extremities. Muscle tone appears normal. Sensation to touch is normal in the legs.  LAB DATA:   Labs 08/14/19: TSH 3.77, free 4 1.4, free T3 2.4; CMP normal, with calcium 9.3; PTH 20 (ref 14-64); 25-OH vitamin D 29  Labs 05/21/19: TSH 0.17, free T4 1.4, free T3 2.9  Labs 02/06/19: TSH 0.14, free T4 1.7, free T3 3.2; PTH 15, calcium 9.1, 25-OH vitamin D 34  Labs 11/29/18: TSH 4.82, free T4 1.1, free T3 2.3  Labs 09/26/18: TSH 5.37, free T4 1.0, free T3 2.2  Labs 06/09/18: TSH 2.97, free T4 1.4, free T3 2.6; PTH 11, calcium 9.7, 25-OH vitamin 38  Labs 04/07/18: TSH 1.95, free T4 1.4, free T3 2.6  Labs 12/08/17: TSH 1.53, free T4 1.4, free T3 2.5  Labs 06/02/17: TSH 1.49, free T4 1.5, free T3 2.6; 1,25-OH vitamin D 70 (ref 18-72), 25-OH vitamin D 29, magnesium 2.1 (ref 1.5-2.5),  phosphorus 3.0 (ref 2.5-4.5), PTH 25 (ref 14-64), calcium 9.3 (8.6-10.2); CMP normal   Labs 02/16/17: TSH 2.62, free T4 1.4, free T3 2.9  11/16/16: TSH 2.85, free T4 1.2, free T3 2.6  08/17/16: TSH 2.234, free T4 1.4, free T3 2.4  04/14/16: TSH 2.90, free T4 1.4, free T3 2.6  10/08/15: TSH 1.098, free T4 1.10, free T3 3.0; CBC normal, iron 97  08/20/15: TSH 1.513, free T4 1.34, free T3 2.8  04/09/15: TSH 2.563, free T4 1.29, free T3 2.6; WBC 4.3, Hgb 13.3, Hct 39.9%; iron 95  10/07/14: TSH 2.478, free T4 1.39, free T3 2.8  04/03/14: TSH 4.281, free T4 1.27, free T3 3.0  10/04/13: TSH 2.393, free T4 1.34, free T3 2.9  03/16/13: TSH 2.862, free T4 1.14, free T3 2.7  01/22/13: TSH 4.261, free T4 1.14, free T3 2.9  06/21/12: TSH 2.427, free T4 1.11, free T3 3.1  03/20/12: TSH 2.980, free T4 1.14, free T3 3.1  ASSESSMENT: 1. Hypothyroid:   A. Patient is clinically euthyroid/hypothyroid, but is chemically hypothyroid if the physiologic range of 0.5-3.4 is used for the TSH value.   B. We need to increase her LT4 dose by one 112 mcg pill per week.   C. It appears that Baxter HireKristen is one of those people who has a very narrow therapeutic window for Synthroid, so we really needs to keep her TSH between 1.0-2.0, which is the ideal goal range for TSH for patients who are on Synthroid replacement.   D. It also appears that Baxter HireKristen is continuing to slowly lose thyrocytes over time, but not necessarily in a steadily progressive pattern.   E. We will continue to check her TFTs every 3 months and see her every 6 months in follow up.  2. Thyroiditis: Her thyroiditis is clinically quiescent today, but intermittently active. 3. Goiter: Her goiter is a bit more enlarged today. The process of waxing and waning of thyroid gland size over time is c/w evolving Hashimoto's disease.  4. Pallor: Resolved. 5-6. Fatigue/poor sleep: This problem improved greatly from December to January and February as her thyroxine level  increased. I suspect that her early awakening is a  major factor in her fatigue. I suggested that she discuss this issue with Dr. Lucia Gaskins. She might benefit from trazodone if that medication would not adversely interact with her other medications that act on the CNS.  7. Head tremor: I do not see any evidence of head tremor today. 8. Tremor: I do not see any hand tremor today.    9. 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 With the addition of Topiramate XR and Ajovy, the frequency and severity of her migraines have markedly improved.    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 This problem has become increasingly more common in the population, paralleling the increasing incidence of autoimmune thyroid disease and other autoimmune diseases in the population at large.  11. Muscle cramps and spasms:   A. The cramps and spasms have been occurring less frequently, but still bother her at times.   B. Given the autoimmune nature of her hypothyroidism and POF, it was quite possible that she might be developing autoimmune hypoparathyroidism. Fortunately, in June 2018 her PTH, calcium, magnesium, phosphorus, and calcitriol levels were normal. Her vitamin D level was a bit low. Since then the vitamin D level has improved and the calcium and PTH are still good as of June 2019 and February 2020.  12. Vitamin D deficiency: Because she has been reducing her dairy intake, and because her intestine absorbs vitamin D less well over time, she needs exogenous vitamin D. Despite taking Citracal-D twice daily her vitamin D level is still a bit low. I suggested that she take one Citracal-D at lunch, at dinner, and at bedtime.   Plan:  1. Diagnostic: TFTs in 3 months. TFTs, calcium, PTH, and vitamin D in 6 months. I put in the orders for the 75-month and 62-month tests.  2. Therapeutic: Change levothyroxine to two of the 112 mcg pills per day for 5 days each week,  but take only one pill per day on two days per week.. Continue one Citracal, 600/400 tablet three times daily.  3. Patient education: We discussed the fact that she is gradually losing thyroid cells over time. Therefore we will need to check her TFTs every 3-6 months and increase the dose of Synthroid as needed. We also discussed the need to draw labs to assess her PTH, calcium, and vitamin d status. be developing autoimmune hypoparathyroidism. 4. Follow-up: FU in six months.   Level of Service: This visit lasted in excess of 60 minutes. More than 50% of the visit was devoted to counseling.  Molli Knock, MD, CDE Adult and Pediatric Endocrinology

## 2019-08-23 DIAGNOSIS — Z0000 Encounter for general adult medical examination without abnormal findings: Secondary | ICD-10-CM | POA: Diagnosis not present

## 2019-08-23 DIAGNOSIS — Z13220 Encounter for screening for lipoid disorders: Secondary | ICD-10-CM | POA: Diagnosis not present

## 2019-08-23 DIAGNOSIS — E039 Hypothyroidism, unspecified: Secondary | ICD-10-CM | POA: Diagnosis not present

## 2019-08-23 DIAGNOSIS — G43909 Migraine, unspecified, not intractable, without status migrainosus: Secondary | ICD-10-CM | POA: Diagnosis not present

## 2019-08-28 DIAGNOSIS — F4323 Adjustment disorder with mixed anxiety and depressed mood: Secondary | ICD-10-CM | POA: Diagnosis not present

## 2019-08-30 ENCOUNTER — Other Ambulatory Visit: Payer: Self-pay | Admitting: Neurology

## 2019-09-04 DIAGNOSIS — F4323 Adjustment disorder with mixed anxiety and depressed mood: Secondary | ICD-10-CM | POA: Diagnosis not present

## 2019-09-11 DIAGNOSIS — F4323 Adjustment disorder with mixed anxiety and depressed mood: Secondary | ICD-10-CM | POA: Diagnosis not present

## 2019-09-17 DIAGNOSIS — F4323 Adjustment disorder with mixed anxiety and depressed mood: Secondary | ICD-10-CM | POA: Diagnosis not present

## 2019-10-01 DIAGNOSIS — Z01419 Encounter for gynecological examination (general) (routine) without abnormal findings: Secondary | ICD-10-CM | POA: Diagnosis not present

## 2019-10-09 DIAGNOSIS — F4323 Adjustment disorder with mixed anxiety and depressed mood: Secondary | ICD-10-CM | POA: Diagnosis not present

## 2019-10-16 DIAGNOSIS — F411 Generalized anxiety disorder: Secondary | ICD-10-CM | POA: Diagnosis not present

## 2019-10-23 DIAGNOSIS — F411 Generalized anxiety disorder: Secondary | ICD-10-CM | POA: Diagnosis not present

## 2019-10-25 ENCOUNTER — Telehealth: Payer: Self-pay

## 2019-10-25 NOTE — Telephone Encounter (Signed)
PA done  for Ajovy on cover my meds:  Your information has been submitted to [place]. [employer] [place] will review the request and fax you a determination directly, typically within 3 business days of your submission once all necessary information is received. If [employer] [place] has not responded in 3 business days or if you have any questions about your submission, contact [place] at [phone].

## 2019-10-30 DIAGNOSIS — F411 Generalized anxiety disorder: Secondary | ICD-10-CM | POA: Diagnosis not present

## 2019-10-30 NOTE — Telephone Encounter (Signed)
Receive fax from [facility] is denied.The medication is not on the formulary and is covered when Aimvoig and Emgality 120mg  have been tried and did not work or both were not tolerated. The Aimovig and Emgality have not been tried. Number for [employer] is [phone].

## 2019-11-06 DIAGNOSIS — F411 Generalized anxiety disorder: Secondary | ICD-10-CM | POA: Diagnosis not present

## 2019-11-13 DIAGNOSIS — F411 Generalized anxiety disorder: Secondary | ICD-10-CM | POA: Diagnosis not present

## 2019-11-19 DIAGNOSIS — F411 Generalized anxiety disorder: Secondary | ICD-10-CM | POA: Diagnosis not present

## 2019-11-20 DIAGNOSIS — F411 Generalized anxiety disorder: Secondary | ICD-10-CM | POA: Diagnosis not present

## 2019-11-27 DIAGNOSIS — F411 Generalized anxiety disorder: Secondary | ICD-10-CM | POA: Diagnosis not present

## 2019-12-10 DIAGNOSIS — E063 Autoimmune thyroiditis: Secondary | ICD-10-CM | POA: Diagnosis not present

## 2019-12-10 LAB — TSH: TSH: 2.17 mIU/L

## 2019-12-10 LAB — T3, FREE: T3, Free: 2.8 pg/mL (ref 2.3–4.2)

## 2019-12-10 LAB — T4, FREE: Free T4: 1.2 ng/dL (ref 0.8–1.8)

## 2019-12-11 DIAGNOSIS — F411 Generalized anxiety disorder: Secondary | ICD-10-CM | POA: Diagnosis not present

## 2019-12-17 ENCOUNTER — Other Ambulatory Visit: Payer: Self-pay | Admitting: Neurology

## 2019-12-17 ENCOUNTER — Encounter (INDEPENDENT_AMBULATORY_CARE_PROVIDER_SITE_OTHER): Payer: Self-pay | Admitting: *Deleted

## 2019-12-17 MED ORDER — TRAZODONE HCL 50 MG PO TABS: 50.0000 mg | ORAL_TABLET | Freq: Every evening | ORAL | 6 refills | Status: DC | PRN

## 2019-12-25 DIAGNOSIS — F411 Generalized anxiety disorder: Secondary | ICD-10-CM | POA: Diagnosis not present

## 2020-01-01 DIAGNOSIS — F411 Generalized anxiety disorder: Secondary | ICD-10-CM | POA: Diagnosis not present

## 2020-01-15 DIAGNOSIS — F411 Generalized anxiety disorder: Secondary | ICD-10-CM | POA: Diagnosis not present

## 2020-01-22 DIAGNOSIS — F411 Generalized anxiety disorder: Secondary | ICD-10-CM | POA: Diagnosis not present

## 2020-01-29 DIAGNOSIS — F411 Generalized anxiety disorder: Secondary | ICD-10-CM | POA: Diagnosis not present

## 2020-02-05 DIAGNOSIS — F411 Generalized anxiety disorder: Secondary | ICD-10-CM | POA: Diagnosis not present

## 2020-02-12 DIAGNOSIS — F411 Generalized anxiety disorder: Secondary | ICD-10-CM | POA: Diagnosis not present

## 2020-02-18 DIAGNOSIS — E559 Vitamin D deficiency, unspecified: Secondary | ICD-10-CM | POA: Diagnosis not present

## 2020-02-18 DIAGNOSIS — E063 Autoimmune thyroiditis: Secondary | ICD-10-CM | POA: Diagnosis not present

## 2020-02-19 ENCOUNTER — Ambulatory Visit (INDEPENDENT_AMBULATORY_CARE_PROVIDER_SITE_OTHER): Payer: BC Managed Care – PPO | Admitting: "Endocrinology

## 2020-02-19 ENCOUNTER — Other Ambulatory Visit: Payer: Self-pay

## 2020-02-19 ENCOUNTER — Encounter (INDEPENDENT_AMBULATORY_CARE_PROVIDER_SITE_OTHER): Payer: Self-pay | Admitting: "Endocrinology

## 2020-02-19 VITALS — BP 122/72 | Ht 67.21 in | Wt 194.2 lb

## 2020-02-19 DIAGNOSIS — E049 Nontoxic goiter, unspecified: Secondary | ICD-10-CM | POA: Diagnosis not present

## 2020-02-19 DIAGNOSIS — R252 Cramp and spasm: Secondary | ICD-10-CM

## 2020-02-19 DIAGNOSIS — R5383 Other fatigue: Secondary | ICD-10-CM

## 2020-02-19 DIAGNOSIS — H35363 Drusen (degenerative) of macula, bilateral: Secondary | ICD-10-CM | POA: Diagnosis not present

## 2020-02-19 DIAGNOSIS — E2839 Other primary ovarian failure: Secondary | ICD-10-CM

## 2020-02-19 DIAGNOSIS — E063 Autoimmune thyroiditis: Secondary | ICD-10-CM

## 2020-02-19 DIAGNOSIS — G479 Sleep disorder, unspecified: Secondary | ICD-10-CM

## 2020-02-19 DIAGNOSIS — H43393 Other vitreous opacities, bilateral: Secondary | ICD-10-CM | POA: Diagnosis not present

## 2020-02-19 LAB — VITAMIN D 25 HYDROXY (VIT D DEFICIENCY, FRACTURES): Vit D, 25-Hydroxy: 31 ng/mL (ref 30–100)

## 2020-02-19 LAB — T4, FREE: Free T4: 1.4 ng/dL (ref 0.8–1.8)

## 2020-02-19 LAB — T3, FREE: T3, Free: 2.5 pg/mL (ref 2.3–4.2)

## 2020-02-19 LAB — PTH, INTACT AND CALCIUM
Calcium: 9.4 mg/dL (ref 8.6–10.2)
PTH: 21 pg/mL (ref 14–64)

## 2020-02-19 LAB — TSH: TSH: 2.19 mIU/L

## 2020-02-19 MED ORDER — LEVOTHYROXINE SODIUM 112 MCG PO TABS: ORAL_TABLET | ORAL | 1 refills | Status: DC

## 2020-02-19 NOTE — Progress Notes (Signed)
CC: FU of hypothyroidism, secondary to Hashimoto's thyroiditis, goiter, pallor, and fatigue.  HPI: [name] is a 39 y.o. Caucasian woman. She was accompanied by her husband.  1. [name] had her initial adult endocrine consultation with me on 02/03/11:  A. She was diagnosed with hypothyroidism due to Hashimoto's Thyroiditis in about September 2009, 6-7 months after the birth of her second child. She was started on levothyroxine.   B. When I saw her for that initial visit, she felt better on her Synthroid dose of 50 mcg/day on even-numbered days and 75 mcg/day on odd-numbered days, but was still cold. She had a 20+ gram gland. The remainder of her exam was normal  2. During the past nine years we have dealt with several issues:  A. We have gradually increased her dose of Synthroid in order to attain a TSH goal of 1.0-2.0. Her symptoms of coldness and fatigue have improved, but not totally resolved.   B. Her weight has fluctuated during this time.   C. Her last regular menstrual period was on July 20th 2016. 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   3. Her last clinic visit was on 08/22/19. At that visit I changed her Synthroid dosage to two of the 112 mcg tablets on 5 days per week, but only one 112 mcg tablet /day on two days per week. I continued her Citracal/D, 600/400, three times daily.   A. In the interim she has been healthy.    1). She has still been getting tired a lot. She works part-time at 20 hours per week. She is also a full-time wife and mother.  [name] 2). Nancy [name]'s migraines still occur, but now <10 per month. She still takes Ajovy (an antibody) injections, now about once a month. She is followed by Dr. Jaynee Eagles at [facility].       3). She is still having leg cramps and muscle spasms, but not as frequently.      4). She has been sleeping better since switching from gabapentin to trazodone. She does not have insomnia, but she  awakens about once each night to urinate. With trazodone, however, she can fall back to sleep pretty well. She has only one caffeinated drink per day.   B. She remains on Synthroid, 224 mcg/day for 5 days each week and 112 mcg/day for two days each week. She still takes Citracal-D twice daily, trazodone, and Topiramate at bedtime.   C. She walks for one mile a day every morning.         4. Pertinent Review of Systems: Constitutional. The patient feels "tired, but [pretty good".   Energy: Energy level is good in the mornings, but decreases in the afternoons.    Body temperature: The patient is still very "cold natured".  Weight: She has gained 8 pounds.   Eyes: The patient's vision is good. She had a dilated eye exam earlier this afternoon. There are no problems with soreness, bulging, or limited range of eye movements.   Neck: The patient is not aware of any problems relating to the anterior neck and thyroid bed. There have been no significant problems with swelling, pain, soreness, tenderness, pressure, discomfort, or difficulty swallowing. Heart: She occasionally notes a skipped beat. The patient feels the expected increase in heart rate during exercise or other physical activities. There have been no significant problems with palpitations, irregular heart beats, chest pain, or chest pressure. Gastrointestinal: She thinks that she has  dairy intolerance. Bowel movements are normal. There are no significant complaints of excessive hunger, acid reflux, upset stomach, stomach aches or pains, diarrhea, or constipation. Musculoskeletal: She still occasionally has calf cramps in the evenings, but infrequently. There are no significant problems with hand tremor, sweaty palms, palmar erythema, or lower leg swelling. Psychological: She is doing "good".  She sees a Veterinary surgeon virtually weekly.  Mental: Her brain fog varies with the amount of good sleep that she gets. She has no problems with her abilities to  pay attention, to remember, to think, and to make decisions in the mornings, but does not do as well when she fatigues later in the day.       GYN: She has premature ovarian failure/  PAST MEDICAL, FAMILY, AND SOCIAL HISTORY: 1. 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 The family still lives in [place], [place], where her husband, Seymour Bars, is a Optician, dispensing. Her sister was also diagnosed with hypothyroidism secondary to Hashimoto's thyroiditis. She has a grand aunt who had Parkinson's Dz. [Addendum 02/19/11: Her paternal aunt was recently diagnosed with Hashimoto's disease.] 2. Activities: She has been walking daily.    3. Tobacco, alcohol, and drugs: None 4. PCP: Dr. [name] 5. Neurology: Dr. Naomie Dean, [facility]  REVIEW OF SYSTEMS: [name] has no other significant issues involving her other body systems.  PHYSICAL EXAM:  BP 122/72   Ht 5' 7.21" (1.707 m)   Wt 194 lb 3.2 oz (88.1 kg)   LMP 01/15/2015 (Exact Date)   BMI 30.23 kg/m  HR is 85.   Wt Readings from Last 3 Encounters:  02/19/20 194 lb 3.2 oz (88.1 kg)  08/22/19 186 lb 6.4 oz (84.6 kg)  07/25/19 188 lb (85.3 kg)    Ht Readings from Last 3 Encounters:  02/19/20 5' 7.21" (1.707 m)  08/22/19 5' 7.32" (1.71 m)  07/25/19 5' 7.5" (1.715 m)   Body mass index is 30.23 kg/m.  Body surface area is 2.04 meters squared.  Constitutional: The patient looks healthy, but somewhat heavier today. She has gained 8 pounds. She is bright and alert. Her affect and insight are normal.   Head: I do not detect any head tremor today.    Eyes: There is no arcus or proptosis.  Mouth: The oropharynx appears normal. The tongue appears normal. There is normal oral moisture. There is no obvious gingivitis. There is no oral  hyperpigmentation.  Neck: There are no bruits present. The thyroid gland appears normal on inspection. The thyroid gland is again slightly enlarged at  about 21 grams. The lobes are symmetrically enlarged today, a change from her last visit.   The consistency of the thyroid gland is normal. There is no thyroid tenderness to palpation. Lungs: The lungs are clear. Air movement is good. Heart: The heart rhythm and rate appear normal. Heart sounds S1 and S2 are normal. I do not appreciate any pathologic heart murmurs. Abdomen: The abdomen is normal in size today. Bowel sounds are normal. The abdomen is soft and non-tender. There is no obviously palpable hepatomegaly, splenomegaly, or other masses.  Arms: Muscle mass appears appropriate for age.  Hands: She has a trace-to-1+ tremor today. Phalangeal and metacarpophalangeal joints appear normal. Palms are warm. She has no pallor of the fingers when she flexes her fingers. She does not have any hyperpigmentation or erythema of the palms.   Legs: Muscle mass appears  appropriate for age. There is no edema.  Neurologic: Muscle strength is normal for age and gender in both the upper and the lower extremities. Muscle tone appears normal. Sensation to touch is normal in the legs and dorsa of the feet.  LAB DATA:   Labs 02/18/20: TSH 2.19, free T4 1.4, free T3 2.5; 25-OH vitamin D 31; PTH 21, calcium 9.4  Labs 12/10/19: TSH 2.17, free T4 1.2, free T3 2.8  Labs 08/14/19: TSH 3.77, free 4 1.4, free T3 2.4; CMP normal, with calcium 9.3; PTH 20 (ref 14-64); 25-OH vitamin D 29  Labs 05/21/19: TSH 0.17, free T4 1.4, free T3 2.9  Labs 02/06/19: TSH 0.14, free T4 1.7, free T3 3.2; PTH 15, calcium 9.1, 25-OH vitamin D 34  Labs 11/29/18: TSH 4.82, free T4 1.1, free T3 2.3  Labs 09/26/18: TSH 5.37, free T4 1.0, free T3 2.2  Labs 06/09/18: TSH 2.97, free T4 1.4, free T3 2.6; PTH 11, calcium 9.7, 25-OH vitamin 38  Labs 04/07/18: TSH 1.95, free T4 1.4, free T3 2.6  Labs 12/08/17: TSH 1.53, free T4 1.4, free T3 2.5  Labs 06/02/17: TSH 1.49, free T4 1.5, free T3 2.6; 1,25-OH vitamin D 70 (ref 18-72), 25-OH vitamin D 29,  magnesium 2.1 (ref 1.5-2.5), phosphorus 3.0 (ref 2.5-4.5), PTH 25 (ref 14-64), calcium 9.3 (8.6-10.2); CMP normal   Labs 02/16/17: TSH 2.62, free T4 1.4, free T3 2.9  11/16/16: TSH 2.85, free T4 1.2, free T3 2.6  08/17/16: TSH 2.234, free T4 1.4, free T3 2.4  04/14/16: TSH 2.90, free T4 1.4, free T3 2.6  10/08/15: TSH 1.098, free T4 1.10, free T3 3.0; CBC normal, iron 97  08/20/15: TSH 1.513, free T4 1.34, free T3 2.8  04/09/15: TSH 2.563, free T4 1.29, free T3 2.6; WBC 4.3, Hgb 13.3, Hct 39.9%; iron 95  10/07/14: TSH 2.478, free T4 1.39, free T3 2.8  04/03/14: TSH 4.281, free T4 1.27, free T3 3.0  10/04/13: TSH 2.393, free T4 1.34, free T3 2.9  03/16/13: TSH 2.862, free T4 1.14, free T3 2.7  01/22/13: TSH 4.261, free T4 1.14, free T3 2.9  06/21/12: TSH 2.427, free T4 1.11, free T3 3.1  03/20/12: TSH 2.980, free T4 1.14, free T3 3.1  ASSESSMENT: 1. Hypothyroidism, acquired, autoimmune:   A. The patient is clinically euthyroid, with some hypothyroid symptoms. She is also chemically euthyroid, at about the 28% of the physiologic range.    B. The fact that her TFTs are essentially unchanged, despite adding one 112 mcg Synthroid pill per week at her last visit, suggests that she has lost more thyrocytes in the past 6 months. She may benefit symptomatically from a small increase her LT4 dose by one 112 mcg pill per week.   C. It appears that Nancy [name] is one of those people who has a very narrow therapeutic window for Synthroid, so we really needs to keep her TSH between 1.0-2.0, which is the ideal goal range for TSH for patients who are on Synthroid replacement.   D. We will continue to check her TFTs every 3 months and see her every 6 months in follow up.  2. Thyroiditis: Her thyroiditis is clinically quiescent today, but intermittently active.  3. Goiter: Her goiter is still enlarged today, but the lobes have shifted in size once again. . The process of waxing and waning of thyroid gland size  over time is c/w evolving Hashimoto's disease.  4. Pallor: Resolved. 5-6. Fatigue/poor sleep:   A. Her fatigue  improved when her thyroid hormone levels were mid-euthyroid in the past and when she was sleeping better. I suspect that her early awakening has been a major factor in her fatigue.   B. Since switching to trazodone she is sleeping much better.  7. Head tremor: I do not see any evidence of head tremor today. 8. Tremor: I do not see any hand tremor today.    9. 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 With the addition of Topiramate XR and Ajovy, the frequency and severity of her migraines have markedly improved.    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 This problem has become increasingly more common in the population, paralleling the increasing incidence of autoimmune thyroid disease and other autoimmune diseases in the population at large.  11. Muscle cramps and spasms:   A. The cramps and spasms have been occurring less frequently, but still bother her at times.   B. Given the autoimmune nature of her hypothyroidism and POF, it was quite possible that she might be developing autoimmune hypoparathyroidism. Fortunately, in June 2018 her PTH, calcium, magnesium, phosphorus, and calcitriol levels were normal. Her vitamin D level was a bit low. Since then the vitamin D level has improved and the calcium and PTH were still good as of June 2019 and February 2020.  12. Vitamin D deficiency:   C. Because she has been reducing her dairy intake, and because her intestine absorbs vitamin D less well over time, she needs exogenous vitamin D. After converting to one Citracal-D at lunch, at dinner, and at bedtime, her calcium increased a bit and her PTH also increased a bit.  D. I would like to see her calcium more in the 9.6-9.8 range. I asked her to take one Tums at bedtime.    Plan:  1. Diagnostic: TFTs in 3 months. Check TFTs,  calcium, PTH, and vitamin D in 6 months. I put in the orders for the 81-month and 61-month tests.  2. Therapeutic: Change levothyroxine to two of the 112 mcg pills per day for 6 days each week, but take only one pill per day on one day per week.. Continue one Citracal, 600/400 tablet three times daily. Add one Tums at bedtime. 3. Patient education: We discussed the fact that she is still gradually losing thyroid cells over time. Therefore we will need to check her TFTs every 3-6 months and increase the dose of Synthroid as needed. We also discussed the need to draw labs to assess her PTH, calcium, and vitamin D status to identify if she develops autoimmune hypoparathyroidism. 4. Follow-up: FU in six months.   Level of Service: This visit lasted in excess of 65 minutes. More than 50% of the visit was devoted to counseling.  Molli Knock, MD, CDE Adult and Pediatric Endocrinology

## 2020-02-19 NOTE — Patient Instructions (Signed)
Follow up  visit in 6 months. Please increase the Synthroid to two pills per day for 6 days each week and one pill per day on one day each week. Please repeat thyroid tests in 3 months and both thyroid tests and calcium tests about two weeks prior to next visit.

## 2020-02-21 DIAGNOSIS — F411 Generalized anxiety disorder: Secondary | ICD-10-CM | POA: Diagnosis not present

## 2020-02-26 DIAGNOSIS — F411 Generalized anxiety disorder: Secondary | ICD-10-CM | POA: Diagnosis not present

## 2020-03-04 DIAGNOSIS — F411 Generalized anxiety disorder: Secondary | ICD-10-CM | POA: Diagnosis not present

## 2020-03-10 DIAGNOSIS — Z23 Encounter for immunization: Secondary | ICD-10-CM | POA: Diagnosis not present

## 2020-03-11 DIAGNOSIS — F411 Generalized anxiety disorder: Secondary | ICD-10-CM | POA: Diagnosis not present

## 2020-03-18 DIAGNOSIS — F411 Generalized anxiety disorder: Secondary | ICD-10-CM | POA: Diagnosis not present

## 2020-03-25 DIAGNOSIS — F411 Generalized anxiety disorder: Secondary | ICD-10-CM | POA: Diagnosis not present

## 2020-04-07 DIAGNOSIS — Z23 Encounter for immunization: Secondary | ICD-10-CM | POA: Diagnosis not present

## 2020-04-08 DIAGNOSIS — F411 Generalized anxiety disorder: Secondary | ICD-10-CM | POA: Diagnosis not present

## 2020-04-15 DIAGNOSIS — F411 Generalized anxiety disorder: Secondary | ICD-10-CM | POA: Diagnosis not present

## 2020-04-22 DIAGNOSIS — F411 Generalized anxiety disorder: Secondary | ICD-10-CM | POA: Diagnosis not present

## 2020-04-29 DIAGNOSIS — F411 Generalized anxiety disorder: Secondary | ICD-10-CM | POA: Diagnosis not present

## 2020-05-06 DIAGNOSIS — F411 Generalized anxiety disorder: Secondary | ICD-10-CM | POA: Diagnosis not present

## 2020-05-13 DIAGNOSIS — F411 Generalized anxiety disorder: Secondary | ICD-10-CM | POA: Diagnosis not present

## 2020-05-27 DIAGNOSIS — F411 Generalized anxiety disorder: Secondary | ICD-10-CM | POA: Diagnosis not present

## 2020-06-12 DIAGNOSIS — F411 Generalized anxiety disorder: Secondary | ICD-10-CM | POA: Diagnosis not present

## 2020-06-23 DIAGNOSIS — E063 Autoimmune thyroiditis: Secondary | ICD-10-CM | POA: Diagnosis not present

## 2020-06-23 LAB — TSH: TSH: 2.16 mIU/L

## 2020-06-23 LAB — T4, FREE: Free T4: 1.3 ng/dL (ref 0.8–1.8)

## 2020-06-23 LAB — T3, FREE: T3, Free: 2.6 pg/mL (ref 2.3–4.2)

## 2020-06-24 DIAGNOSIS — F411 Generalized anxiety disorder: Secondary | ICD-10-CM | POA: Diagnosis not present

## 2020-07-01 ENCOUNTER — Other Ambulatory Visit (INDEPENDENT_AMBULATORY_CARE_PROVIDER_SITE_OTHER): Payer: Self-pay | Admitting: *Deleted

## 2020-07-01 ENCOUNTER — Encounter (INDEPENDENT_AMBULATORY_CARE_PROVIDER_SITE_OTHER): Payer: Self-pay

## 2020-07-01 DIAGNOSIS — E063 Autoimmune thyroiditis: Secondary | ICD-10-CM

## 2020-07-01 MED ORDER — LEVOTHYROXINE SODIUM 112 MCG PO TABS: ORAL_TABLET | ORAL | 1 refills | Status: DC

## 2020-07-07 ENCOUNTER — Other Ambulatory Visit: Payer: Self-pay | Admitting: Neurology

## 2020-07-14 ENCOUNTER — Encounter (INDEPENDENT_AMBULATORY_CARE_PROVIDER_SITE_OTHER): Payer: Self-pay | Admitting: *Deleted

## 2020-07-17 ENCOUNTER — Other Ambulatory Visit: Payer: Self-pay | Admitting: Neurology

## 2020-07-17 NOTE — Telephone Encounter (Signed)
Called pt & LVM (ok per DPR) to clarify the dose of Trokendi XR. Asked for call or mychart message back.

## 2020-07-22 DIAGNOSIS — F411 Generalized anxiety disorder: Secondary | ICD-10-CM | POA: Diagnosis not present

## 2020-07-25 ENCOUNTER — Other Ambulatory Visit: Payer: Self-pay | Admitting: Neurology

## 2020-07-28 MED ORDER — TRAZODONE HCL 50 MG PO TABS: ORAL_TABLET | ORAL | 6 refills | Status: DC

## 2020-07-28 NOTE — Telephone Encounter (Signed)
Pt wants the Trazodone to go to [facility] in [place]. I sent the recent refills Dr Lucia Gaskins ordered to this [facility] and I checked the chart. Looks like Rx was previously sent to a [facility] in [place]. I called that [facility] and was told by the pharmacy staff that there was on RX there for Trazodone and the only Rx they had was where patient filled Ajovy on August 1st. All other prescriptions are at [facility] in [place].

## 2020-07-28 NOTE — Addendum Note (Signed)
Addended by: Bertram Savin on: 07/28/2020 03:07 PM   Modules accepted: Orders

## 2020-07-30 ENCOUNTER — Telehealth: Payer: Self-pay | Admitting: Neurology

## 2020-07-30 ENCOUNTER — Other Ambulatory Visit: Payer: Self-pay | Admitting: Neurology

## 2020-07-30 NOTE — Telephone Encounter (Signed)
Tori, do you have time tomorrow to call patient and convert to virtual appointment if she is doing well? This is a 1-year follow up. Happy to see her in the office however. thanks

## 2020-07-31 ENCOUNTER — Telehealth (INDEPENDENT_AMBULATORY_CARE_PROVIDER_SITE_OTHER): Payer: BC Managed Care – PPO | Admitting: Neurology

## 2020-07-31 ENCOUNTER — Telehealth: Payer: Self-pay | Admitting: Neurology

## 2020-07-31 DIAGNOSIS — G43711 Chronic migraine without aura, intractable, with status migrainosus: Secondary | ICD-10-CM | POA: Diagnosis not present

## 2020-07-31 MED ORDER — EMGALITY 120 MG/ML ~~LOC~~ SOAJ: 120.0000 mg | SUBCUTANEOUS | 6 refills | Status: DC

## 2020-07-31 MED ORDER — EMGALITY 120 MG/ML ~~LOC~~ SOAJ: 240.0000 mg | Freq: Once | SUBCUTANEOUS | 0 refills | Status: DC

## 2020-07-31 MED ORDER — METHYLPREDNISOLONE 4 MG PO TBPK: ORAL_TABLET | ORAL | 1 refills | Status: DC

## 2020-07-31 NOTE — Progress Notes (Signed)
[facility]    Provider:  Dr Lucia Gaskins Referring Provider: [name], MD Primary Care Physician:  [name], MD   CC: migraines  Virtual Visit via Video Note  I connected with Nancy [name] on 07/31/20 at  3:00 PM EDT by a video enabled telemedicine application and verified that I am speaking with the correct person using two identifiers.  Location: Patient: home Provider: office   I discussed the limitations of evaluation and management by telemedicine and the availability of in person appointments. The patient expressed understanding and agreed to proceed.   I discussed the assessment and treatment plan with the patient. The patient was provided an opportunity to ask questions and all were answered. The patient agreed with the plan and demonstrated an understanding of the instructions.   The patient was advised to call back or seek an in-person evaluation if the symptoms worsen or if the condition fails to improve as anticipated.  I provided 15 minutes of non-face-to-face time during this encounter.   Anson Fret, MD  Interval history July 31, 2020: Patient is here today for her migraines and last visit she was doing very well.  Unfortunately today she is with her husband and she reports her migraine frequency has increased, she has worsening vertigo with her migraines.  She is getting vertigo with the migraines.  That increasing to 2-3 times a week same quality and severity.  Her husband provides information she still on the Trokendi.  Today we discussed options we will switch her to [facility] and see her back in 4 months and give her a steroid taper.  We may need to try Botox next.   Interval history July 25, 2019: She has 3 good weeks with the Ajovy and the nuvaring, she replaces the nuvaring every 3 weeks. She can have 2 weeks without migraines. And then as she gets closer to the ajovy and nuvaring she starts getting worse. She  uses 6 pills a month for her migrianes rarely does she have to take a second dose, 6 migraine days a month and otherwise no headaches and the acute management works. She is happy, want to continue as is.   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 M-F 9-2. They are home schooling her kids in the last year, her husband is a Education officer, environmental.   Interval history: Migraines/headaches about 50% better. 6 headache days a month and not as bad, her acute management helps. She has not had to fill imitrex monthly maybe every few months. New problem, difficulty maintaining sleep, discussed sleep hygiene.  Interval history; Migraines worsened in the fall. Increased to 15 headache days a month for over 6 months, increased Trokendi to 150mg  and didn't help. January has been better but in the last month she feels improved, still 15 headache days and still having 10 migraine days. They will start with auras. Imitrex helps.  She wants to try the Ajovy. She is post-menopausal which may be affecting her.   Interval history 01/03/2017: Her headaches are improved and she is doing wll on the Trokendi. She is on Nuvaring and she takes it out every 3 weeks and has a cycle then replaces it. Another option is to replace the Nuvaring every 3 weeks. She gets 3-4 migraines the week of her menses which is why replacing the Nuvaring every 3 weeks may help. No side effects to the Trokendi. These migraines are  not as debilitating and the acute management helps. Husband is here and is very happy as well.   Interval history 06/22/2016:  Very nice 39 year old patient here for follow-up of migraines. Last seen almost a year ago, verapamil did not help her headaches, however the combination of Imitrex, and antiemetic and anti-inflammatory medication works great for acute management of migraines. She was recently diagnosed with ovarian failure. The migraines have changed since managing  her ovarian failure, She gets migraines the week of her menses every day for a week and then a few other migraines the other weeks. Possibly 9-10 migraines a month. Verapamil didn't help. Husband had a vasectomy and she diagnosed with POI so no more kids. She tried Topamax in the past and it made her very tired and she would sleeps through hearing her children in the middle of the night. She is willing to try Topamax again. We discussed all the other options including other classes of medications however Topamax is a great migraine preventative and she is willing to try again we can try the extended release version which has improved side effects.Discussed other classes of medications and side effects, she decide to try Topamax.  Tried: Topamax. Did not tolerate Topamax IR. Failed verapamil. Gabapentin. Amitriptyline.    HPI: 39 year old female with Migrains. PMHx hypothyroidism due to Hashimoto's Thyroiditis, fatigue and tremor. She has had migraines since the age of 28. It was a cleaning product that caused it, resolved with changes in environmental factors and since then they come and go. They go in cycles, has them for a while then go away. Worsening the last 3 years. 3 migraines a week now. She tried Topamax and could not tolerate. Took fioricet in the past for acute management. Pain is on the right side of the head. Dizziness. Light headedness. Weakness in the arms after the headaches or during the headaches. Maybe numbness, like they are falling asleep. Left > right side affected. She had a head tremor for a few months that resolved. She had vertigo with her headaches a few weeks ago. She has had 3 occular migraines in her lifetime. The headaches last all day if she wakes up with it. If headache starts is in the afternoon, it may last 6 hours if she sleeps. Throbbing, light and sound sensitivity, nausea but rare vomiting.Dark room staying still helps. 7-8/10 at its worse. No auras. She feels mentally  foggy with the migraines. No insomnia. No other focal neurologic deficits. Aunt with Parkinsons Dz.   Reviewed notes, labs and imaging from outside physicians, which showed: CMP 07/2015 unremarkable Creatinine 0.71. CBC nml, TSH nml, LDL 74,   Personally reviewed MRI brain:  Ventricle size normal. Cerebral volume normal. Low lying cerebellar tonsils approximately 5 mm without Chiari malformation. Pituitary normal in size. Negative for acute or chronic infarction Negative for demyelinating disease. Cerebral white matter is normal. Brainstem and basal ganglia are normal. Negative for hemorrhage or mass lesion. Normal enhancement following contrast infusion. Paranasal sinuses are clear. IMPRESSION: Normal   Review of Systems: Patient complains of symptoms per HPI as well as the following symptoms: Fatigue, light sensitivity, cold sensitivity, nausea, food allergies, headache, weakness, daytime sleepiness. Pertinent negatives per HPI. All others negative.   Social History   Socioeconomic History  . Marital status: Married    Spouse name: Seymour Bars  . Number of children: 2  . Years of education: 87  . Highest education level: Not on file  Occupational History  . 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 Smoking status: Never Smoker  . Smokeless tobacco: Never Used  Vaping Use  . Vaping Use: Never used  Substance and Sexual Activity  . Alcohol use: No  . Drug use: No  . Sexual activity: Not on file    Comment: nuvaring for hormone replacement  Other Topics Concern  . Not on file  Social History Narrative   Lives at home with husband Seymour Bars and 2 children.    Caffeine use: 1 cup of coffee per day   Right handed   Social Determinants of Health   Financial Resource Strain:   . Difficulty of Paying Living Expenses: Not on file  Food Insecurity:   . Worried About Programme researcher, broadcasting/film/video in the Last Year: Not on file  . Ran Out of Food in the Last Year: Not on file   Transportation Needs:   . Lack of Transportation (Medical): Not on file  . Lack of Transportation (Non-Medical): Not on file  Physical Activity:   . Days of Exercise per Week: Not on file  . Minutes of Exercise per Session: Not on file  Stress:   . Feeling of Stress : Not on file  Social Connections:   . Frequency of Communication with Friends and Family: Not on file  . Frequency of Social Gatherings with Friends and Family: Not on file  . Attends Religious Services: Not on file  . Active Member of Clubs or Organizations: Not on file  . Attends Banker Meetings: Not on file  . Marital Status: Not on file  Intimate Partner Violence:   . Fear of Current or Ex-Partner: Not on file  . Emotionally Abused: Not on file  . Physically Abused: Not on file  . Sexually Abused: Not on file    Family History  Problem Relation Age of Onset  . Stroke Mother   . Diabetes Son   . Hypothyroidism Son   . Stroke Maternal Grandmother   . Heart disease Maternal Grandfather   . Hashimoto's thyroiditis Daughter        Hashimoto's 2  . Migraines Neg Hx     Past Medical History:  Diagnosis Date  . Eczema   . Fatigue   . Hypothyroidism, acquired, autoimmune   . Migraines   . Thyroiditis, autoimmune    Hashimotos     Past Surgical History:  Procedure Laterality Date  . MOLE REMOVAL  2010  . tongue growth  2010  . WISDOM TOOTH EXTRACTION  1999    Current Outpatient Medications  Medication Sig Dispense Refill  . Calcium-Magnesium-Vitamin D (CITRACAL CALCIUM+D) 600-40-500 MG-MG-UNIT TB24 Take 1 tablet at dinner and 1 tablet at bedtime. 90 tablet 1  . cetirizine (ZYRTEC) 10 MG tablet Take 10 mg by mouth daily as needed.     . etonogestrel-ethinyl estradiol (NUVARING) 0.12-0.015 MG/24HR vaginal ring Place 1 each vaginally every 28 (twenty-eight) days. Insert vaginally and leave in place for 28 days then replace.    . Fremanezumab-vfrm (AJOVY) 225 MG/1.5ML SOSY Inject 1 pen into  the skin every 30 (thirty) days. 1.5 mL 12  . Galcanezumab-gnlm (EMGALITY) 120 MG/ML SOAJ Inject 240 mg into the skin once for 1 dose. 2 injections first month. Then 1 injection every month afterwards. 2 mL 0  . Galcanezumab-gnlm (EMGALITY) 120 MG/ML SOAJ Inject 120 mg into the skin every 30 (thirty) days. 1 mL 6  . levothyroxine (SYNTHROID) 112 MCG tablet Take 2 tablets 6 days a week  and 1 tablet 1 day a week 160 tablet 1  . methylPREDNISolone (MEDROL DOSEPAK) 4 MG TBPK tablet Take pills daily, preferably in the morning with food, all at once for 6 days.  6-5-4-3-2-1 21 tablet 1  . naproxen (NAPROSYN) 500 MG tablet Take 500 mg by mouth as needed.     . ondansetron (ZOFRAN-ODT) 4 MG disintegrating tablet Take 1 tablet (4 mg total) by mouth every 8 (eight) hours as needed for nausea or vomiting. 30 tablet 11  . SUMAtriptan (IMITREX) 100 MG tablet TAKE 1 TABLET(100 MG) BY MOUTH 1 TIME. MAY REPEAT IN 2 HOURS IF HEADACHE PERSISTS OR RECURS 10 tablet 0  . traZODone (DESYREL) 50 MG tablet TAKE 1 TABLET(50 MG) BY MOUTH AT BEDTIME AS NEEDED FOR SLEEP 30 tablet 6  . TROKENDI XR 100 MG CP24 TAKE ONE CAPSULE BY MOUTH EVERY NIGHT AT BEDTIME. TAKE WITH TROKENDI XR 50MG  FOR TOTAL DOSE OF 150MG  DAILY EVERY NIGHT AT BEDTIME 30 capsule 11  . TROKENDI XR 50 MG CP24 TAKE 1 CAPSULE BY MOUTH AT BEDTIME. TAKE WITH TROKENDI 100MG  CAPSULE FOR TOTAL DOSE OF 150MG  30 capsule 0   No current facility-administered medications for this visit.    Allergies as of 07/31/2020 - Review Complete 02/19/2020  Allergen Reaction Noted  . Codeine  04/07/2011    Vitals: LMP 01/15/2015 (Exact Date)  Last Weight:  Wt Readings from Last 1 Encounters:  02/19/20 194 lb 3.2 oz (88.1 kg)   Last Height:   Ht Readings from Last 1 Encounters:  02/19/20 5' 7.21" (1.707 m)    Assessment/Plan: Patient with chronic migraines with and without aura, intractable, with status. She has some basilar symptoms,vertigo, she has had retinal  migraines.   Is doing very well on Trokendi and Ajovy and was going many weeks without migraines however today she states her migraines have been worsening, she is having daily migraines with status migrainosus with and without aura they have been intractable.  We will switch her to [facility] and follow-up with her in 4 months.  At visit if she is not doing well on Emgality we may have to discuss Botox injections with her.  Meds ordered this encounter  Medications  . Galcanezumab-gnlm (EMGALITY) 120 MG/ML SOAJ    Sig: Inject 240 mg into the skin once for 1 dose. 2 injections first month. Then 1 injection every month afterwards.    Dispense:  2 mL    Refill:  0  . methylPREDNISolone (MEDROL DOSEPAK) 4 MG TBPK tablet    Sig: Take pills daily, preferably in the morning with food, all at once for 6 days.  6-5-4-3-2-1    Dispense:  21 tablet    Refill:  1  . Galcanezumab-gnlm (EMGALITY) 120 MG/ML SOAJ    Sig: Inject 120 mg into the skin every 30 (thirty) days.    Dispense:  1 mL    Refill:  6    Do not fill until next month, patient has a separate prescription for double injection loading dose for this month.  Every month after she will inject 1 pen.     - For her menstrual migraines, an option is to replace the Nuvaring every 3 weeks and not allow a week without Nuvaring since migraines are during week of period. She is doing well on this.   Will continue Imitrex with alleve and zofran for acute management. Did discuss at length that this medication is contraindicated in patients with hemiplegic or basilar type migraine due to  risk of stroke or other vascular complications. However this is controversial and some studies do not should increased risk. Offered other acute management options. Patient acknowledges risks. If she has any side effects, would like her to stop and call us. Provided UpToDate patient drug information handout. Discussed teratogenicity.   Discussed:  "There is increased  risk for stroke in women with migraine with aura and a contraindication for the combined contraceptive pill for use by women who have migraine with aura. The risk for women with migraine without aura is lower. However other risk factors like smoking are far more likely to increase stroke risk than migraine. There is a recommendation for no smoking and for the use of OCPs without estrogen such as progestogen only pills particularly for women with migraine with aura.[name]. People who have migraine headaches with auras may be 3 times more likely to have a stroke caused by a blood clot, compared to migraine patients who don't see auras. Women who take hormone-replacement therapy may be 30 percent more likely to suffer a clot-based stroke than women not taking medication containing estrogen. Other risk factors like smoking and high blood pressure may be  much more important."  Discussed: To prevent or relieve headaches, try the following: Cool Compress. Lie down and place a cool compress on your head.  Avoid headache triggers. If certain foods or odors seem to have triggered your migraines in the past, avoid them. A headache diary might help you identify triggers.  Include physical activity in your daily routine. Try a daily walk or other moderate aerobic exercise.  Manage stress. Find healthy ways to cope with the stressors, such as delegating tasks on your to-do list.  Practice relaxation techniques. Try deep breathing, yoga, massage and visualization.  Eat regularly. Eating regularly scheduled meals and maintaining a healthy diet might help prevent headaches. Also, drink plenty of fluids.  Follow a regular sleep schedule. Sleep deprivation might contribute to headaches Consider biofeedback. With this mind-body technique, you learn to control certain bodily functions -- such as muscle tension, heart rate and blood pressure -- to prevent headaches or reduce headache pain.    Proceed to emergency room if you  experience new or worsening symptoms or symptoms do not resolve, if you have new neurologic symptoms or if headache is severe, or for any concerning symptom.   Provided education and documentation from American headache Society toolbox including articles on: chronic migraine medication overuse headache, chronic migraines, prevention of migraines, behavioral and other nonpharmacologic treatments for headache.   Naomie DeanAntonia Kariana Wiles, MD  [facility] [address] [place], [place] [id number]  Phone [phone] Fax [phone]

## 2020-07-31 NOTE — Telephone Encounter (Signed)
12 right kidney please call patient and schedule her for 3-month follow-up.  I would prefer Megan but Amy is fine also.  Patient should see NP at this visit.

## 2020-07-31 NOTE — Telephone Encounter (Signed)
Per Delorise Jackson, patient agreed to switch to mychart visit today at 3 pm.

## 2020-08-04 ENCOUNTER — Other Ambulatory Visit: Payer: Self-pay | Admitting: *Deleted

## 2020-08-04 DIAGNOSIS — G43711 Chronic migraine without aura, intractable, with status migrainosus: Secondary | ICD-10-CM

## 2020-08-04 MED ORDER — EMGALITY 120 MG/ML ~~LOC~~ SOAJ: 240.0000 mg | Freq: Once | SUBCUTANEOUS | 0 refills | Status: AC

## 2020-08-05 ENCOUNTER — Telehealth: Payer: Self-pay

## 2020-08-05 DIAGNOSIS — F411 Generalized anxiety disorder: Secondary | ICD-10-CM | POA: Diagnosis not present

## 2020-08-05 NOTE — Telephone Encounter (Signed)
I submitted the PA request for Emgality on CMM, Key: [id number].  Your information has been submitted to [facility]. [employer] [place] will review the request and notify you of the determination decision directly, typically within 72 hours of receiving all information.  You will also receive your request decision electronically. To check for an update later, open this request again from your dashboard.  If [employer] [place] has not responded within the specified timeframe or if you have any questions about your PA submission, contact [employer] [place] directly at [phone].

## 2020-08-14 ENCOUNTER — Other Ambulatory Visit: Payer: Self-pay | Admitting: Neurology

## 2020-08-19 DIAGNOSIS — H43393 Other vitreous opacities, bilateral: Secondary | ICD-10-CM | POA: Diagnosis not present

## 2020-08-19 DIAGNOSIS — H5213 Myopia, bilateral: Secondary | ICD-10-CM | POA: Diagnosis not present

## 2020-08-19 DIAGNOSIS — H52223 Regular astigmatism, bilateral: Secondary | ICD-10-CM | POA: Diagnosis not present

## 2020-08-21 ENCOUNTER — Ambulatory Visit (INDEPENDENT_AMBULATORY_CARE_PROVIDER_SITE_OTHER): Payer: BC Managed Care – PPO | Admitting: "Endocrinology

## 2020-08-29 ENCOUNTER — Other Ambulatory Visit: Payer: Self-pay | Admitting: Neurology

## 2020-09-01 ENCOUNTER — Other Ambulatory Visit: Payer: Self-pay

## 2020-09-01 ENCOUNTER — Encounter: Payer: Self-pay | Admitting: Physician Assistant

## 2020-09-01 ENCOUNTER — Ambulatory Visit (INDEPENDENT_AMBULATORY_CARE_PROVIDER_SITE_OTHER): Payer: BC Managed Care – PPO | Admitting: Physician Assistant

## 2020-09-01 VITALS — BP 100/70 | HR 80 | Temp 98.0°F | Ht 68.0 in | Wt 181.0 lb

## 2020-09-01 DIAGNOSIS — Z114 Encounter for screening for human immunodeficiency virus [HIV]: Secondary | ICD-10-CM | POA: Diagnosis not present

## 2020-09-01 DIAGNOSIS — Z0000 Encounter for general adult medical examination without abnormal findings: Secondary | ICD-10-CM | POA: Diagnosis not present

## 2020-09-01 DIAGNOSIS — Z1159 Encounter for screening for other viral diseases: Secondary | ICD-10-CM | POA: Diagnosis not present

## 2020-09-01 DIAGNOSIS — Z136 Encounter for screening for cardiovascular disorders: Secondary | ICD-10-CM | POA: Diagnosis not present

## 2020-09-01 DIAGNOSIS — E063 Autoimmune thyroiditis: Secondary | ICD-10-CM

## 2020-09-01 DIAGNOSIS — Z13220 Encounter for screening for lipoid disorders: Secondary | ICD-10-CM

## 2020-09-01 DIAGNOSIS — G43909 Migraine, unspecified, not intractable, without status migrainosus: Secondary | ICD-10-CM | POA: Diagnosis not present

## 2020-09-01 DIAGNOSIS — Z23 Encounter for immunization: Secondary | ICD-10-CM

## 2020-09-01 NOTE — Progress Notes (Signed)
Nancy [name] is a 39 y.o. female here to establish are and annual physical.  I acted as a Neurosurgeon for [employer], PA-C Corky Mull, LPN   History of Present Illness:   Chief Complaint  Patient presents with  . Establish Care    Acute Concerns: None  Chronic Issues: Migraines -- started Emgality last month with Dr. Lucia Gaskins. She has been seeing her for at least a year. She continues to struggle to find a regimen that works well for her. She also uses Nuvaring continuously due to hormonal component of symptoms. Hypothyroidism -- sees Dr. Fransico Michael. She is currently on synthroid 224 mcg/d 6 days a week and 112 mcg/d once a week. She has labs done q 3 months with him.  Health Maintenance: Immunizations -- UTD, will give Flu and Tdap today Colonoscopy -- N/A Mammogram -- never PAP -- UTD, 09/2021 Bone Density -- N/A Diet -- overall eats a well balanced diet Sleep habits -- denies significant concerns but can have issues if thyroid becomes unstable Exercise -- walks daily and does yoga Weight -- Weight: 181 lb (82.1 kg)  Mood -- overall stable, sees therapist Alcohol -- none Tobacco -- none  Depression screen PHQ 2/9 09/01/2020  Decreased Interest 0  Down, Depressed, Hopeless 0  PHQ - 2 Score 0    No flowsheet data found.   Other providers/specialists: Patient Care Team: Jarold Motto, [place] as PCP - General (Physician Assistant)   Past Medical History:  Diagnosis Date  . Eczema   . Fatigue   . Hypothyroidism, acquired, autoimmune   . Migraines   . Thyroiditis, autoimmune    Hashimotos   . Vaginal delivery 2006, 2009     Social History   Tobacco Use  . Smoking status: Never Smoker  . Smokeless tobacco: Never Used  Vaping Use  . Vaping Use: Never used  Substance Use Topics  . Alcohol use: No  . Drug use: No    Past Surgical History:  Procedure Laterality Date  . MOLE REMOVAL  2010  . tongue growth  2010  . WISDOM TOOTH EXTRACTION  1999     Family History  Problem Relation Age of Onset  . Stroke Mother   . Lung cancer Mother   . COPD Mother   . Diabetes Mellitus II Mother   . Diabetes Son   . Hypothyroidism Son   . Stroke Maternal Grandmother   . Heart disease Maternal Grandfather   . Hashimoto's thyroiditis Daughter        Hashimoto's 2  . Migraines Neg Hx     Allergies  Allergen Reactions  . Codeine      Current Medications:   Current Outpatient Medications:  .  Calcium-Magnesium-Vitamin D (CITRACAL CALCIUM+D) 600-40-500 MG-MG-UNIT TB24, Take 1 tablet at dinner and 1 tablet at bedtime., Disp: 90 tablet, Rfl: 1 .  cetirizine (ZYRTEC) 10 MG tablet, Take 10 mg by mouth daily as needed. , Disp: , Rfl:  .  etonogestrel-ethinyl estradiol (NUVARING) 0.12-0.015 MG/24HR vaginal ring, Place 1 each vaginally every 28 (twenty-eight) days. Insert vaginally and leave in place for 28 days then replace., Disp: , Rfl:  .  Galcanezumab-gnlm (EMGALITY) 120 MG/ML SOAJ, Inject 120 mg into the skin every 30 (thirty) days., Disp: 1 mL, Rfl: 6 .  levothyroxine (SYNTHROID) 112 MCG tablet, Take 2 tablets 6 days a week and 1 tablet 1 day a week, Disp: 160 tablet, Rfl: 1 .  naproxen (NAPROSYN) 500 MG tablet, Take 500 mg by  mouth as needed. , Disp: , Rfl:  .  ondansetron (ZOFRAN-ODT) 4 MG disintegrating tablet, Take 1 tablet (4 mg total) by mouth every 8 (eight) hours as needed for nausea or vomiting., Disp: 30 tablet, Rfl: 11 .  SUMAtriptan (IMITREX) 100 MG tablet, TAKE 1 TABLET(100 MG) BY MOUTH 1 TIME. MAY REPEAT IN 2 HOURS IF HEADACHE PERSISTS OR RECURS, Disp: 10 tablet, Rfl: 0 .  traZODone (DESYREL) 50 MG tablet, TAKE 1 TABLET(50 MG) BY MOUTH AT BEDTIME AS NEEDED FOR SLEEP, Disp: 30 tablet, Rfl: 6 .  TROKENDI XR 100 MG CP24, TAKE ONE CAPSULE BY MOUTH EVERY NIGHT AT BEDTIME. TAKE WITH TROKENDI XR 50MG  FOR TOTAL DOSE OF 150MG  DAILY EVERY NIGHT AT BEDTIME, Disp: 30 capsule, Rfl: 2 .  TROKENDI XR 50 MG CP24, TAKE 1 CAPSULE BY MOUTH AT  BEDTIME. TAKE WITH TROKENDI 100MG  CAPSULE FOR TOTAL DOSE OF 150MG , Disp: 30 capsule, Rfl: 2   Review of Systems:   Review of Systems  Constitutional: Negative for chills, fever, malaise/fatigue and weight loss.  HENT: Negative for hearing loss, sinus pain and sore throat.   Respiratory: Negative for cough and hemoptysis.   Cardiovascular: Negative for chest pain, palpitations, leg swelling and PND.  Gastrointestinal: Negative for abdominal pain, constipation, diarrhea, heartburn, nausea and vomiting.  Genitourinary: Negative for dysuria, frequency and urgency.  Musculoskeletal: Negative for back pain, myalgias and neck pain.  Skin: Negative for itching and rash.  Neurological: Negative for dizziness, tingling, seizures and headaches.  Endo/Heme/Allergies: Negative for polydipsia.  Psychiatric/Behavioral: Negative for depression. The patient is not nervous/anxious.     Vitals:   Vitals:   09/01/20 0934  BP: 100/70  Pulse: 80  Temp: 98 F (36.7 C)  TempSrc: Temporal  SpO2: 100%  Weight: 181 lb (82.1 kg)  Height: 5\' 8"  (1.727 m)      Body mass index is 27.52 kg/m.  Physical Exam:   Physical Exam Vitals and nursing note reviewed.  Constitutional:      General: She is not in acute distress.    Appearance: Normal appearance. She is well-developed. She is not ill-appearing or toxic-appearing.  HENT:     Head: Normocephalic and atraumatic.     Right Ear: Tympanic membrane, ear canal and external ear normal. Tympanic membrane is not erythematous, retracted or bulging.     Left Ear: Tympanic membrane, ear canal and external ear normal. Tympanic membrane is not erythematous, retracted or bulging.  Eyes:     General: Lids are normal.     Conjunctiva/sclera: Conjunctivae normal.     Pupils: Pupils are equal, round, and reactive to light.  Neck:     Trachea: Trachea normal.  Cardiovascular:     Rate and Rhythm: Normal rate and regular rhythm.     Heart sounds: Normal heart  sounds, S1 normal and S2 normal.  Pulmonary:     Effort: Pulmonary effort is normal. No tachypnea or respiratory distress.     Breath sounds: Normal breath sounds. No decreased breath sounds, wheezing, rhonchi or rales.  Abdominal:     General: Bowel sounds are normal.     Palpations: Abdomen is soft.     Tenderness: There is no abdominal tenderness.  Musculoskeletal:        General: Normal range of motion.     Cervical back: Full passive range of motion without pain.  Lymphadenopathy:     Cervical: No cervical adenopathy.  Skin:    General: Skin is warm and dry.  Neurological:  Mental Status: She is alert.     GCS: GCS eye subscore is 4. GCS verbal subscore is 5. GCS motor subscore is 6.     Cranial Nerves: No cranial nerve deficit.     Sensory: No sensory deficit.     Deep Tendon Reflexes: Reflexes are normal and symmetric.  Psychiatric:        Speech: Speech normal.        Behavior: Behavior normal. Behavior is cooperative.      Assessment and Plan:   Berenice was seen today for establish care.  Diagnoses and all orders for this visit:  Routine physical examination Today patient counseled on age appropriate routine health concerns for screening and prevention, each reviewed and up to date or declined. Immunizations reviewed and up to date or declined. Labs ordered and reviewed. Risk factors for depression reviewed and negative. Hearing function and visual acuity are intact. ADLs screened and addressed as needed. Functional ability and level of safety reviewed and appropriate. Education, counseling and referrals performed based on assessed risks today. Patient provided with a copy of personalized plan for preventive services.  Migraine without status migrainosus, not intractable, unspecified migraine type Mgmt per neurologist. -     Comprehensive metabolic panel; Future -     CBC with Differential/Platelet; Future -     CBC with Differential/Platelet -     Comprehensive  metabolic panel  Hypothyroidism, acquired, autoimmune Mgmt per endocrinology.  Screening for HIV (human immunodeficiency virus) -     HIV Antibody (routine testing w rflx); Future -     HIV Antibody (routine testing w rflx)  Encounter for screening for other viral diseases -     Hepatitis C antibody; Future -     Hepatitis C antibody  Encounter for lipid screening for cardiovascular disease -     Lipid panel; Future -     Lipid panel  CMA or LPN served as scribe during this visit. History, Physical, and Plan performed by medical provider. The above documentation has been reviewed and is accurate and complete.  Jarold Motto, PA-C

## 2020-09-01 NOTE — Patient Instructions (Signed)
It was great to see you!  Please go to the lab for blood work.   Our office will call you with your results unless you have chosen to receive results via MyChart.  If your blood work is normal we will follow-up each year for physicals and as scheduled for chronic medical problems.  If anything is abnormal we will treat accordingly and get you in for a follow-up.  Take care,  Nancy [name]    Health Maintenance, Female Adopting a healthy lifestyle and getting preventive care are important in promoting health and wellness. Ask your health care provider about:  The right schedule for you to have regular tests and exams.  Things you can do on your own to prevent diseases and keep yourself healthy. What should I know about diet, weight, and exercise? Eat a healthy diet   Eat a diet that includes plenty of vegetables, fruits, low-fat dairy products, and lean protein.  Do not eat a lot of foods that are high in solid fats, added sugars, or sodium. Maintain a healthy weight Body mass index (BMI) is used to identify weight problems. It estimates body fat based on height and weight. Your health care provider can help determine your BMI and help you achieve or maintain a healthy weight. Get regular exercise Get regular exercise. This is one of the most important things you can do for your health. Most adults should:  Exercise for at least 150 minutes each week. The exercise should increase your heart rate and make you sweat (moderate-intensity exercise).  Do strengthening exercises at least twice a week. This is in addition to the moderate-intensity exercise.  Spend less time sitting. Even light physical activity can be beneficial. Watch cholesterol and blood lipids Have your blood tested for lipids and cholesterol at 40 years of age, then have this test every 5 years. Have your cholesterol levels checked more often if:  Your lipid or cholesterol levels are high.  You are older than 40  years of age.  You are at high risk for heart disease. What should I know about cancer screening? Depending on your health history and family history, you may need to have cancer screening at various ages. This may include screening for:  Breast cancer.  Cervical cancer.  Colorectal cancer.  Skin cancer.  Lung cancer. What should I know about heart disease, diabetes, and high blood pressure? Blood pressure and heart disease  High blood pressure causes heart disease and increases the risk of stroke. This is more likely to develop in people who have high blood pressure readings, are of African descent, or are overweight.  Have your blood pressure checked: ? Every 3-5 years if you are 18-39 years of age. ? Every year if you are 40 years old or older. Diabetes Have regular diabetes screenings. This checks your fasting blood sugar level. Have the screening done:  Once every three years after age 40 if you are at a normal weight and have a low risk for diabetes.  More often and at a younger age if you are overweight or have a high risk for diabetes. What should I know about preventing infection? Hepatitis B If you have a higher risk for hepatitis B, you should be screened for this virus. Talk with your health care provider to find out if you are at risk for hepatitis B infection. Hepatitis C Testing is recommended for:  Everyone born from 1945 through 1965.  Anyone with known risk factors for hepatitis C. Sexually   transmitted infections (STIs)  Get screened for STIs, including gonorrhea and chlamydia, if: ? You are sexually active and are younger than 40 years of age. ? You are older than 40 years of age and your health care provider tells you that you are at risk for this type of infection. ? Your sexual activity has changed since you were last screened, and you are at increased risk for chlamydia or gonorrhea. Ask your health care provider if you are at risk.  Ask your health  care provider about whether you are at high risk for HIV. Your health care provider may recommend a prescription medicine to help prevent HIV infection. If you choose to take medicine to prevent HIV, you should first get tested for HIV. You should then be tested every 3 months for as long as you are taking the medicine. Pregnancy  If you are about to stop having your period (premenopausal) and you may become pregnant, seek counseling before you get pregnant.  Take 400 to 800 micrograms (mcg) of folic acid every day if you become pregnant.  Ask for birth control (contraception) if you want to prevent pregnancy. Osteoporosis and menopause Osteoporosis is a disease in which the bones lose minerals and strength with aging. This can result in bone fractures. If you are 65 years old or older, or if you are at risk for osteoporosis and fractures, ask your health care provider if you should:  Be screened for bone loss.  Take a calcium or vitamin D supplement to lower your risk of fractures.  Be given hormone replacement therapy (HRT) to treat symptoms of menopause. Follow these instructions at home: Lifestyle  Do not use any products that contain nicotine or tobacco, such as cigarettes, e-cigarettes, and chewing tobacco. If you need help quitting, ask your health care provider.  Do not use street drugs.  Do not share needles.  Ask your health care provider for help if you need support or information about quitting drugs. Alcohol use  Do not drink alcohol if: ? Your health care provider tells you not to drink. ? You are pregnant, may be pregnant, or are planning to become pregnant.  If you drink alcohol: ? Limit how much you use to 0-1 drink a day. ? Limit intake if you are breastfeeding.  Be aware of how much alcohol is in your drink. In the [place] one drink equals one 12 oz bottle of beer (355 mL), one 5 oz glass of wine (148 mL), or one 1 oz glass of hard liquor (44 mL). General  instructions  Schedule regular health, dental, and eye exams.  Stay current with your vaccines.  Tell your health care provider if: ? You often feel depressed. ? You have ever been abused or do not feel safe at home. Summary  Adopting a healthy lifestyle and getting preventive care are important in promoting health and wellness.  Follow your health care provider's instructions about healthy diet, exercising, and getting tested or screened for diseases.  Follow your health care provider's instructions on monitoring your cholesterol and blood pressure. This information is not intended to replace advice given to you by your health care provider. Make sure you discuss any questions you have with your health care provider. Document Revised: 11/22/2018 Document Reviewed: 11/22/2018 Elsevier Patient Education  2020 [employer]  

## 2020-09-02 ENCOUNTER — Other Ambulatory Visit: Payer: Self-pay | Admitting: Neurology

## 2020-09-02 DIAGNOSIS — G43711 Chronic migraine without aura, intractable, with status migrainosus: Secondary | ICD-10-CM

## 2020-09-02 DIAGNOSIS — F411 Generalized anxiety disorder: Secondary | ICD-10-CM | POA: Diagnosis not present

## 2020-09-02 LAB — CBC WITH DIFFERENTIAL/PLATELET
Absolute Monocytes: 302 cells/uL (ref 200–950)
Basophils Absolute: 32 cells/uL (ref 0–200)
Basophils Relative: 0.7 %
Eosinophils Absolute: 41 cells/uL (ref 15–500)
Eosinophils Relative: 0.9 %
HCT: 39 % (ref 35.0–45.0)
Hemoglobin: 13.1 g/dL (ref 11.7–15.5)
Lymphs Abs: 1629 cells/uL (ref 850–3900)
MCH: 30.1 pg (ref 27.0–33.0)
MCHC: 33.6 g/dL (ref 32.0–36.0)
MCV: 89.7 fL (ref 80.0–100.0)
MPV: 11.4 fL (ref 7.5–12.5)
Monocytes Relative: 6.7 %
Neutro Abs: 2498 cells/uL (ref 1500–7800)
Neutrophils Relative %: 55.5 %
Platelets: 193 10*3/uL (ref 140–400)
RBC: 4.35 10*6/uL (ref 3.80–5.10)
RDW: 12.7 % (ref 11.0–15.0)
Total Lymphocyte: 36.2 %
WBC: 4.5 10*3/uL (ref 3.8–10.8)

## 2020-09-02 LAB — COMPREHENSIVE METABOLIC PANEL
AG Ratio: 1.5 (calc) (ref 1.0–2.5)
ALT: 12 U/L (ref 6–29)
AST: 13 U/L (ref 10–30)
Albumin: 4.3 g/dL (ref 3.6–5.1)
Alkaline phosphatase (APISO): 33 U/L (ref 31–125)
BUN: 16 mg/dL (ref 7–25)
CO2: 26 mmol/L (ref 20–32)
Calcium: 9.8 mg/dL (ref 8.6–10.2)
Chloride: 107 mmol/L (ref 98–110)
Creat: 0.9 mg/dL (ref 0.50–1.10)
Globulin: 2.8 g/dL (calc) (ref 1.9–3.7)
Glucose, Bld: 85 mg/dL (ref 65–99)
Potassium: 4.1 mmol/L (ref 3.5–5.3)
Sodium: 140 mmol/L (ref 135–146)
Total Bilirubin: 0.3 mg/dL (ref 0.2–1.2)
Total Protein: 7.1 g/dL (ref 6.1–8.1)

## 2020-09-02 LAB — LIPID PANEL
Cholesterol: 163 mg/dL (ref <200)
HDL: 58 mg/dL (ref >=50)
LDL Cholesterol (Calc): 72 mg/dL (calc)
Non-HDL Cholesterol (Calc): 105 mg/dL (calc) (ref <130)
Total CHOL/HDL Ratio: 2.8 (calc) (ref <5.0)
Triglycerides: 238 mg/dL — ABNORMAL HIGH (ref <150)

## 2020-09-02 LAB — HIV ANTIBODY (ROUTINE TESTING W REFLEX): HIV 1&2 Ab, 4th Generation: NONREACTIVE

## 2020-09-02 LAB — HEPATITIS C ANTIBODY
Hepatitis C Ab: NONREACTIVE
SIGNAL TO CUT-OFF: 0.01 (ref <1.00)

## 2020-09-10 ENCOUNTER — Telehealth: Payer: Self-pay

## 2020-09-10 DIAGNOSIS — E559 Vitamin D deficiency, unspecified: Secondary | ICD-10-CM | POA: Diagnosis not present

## 2020-09-10 DIAGNOSIS — R252 Cramp and spasm: Secondary | ICD-10-CM | POA: Diagnosis not present

## 2020-09-10 DIAGNOSIS — Z01419 Encounter for gynecological examination (general) (routine) without abnormal findings: Secondary | ICD-10-CM

## 2020-09-10 DIAGNOSIS — E063 Autoimmune thyroiditis: Secondary | ICD-10-CM | POA: Diagnosis not present

## 2020-09-10 NOTE — Telephone Encounter (Signed)
.  Reason for Referral Request:  OBGYN was terminated, looking for new OBGYN  Has Patient been seen by PCP for this complaint?  No, Please schedule patient for appointment for complaint.  Yes, Please find out following information:  Reason:  Referral to which Specialty:OBGYN  Preferred office/ provider:

## 2020-09-11 LAB — T4, FREE: Free T4: 1.4 ng/dL (ref 0.8–1.8)

## 2020-09-11 LAB — VITAMIN D 25 HYDROXY (VIT D DEFICIENCY, FRACTURES): Vit D, 25-Hydroxy: 40 ng/mL (ref 30–100)

## 2020-09-11 LAB — PTH, INTACT AND CALCIUM
Calcium: 9.5 mg/dL (ref 8.6–10.2)
PTH: 16 pg/mL (ref 14–64)

## 2020-09-11 LAB — TSH: TSH: 3.1 mIU/L

## 2020-09-11 LAB — T3, FREE: T3, Free: 2.4 pg/mL (ref 2.3–4.2)

## 2020-09-15 ENCOUNTER — Encounter (INDEPENDENT_AMBULATORY_CARE_PROVIDER_SITE_OTHER): Payer: Self-pay | Admitting: "Endocrinology

## 2020-09-15 ENCOUNTER — Other Ambulatory Visit: Payer: Self-pay

## 2020-09-15 ENCOUNTER — Ambulatory Visit (INDEPENDENT_AMBULATORY_CARE_PROVIDER_SITE_OTHER): Payer: BC Managed Care – PPO | Admitting: "Endocrinology

## 2020-09-15 VITALS — BP 110/74 | HR 76 | Wt 181.8 lb

## 2020-09-15 DIAGNOSIS — R5383 Other fatigue: Secondary | ICD-10-CM | POA: Diagnosis not present

## 2020-09-15 DIAGNOSIS — E559 Vitamin D deficiency, unspecified: Secondary | ICD-10-CM

## 2020-09-15 DIAGNOSIS — E049 Nontoxic goiter, unspecified: Secondary | ICD-10-CM

## 2020-09-15 DIAGNOSIS — E063 Autoimmune thyroiditis: Secondary | ICD-10-CM | POA: Diagnosis not present

## 2020-09-15 DIAGNOSIS — R251 Tremor, unspecified: Secondary | ICD-10-CM

## 2020-09-15 DIAGNOSIS — E2839 Other primary ovarian failure: Secondary | ICD-10-CM

## 2020-09-15 MED ORDER — LEVOTHYROXINE SODIUM 112 MCG PO TABS: ORAL_TABLET | ORAL | 11 refills | Status: DC

## 2020-09-15 NOTE — Patient Instructions (Signed)
Follow up visit in three months. Please repeat thyroid tests in 3 months and all lab tests in 6 months.

## 2020-09-15 NOTE — Progress Notes (Signed)
CC: FU of hypothyroidism, secondary to Hashimoto's thyroiditis, goiter, pallor, and fatigue.  HPI: Nancy [name] is a 39 y.o. Caucasian woman. She was unaccompanied.  1. Nancy [name] had her initial adult endocrine consultation with me on 02/03/11:  A. She was diagnosed with hypothyroidism due to Hashimoto's Thyroiditis in about September 2009, 6-7 months after the birth of her second child. She was started on levothyroxine.   B. When I saw her for that initial visit, she felt better on her Synthroid dose of 50 mcg/day on even-numbered days and 75 mcg/day on odd-numbered days, but was still cold. She had a 20+ gram gland. The remainder of her exam was normal  2. During the past nine years we have dealt with several issues:  A. We have gradually increased her dose of Synthroid/levothyroxine in order to attain a TSH goal of 1.0-2.0. Her symptoms of coldness and fatigue have improved, but not totally resolved.   B. Her weight has fluctuated during this time.   C. Her last regular menstrual period was on July 20th 2016. 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   3. Her last clinic visit was on 02/19/20. At that visit I continued her Synthroid dosage of two of the 112 mcg tablets on 6 days per week, but only one 112 mcg tablet /day on 1 day per week. I continued her Citracal/D, 600/400, three times daily and added one Tums at bedtime.   A. In the interim she has been healthy.    1). She has still been getting tired a lot. She works part-time at 20 hours per week. She is also a full-time wife and mother.  [name]` 2). Amaurie's migraines got a lot worse, so Dr. Lucia GaskinsAhern in [facility] switched her injectable medication, which has caused some improvement.    3). She is no longer having leg cramps and muscle spasms very often.       4). She has been sleeping better with trazodone.    5). She is now doing yoga in the mornings, which is helping her back.   B. She  remains on Synthroid, 224 mcg/day for 6 days each week and 112 mcg/day for 1 day each week. She still takes Citracal-D two-three daily, trazodone, and Topiramate at bedtime.   C. She walks for one mile a day every morning.         4. Pertinent Review of Systems: Constitutional. Baxter HireKristen feels "pretty good".   Energy: Energy level is good in the mornings, but decreases in the early afternoons.  Body temperature: The patient is still very "cold natured".  Weight: She has lost 13 pounds through a combination of eating more carefully and exercising more.   Eyes: Her vision is good. She had a recent eye exam in September 2021. There are no problems with soreness, bulging, or limited range of eye movements.   Neck: She is not aware of any problems relating to the anterior neck and thyroid bed. There have been no significant problems with swelling, pain, soreness, tenderness, pressure, discomfort, or difficulty swallowing. Heart: She feels the expected increase in heart rate during exercise or other physical activities. There have been no significant problems with palpitations, irregular heart beats, chest pain, or chest pressure. Gastrointestinal: She thinks that she has dairy intolerance. Bowel movements are normal. There are no significant complaints of excessive hunger, acid reflux, upset stomach, stomach aches or pains, diarrhea, or constipation. Musculoskeletal:  Hands: There are no significant  problems with hand tremor, sweaty palms, palmar erythema.  Legs: She does not often have calf cramps anymore. No edema.  Psychological: She is doing "good".  She sees a Veterinary surgeon every other week.  Mental: Her brain fog varies with the amount of good sleep that she gets. She has no problems with her abilities to pay attention, to remember, to think, and to make decisions in the mornings, but does not do as well when she fatigues later in the day.       GYN: She has premature ovarian failure.  PAST MEDICAL,  FAMILY, AND SOCIAL HISTORY: 1. 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 The family still lives in [place], [place], where her husband, Seymour Bars, is a Optician, dispensing. Her sister was also diagnosed with hypothyroidism secondary to Hashimoto's thyroiditis. She has a grand aunt who had Parkinson's Dz. [Addendum 02/19/11: Her paternal aunt was recently diagnosed with Hashimoto's disease.] 2. Activities: She has been walking daily.    3. Tobacco, alcohol, and drugs: None 4. PCP: Ms. Jarold Motto, PA 5. Neurology: Dr. Naomie Dean, [facility]  REVIEW OF SYSTEMS: Ms. ladean steinmeyer has no other significant issues involving her other body systems.  PHYSICAL EXAM: BP 110/74   Pulse 76   Wt 181 lb 12.8 oz (82.5 kg)   LMP 01/15/2015 (Exact Date)   BMI 27.64 kg/m   LMP 01/15/2015 (Exact Date)    Wt Readings from Last 3 Encounters:  09/01/20 181 lb (82.1 kg)  02/19/20 194 lb 3.2 oz (88.1 kg)  08/22/19 186 lb 6.4 oz (84.6 kg)    Ht Readings from Last 3 Encounters:  09/01/20 5\' 8"  (1.727 m)  02/19/20 5' 7.21" (1.707 m)  08/22/19 5' 7.32" (1.71 m)   Constitutional: The patient looks healthy and slimmer today. She has lost 13 pounds. She is bright and alert. Her affect and insight are normal.   Head: I do not detect any head tremor today.    Eyes: There is no arcus or proptosis.  Mouth: The oropharynx appears normal. The tongue appears normal. There is normal oral moisture. There is no obvious gingivitis. There is no oral  hyperpigmentation.  Neck: There are no bruits present. The thyroid gland appears normal on inspection. The thyroid gland is again slightly enlarged at about 21 grams. The left lobe is a bit larger than the right today, a change from her last visit.   The consistency of the thyroid gland is normal. There is no thyroid tenderness to palpation. Lungs: The lungs are clear. Air movement is good. Heart: The heart rhythm and rate  appear normal. Heart sounds S1 and S2 are normal. I do not appreciate any pathologic heart murmurs. Abdomen: The abdomen is normal in size today. Bowel sounds are normal. The abdomen is soft and non-tender. There is no obviously palpable hepatomegaly, splenomegaly, or other masses.  Arms: Muscle mass appears appropriate for age.  Hands: She has a trace tremor today. Phalangeal and metacarpophalangeal joints appear normal. Palms are warm. She has no pallor of the fingernail beds when she flexes her fingers. She does not have any hyperpigmentation or erythema of the palms.   Legs: Muscle mass appears appropriate for age. There is no edema.  Neurologic: Muscle strength is normal for age and gender in both the upper and the lower extremities. Muscle tone appears normal. Sensation to touch is normal in the legs and dorsa of the  feet.  LAB DATA:   Labs 09/10/20: TSH 3.10, free T4 1.4, free T3 2.4; PTH 16 (14-64), calcium 9.5 (8.6-10.2), 25-OH vitamin D 40  Labs 7/12 21: TSH 2.16, free T4 1.3, free T3 2.6  Labs 09/01/20: CMP normal; CBC norma;, cholesterol 163, triglycerides 238, HDL 58, LDL 72; Hepatitis C antibody non-reactive; HIV non-reactive  Labs 02/18/20: TSH 2.19, free T4 1.4, free T3 2.5; PTH 21, calcium 9.4, 25-OH vitamin D 31  Labs 12/10/19: TSH 2.17, free T4 1.2, free T3 2.8  Labs 08/14/19: TSH 3.77, free 4 1.4, free T3 2.4; CMP normal, with calcium 9.3; PTH 20 (ref 14-64); 25-OH vitamin D 29  Labs 05/21/19: TSH 0.17, free T4 1.4, free T3 2.9  Labs 02/06/19: TSH 0.14, free T4 1.7, free T3 3.2; PTH 15, calcium 9.1, 25-OH vitamin D 34  Labs 11/29/18: TSH 4.82, free T4 1.1, free T3 2.3  Labs 09/26/18: TSH 5.37, free T4 1.0, free T3 2.2  Labs 06/09/18: TSH 2.97, free T4 1.4, free T3 2.6; PTH 11, calcium 9.7, 25-OH vitamin 38  Labs 04/07/18: TSH 1.95, free T4 1.4, free T3 2.6  Labs 12/08/17: TSH 1.53, free T4 1.4, free T3 2.5  Labs 06/02/17: TSH 1.49, free T4 1.5, free T3 2.6; 1,25-OH  vitamin D 70 (ref 18-72), 25-OH vitamin D 29, magnesium 2.1 (ref 1.5-2.5), phosphorus 3.0 (ref 2.5-4.5), PTH 25 (ref 14-64), calcium 9.3 (8.6-10.2); CMP normal   Labs 02/16/17: TSH 2.62, free T4 1.4, free T3 2.9  11/16/16: TSH 2.85, free T4 1.2, free T3 2.6  08/17/16: TSH 2.234, free T4 1.4, free T3 2.4  04/14/16: TSH 2.90, free T4 1.4, free T3 2.6  10/08/15: TSH 1.098, free T4 1.10, free T3 3.0; CBC normal, iron 97  08/20/15: TSH 1.513, free T4 1.34, free T3 2.8  04/09/15: TSH 2.563, free T4 1.29, free T3 2.6; WBC 4.3, Hgb 13.3, Hct 39.9%; iron 95  10/07/14: TSH 2.478, free T4 1.39, free T3 2.8  04/03/14: TSH 4.281, free T4 1.27, free T3 3.0  10/04/13: TSH 2.393, free T4 1.34, free T3 2.9  03/16/13: TSH 2.862, free T4 1.14, free T3 2.7  01/22/13: TSH 4.261, free T4 1.14, free T3 2.9  06/21/12: TSH 2.427, free T4 1.11, free T3 3.1  03/20/12: TSH 2.980, free T4 1.14, free T3 3.1  ASSESSMENT: 1. Hypothyroidism, acquired, autoimmune:   A. Kodie is clinically euthyroid, with some hypothyroid symptoms. However, her TSH has increased too far above the goal range of 1.0-2.0. She needs a small increase in her levothyroxine dose.   B. The fact that her TSH has increased, despite taking the same amount of levothyroxine, suggests that she has lost more thyrocytes in the past 6 months.   C. It appears that Malania is one of those people who has a very narrow therapeutic window for Synthroid, so we really needs to keep her TSH between 1.0-2.0, which is the ideal goal range for TSH for patients who are on Synthroid/levothyroxine replacement.   D. We will continue to check her TFTs every 3 months and see her every 6 months in follow up.  2. Thyroiditis: Her thyroiditis is clinically quiescent today, but intermittently active.  3. Goiter: Her goiter is still enlarged today, but the lobes have shifted in size once again. . The process of waxing and waning of thyroid gland size over time is c/w evolving  Hashimoto's disease.  4. Pallor: Resolved. 5-6. Fatigue/poor sleep:   A. Her fatigue improved when her thyroid hormone  levels were mid-euthyroid in the past and when she was sleeping better. I suspect that her early awakening has been a major factor in her fatigue.   B. Since switching to trazodone she is sleeping much better.   C. Given her autoimmune hypothyroidism and her presumably autoimmune premature ovarian failure, we need to anticipate possible Addison's disease. 7. Head tremor: None today 8. Tremor: She has trace hand tremor today.    9. 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 With the addition of Topiramate XR and other medications, the frequency and severity of her migraines have markedly improved.    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 This problem has become increasingly more common in the population, paralleling the increasing incidence of autoimmune thyroid disease and other autoimmune diseases in the population at large.  11. Muscle cramps and spasms:   A. The cramps and spasms have been occurring less frequently, but still bother her at times.   B. Given the autoimmune nature of her hypothyroidism and POF, it was quite possible that she might be developing autoimmune hypoparathyroidism. Fortunately, in June 2018 her PTH, calcium, magnesium, phosphorus, and calcitriol levels were normal. Her vitamin D level was a bit low. Since then the vitamin D level has improved.  C. Her calcium and PTH were still good as of June 2019 and February 2020.   D. In her most recent labs, the calcium has increased and the PTH has decreased, but the PTH is now low normal and the calcium is only mid-normal.   E. We need to assess her calcium, PTH, and calcitriol in 6 months.  12. Vitamin D deficiency:   C. Because she has been reducing her dairy intake, and because her intestine absorbs vitamin D less well over time, she needs  exogenous vitamin D. After converting to one Citracal-D at lunch, at dinner, and at bedtime, her 25-OH vitamin D is in the low-normal range.    Plan:  1. Diagnostic: TFTs in 3 months. Check TFTs, calcium, PTH, calcitriol, and vitamin D in 6 months. I put in the orders for the 55-month and 14-month tests.  2. Therapeutic: Change levothyroxine to two of the 112 mcg pills per day. Continue one Citracal, 600/400 tablet three times daily. Add one Tums at bedtime. 3. Patient education: We discussed the fact that she is still gradually losing thyroid cells over time. Therefore we will need to check her TFTs every 3-6 months and increase the dose of Synthroid as needed. We also discussed the need to draw labs to assess her PTH, calcium, and vitamin D status to identify if she develops autoimmune hypoparathyroidism. 4. Follow-up: FU in six months.   Level of Service: This visit lasted in excess of 70 minutes. More than 50% of the visit was devoted to counseling.  Molli Knock, MD, CDE Adult and Pediatric Endocrinology

## 2020-09-16 DIAGNOSIS — F411 Generalized anxiety disorder: Secondary | ICD-10-CM | POA: Diagnosis not present

## 2020-10-02 NOTE — Telephone Encounter (Signed)
I have received Botox charge sheet. Filled out [employer] PA form for Botox 200U for G43.711. I will give to MD to sign.

## 2020-10-02 NOTE — Telephone Encounter (Signed)
Will begin PA process.

## 2020-10-07 NOTE — Telephone Encounter (Signed)
Received approval for Botox from [facility] via fax. PA #[id number] (10/02/20- 04/01/21). Patient will use [facility]. I called [facility] and spoke with Aruna to enroll patient in the pharmacy. Aruna then sent me to the pharmacist, Pattricia Boss, to give a verbal prescription. (1) 200U vial, inject 155 units IM into the head and neck muscles every 90 days for G43.711. I then spoke with Boneta Lucks to give the PA information.   I called the patient and explained the specialty pharmacy process as well as the Botox Savings Program. I gave her the contact information for the specialty pharmacy and advised her to call them at the beginning of next week as I just enrolled her today and it will take time to verify benefits. Patient is aware that it is her responsibility to make sure her Botox gets here for each scheduled injection. I have sent the patient a packet in the mail explaining the specialty pharmacy process/Botox Savings program in depth. Advised that the packet will also have my contact information if she has questions. Patient verbalized understanding, is aware that we will schedule first injection once Botox shipment arrives.

## 2020-10-09 ENCOUNTER — Ambulatory Visit (INDEPENDENT_AMBULATORY_CARE_PROVIDER_SITE_OTHER): Payer: BC Managed Care – PPO | Admitting: Nurse Practitioner

## 2020-10-09 ENCOUNTER — Other Ambulatory Visit: Payer: Self-pay

## 2020-10-09 ENCOUNTER — Encounter: Payer: Self-pay | Admitting: Nurse Practitioner

## 2020-10-09 VITALS — BP 122/78 | Ht 68.0 in | Wt 184.0 lb

## 2020-10-09 DIAGNOSIS — Z79890 Hormone replacement therapy (postmenopausal): Secondary | ICD-10-CM | POA: Diagnosis not present

## 2020-10-09 DIAGNOSIS — Z01419 Encounter for gynecological examination (general) (routine) without abnormal findings: Secondary | ICD-10-CM

## 2020-10-09 MED ORDER — ETONOGESTREL-ETHINYL ESTRADIOL 0.12-0.015 MG/24HR VA RING: 1.0000 | VAGINAL_RING | VAGINAL | 4 refills | Status: DC

## 2020-10-09 NOTE — Patient Instructions (Addendum)
Schedule mammogram! [facility] [phone] [address]  [place], [place] [address]  Health Maintenance for Postmenopausal Women Menopause is a normal process in which your ability to get pregnant comes to an end. This process happens slowly over many months or years, usually between the ages of 48 and 55. Menopause is complete when you have missed your menstrual periods for 12 months. It is important to talk with your health care provider about some of the most common conditions that affect women after menopause (postmenopausal women). These include heart disease, cancer, and bone loss (osteoporosis). Adopting a healthy lifestyle and getting preventive care can help to promote your health and wellness. The actions you take can also lower your chances of developing some of these common conditions. What should I know about menopause? During menopause, you may get a number of symptoms, such as:  Hot flashes. These can be moderate or severe.  Night sweats.  Decrease in sex drive.  Mood swings.  Headaches.  Tiredness.  Irritability.  Memory problems.  Insomnia. Choosing to treat or not to treat these symptoms is a decision that you make with your health care provider. Do I need hormone replacement therapy?  Hormone replacement therapy is effective in treating symptoms that are caused by menopause, such as hot flashes and night sweats.  Hormone replacement carries certain risks, especially as you become older. If you are thinking about using estrogen or estrogen with progestin, discuss the benefits and risks with your health care provider. What is my risk for heart disease and stroke? The risk of heart disease, heart attack, and stroke increases as you age. One of the causes may be a change in the body's hormones during menopause. This can affect how your body uses dietary fats, triglycerides, and cholesterol. Heart attack and stroke are medical  emergencies. There are many things that you can do to help prevent heart disease and stroke. Watch your blood pressure  High blood pressure causes heart disease and increases the risk of stroke. This is more likely to develop in people who have high blood pressure readings, are of African descent, or are overweight.  Have your blood pressure checked: ? Every 3-5 years if you are 18-39 years of age. ? Every year if you are 40 years old or older. Eat a healthy diet   Eat a diet that includes plenty of vegetables, fruits, low-fat dairy products, and lean protein.  Do not eat a lot of foods that are high in solid fats, added sugars, or sodium. Get regular exercise Get regular exercise. This is one of the most important things you can do for your health. Most adults should:  Try to exercise for at least 150 minutes each week. The exercise should increase your heart rate and make you sweat (moderate-intensity exercise).  Try to do strengthening exercises at least twice each week. Do these in addition to the moderate-intensity exercise.  Spend less time sitting. Even light physical activity can be beneficial. Other tips  Work with your health care provider to achieve or maintain a healthy weight.  Do not use any products that contain nicotine or tobacco, such as cigarettes, e-cigarettes, and chewing tobacco. If you need help quitting, ask your health care provider.  Know your numbers. Ask your health care provider to check your cholesterol and your blood sugar (glucose). Continue to have your blood tested as directed by your health care provider. Do I need screening for cancer? Depending on your   your health history and family history, you may need to have cancer screening at different stages of your life. This may include screening for:  Breast cancer.  Cervical cancer.  Lung cancer.  Colorectal cancer. What is my risk for osteoporosis? After menopause, you may be at increased risk for  osteoporosis. Osteoporosis is a condition in which bone destruction happens more quickly than new bone creation. To help prevent osteoporosis or the bone fractures that can happen because of osteoporosis, you may take the following actions:  If you are 77-62 years old, get at least 1,000 mg of calcium and at least 600 mg of vitamin D per day.  If you are older than age 74 but younger than age 67, get at least 1,200 mg of calcium and at least 600 mg of vitamin D per day.  If you are older than age 78, get at least 1,200 mg of calcium and at least 800 mg of vitamin D per day. Smoking and drinking excessive alcohol increase the risk of osteoporosis. Eat foods that are rich in calcium and vitamin D, and do weight-bearing exercises several times each week as directed by your health care provider. How does menopause affect my mental health? Depression may occur at any age, but it is more common as you become older. Common symptoms of depression include:  Low or sad mood.  Changes in sleep patterns.  Changes in appetite or eating patterns.  Feeling an overall lack of motivation or enjoyment of activities that you previously enjoyed.  Frequent crying spells. Talk with your health care provider if you think that you are experiencing depression. General instructions See your health care provider for regular wellness exams and vaccines. This may include:  Scheduling regular health, dental, and eye exams.  Getting and maintaining your vaccines. These include: ? Influenza vaccine. Get this vaccine each year before the flu season begins. ? Pneumonia vaccine. ? Shingles vaccine. ? Tetanus, diphtheria, and pertussis (Tdap) booster vaccine. Your health care provider may also recommend other immunizations. Tell your health care provider if you have ever been abused or do not feel safe at home. Summary  Menopause is a normal process in which your ability to get pregnant comes to an end.  This  condition causes hot flashes, night sweats, decreased interest in sex, mood swings, headaches, or lack of sleep.  Treatment for this condition may include hormone replacement therapy.  Take actions to keep yourself healthy, including exercising regularly, eating a healthy diet, watching your weight, and checking your blood pressure and blood sugar levels.  Get screened for cancer and depression. Make sure that you are up to date with all your vaccines. This information is not intended to replace advice given to you by your health care provider. Make sure you discuss any questions you have with your health care provider. Document Revised: 11/22/2018 Document Reviewed: 11/22/2018 Elsevier Patient Education  2020 [employer].

## 2020-10-09 NOTE — Progress Notes (Signed)
   Jennye Runquist 1981-02-11 [id number]   History:  39 y.o. G2P0002 presents for annual exam. Normal pap history. Premature ovarian failure at age 64. Nuvaring continuously for hormone replacement and management of migraines with good relief. She has just been approved for botox injections for management of migraines. She does have auras with them. Vasectomy. Has not had screening mammogram. Hypothyroidism managed by endocrinology.   Gynecologic History Patient's last menstrual period was 01/15/2015 (exact date).   Contraception: NuvaRing vaginal inserts and vasectomy Last Pap: 09/2018. Results were: normal Last mammogram: Never   Past medical history, past surgical history, family history and social history were all reviewed and documented in the EPIC chart.  ROS:  A ROS was performed and pertinent positives and negatives are included.  Exam:  Vitals:   10/09/20 1417  BP: 122/78  Weight: 184 lb (83.5 kg)  Height: 5\' 8"  (1.727 m)   Body mass index is 27.98 kg/m.  General appearance:  Normal Thyroid:  Symmetrical, normal in size, without palpable masses or nodularity. Respiratory  Auscultation:  Clear without wheezing or rhonchi Cardiovascular  Auscultation:  Regular rate, without rubs, murmurs or gallops  Edema/varicosities:  Not grossly evident Abdominal  Soft,nontender, without masses, guarding or rebound.  Liver/spleen:  No organomegaly noted  Hernia:  None appreciated  Skin  Inspection:  Grossly normal   Breasts: Examined lying and sitting.   Right: Without masses, retractions, discharge or axillary adenopathy.   Left: Without masses, retractions, discharge or axillary adenopathy. Gentitourinary   Inguinal/mons:  Normal without inguinal adenopathy  External genitalia:  Normal  BUS/Urethra/Skene's glands:  Normal  Vagina:  Normal, vaginal ring in place  Cervix:  Normal  Uterus:  Normal in size, shape and contour.  Midline and mobile  Adnexa/parametria:      Rt: Without masses or tenderness.   Lt: Without masses or tenderness.  Anus and perineum: Normal   Assessment/Plan:  39 y.o. 40 for annual exam.   Well female exam with routine gynecological exam - Education provided on SBEs, importance of preventative screenings, current guidelines, high calcium diet, regular exercise, and multivitamin daily. Labs with PCP and endocrinology.  Hormone replacement therapy - Plan: etonogestrel-ethinyl estradiol (NUVARING) 0.12-0.015 MG/24HR vaginal ring. Take continuously for migraine prevention and hot flashes with good relief. Takes Trokendi but husband had vasectomy and patient does not take for pregnancy prevention. Refill x 1 year provided.   Screening for cervical cancer - Normal pap history. Will repeat pap at 5-year interval per guidelines.   Screening for breast cancer -has not had screening mammogram.  Discussed current guidelines and importance of preventative screenings.  Information provided on the breast center and she plans to schedule this soon. Normal breast exam today.   Follow up in 1 year for annual.        01-08-1975 [facility], 2:44 PM 10/09/2020

## 2020-10-13 NOTE — Telephone Encounter (Signed)
I called [employer] and spoke with Aaysha to check on the order. Nancy [name] states that PA is not needed. The order is in medical benefit review and will take a few more days. I will fax over another copy of PA just in case.

## 2020-10-14 DIAGNOSIS — F411 Generalized anxiety disorder: Secondary | ICD-10-CM | POA: Diagnosis not present

## 2020-10-15 NOTE — Telephone Encounter (Signed)
I called [employer] and spoke with Nancy [name] in the insurance department to check the status. Nancy [name] states that they are having trouble running the claim through pharmacy benefits, and believes that it needs to be ran through medical. She gave me the fax number [phone]) to fax a copy of PA and patient's insurance card. Patient's PA has been faxed twice. She states that it will be worked on this morning.

## 2020-10-16 ENCOUNTER — Other Ambulatory Visit: Payer: Self-pay | Admitting: Neurology

## 2020-10-16 DIAGNOSIS — G43009 Migraine without aura, not intractable, without status migrainosus: Secondary | ICD-10-CM

## 2020-10-21 DIAGNOSIS — G43711 Chronic migraine without aura, intractable, with status migrainosus: Secondary | ICD-10-CM | POA: Diagnosis not present

## 2020-10-21 NOTE — Telephone Encounter (Signed)
Received fax from [employer] stating Botox will be delivered tomorrow. Once it arrives, I will call patient to schedule first injection.

## 2020-10-21 NOTE — Telephone Encounter (Signed)
I called [employer] today and spoke with Tonya to see if Botox delivery could be scheduled. She states it is still under benefit review. She reached out to the insurance team who told her that there is someone assigned to the case, and that it should be finished later today. She states we will receive a call.

## 2020-10-23 ENCOUNTER — Ambulatory Visit: Payer: BC Managed Care – PPO | Admitting: Adult Health

## 2020-10-23 DIAGNOSIS — G43711 Chronic migraine without aura, intractable, with status migrainosus: Secondary | ICD-10-CM | POA: Diagnosis not present

## 2020-10-23 NOTE — Progress Notes (Signed)
Botox-200unitsx1 vials Lot: [id number] Expiration: 05/2023 NDC: 0981-1914-78   0.9% Sodium Chloride- 23mL total Lot: [id number] Expiration: 09/2022 NDC: 08657-846-96  Dx: Chronic Migraine SP  Consent signed

## 2020-10-23 NOTE — Progress Notes (Signed)
       BOTOX PROCEDURE NOTE FOR MIGRAINE HEADACHE    Contraindications and precautions discussed with patient(above). Aseptic procedure was observed and patient tolerated procedure. Procedure performed by Butch Penny, NP  The condition has existed for more than 6 months, and pt does not have a diagnosis of ALS, Myasthenia Gravis or Lambert-Eaton Syndrome.  Risks and benefits of injections discussed and pt agrees to proceed with the procedure.  Written consent obtained  These injections are medically necessary. This her is first injections cycle. These injections do not cause sedations or hallucinations which the oral therapies may cause.  Indication/Diagnosis: chronic migraine BOTOX(J0585) injection was performed according to protocol by [employer]. 200 units of BOTOX was dissolved into 4 cc NS.   NDC: [address]-29  Type of toxin: Botox  Botox-200unitsx1 vials Lot: [id number] Expiration: 05/2023 NDC: 4765-4650-35   0.9% Sodium Chloride- 35mL total Lot: [id number] Expiration: 09/2022 NDC: 75170-017-49  Dx: Chronic Migraine SP  Consent signed    Description of procedure:  The patient was placed in a sitting position. The standard protocol was used for Botox as follows, with 5 units of Botox injected at each site:   -Procerus muscle, midline injection  -Corrugator muscle, bilateral injection  -Frontalis muscle, bilateral injection, with 2 sites each side, medial injection was performed in the upper one third of the frontalis muscle, in the region vertical from the medial inferior edge of the superior orbital rim. The lateral injection was again in the upper one third of the forehead vertically above the lateral limbus of the cornea, 1.5 cm lateral to the medial injection site.  -Temporalis muscle injection, 4 sites, bilaterally. The first injection was 3 cm above the tragus of the ear, second injection site was 1.5 cm to 3 cm up from the first injection site in line with the  tragus of the ear. The third injection site was 1.5-3 cm forward between the first 2 injection sites. The fourth injection site was 1.5 cm posterior to the second injection site.  -Occipitalis muscle injection, 3 sites, bilaterally. The first injection was done one half way between the occipital protuberance and the tip of the mastoid process behind the ear. The second injection site was done lateral and superior to the first, 1 fingerbreadth from the first injection. The third injection site was 1 fingerbreadth superiorly and medially from the first injection site.  -Cervical paraspinal muscle injection, 2 sites, bilateral knee first injection site was 1 cm from the midline of the cervical spine, 3 cm inferior to the lower border of the occipital protuberance. The second injection site was 1.5 cm superiorly and laterally to the first injection site.  -Trapezius muscle injection was performed at 3 sites, bilaterally. The first injection site was in the upper trapezius muscle halfway between the inflection point of the neck, and the acromion. The second injection site was one half way between the acromion and the first injection site. The third injection was done between the first injection site and the inflection point of the neck.   Will return for repeat injection in 3 months.   A 200 unit sof Botox was used, 155 units were injected, the rest of the Botox was wasted. The patient tolerated the procedure well, there were no complications of the above procedure.  Butch Penny, MSN, NP-C 10/23/2020, 2:56 PM [facility] [address], [address] [place], [place] [address] [phone]

## 2020-10-28 DIAGNOSIS — F411 Generalized anxiety disorder: Secondary | ICD-10-CM | POA: Diagnosis not present

## 2020-11-11 DIAGNOSIS — F411 Generalized anxiety disorder: Secondary | ICD-10-CM | POA: Diagnosis not present

## 2020-11-14 ENCOUNTER — Other Ambulatory Visit: Payer: Self-pay | Admitting: Neurology

## 2020-11-15 ENCOUNTER — Other Ambulatory Visit: Payer: Self-pay | Admitting: Neurology

## 2020-11-17 ENCOUNTER — Other Ambulatory Visit: Payer: Self-pay | Admitting: Neurology

## 2020-11-18 ENCOUNTER — Ambulatory Visit: Payer: BC Managed Care – PPO | Admitting: Adult Health

## 2020-11-18 ENCOUNTER — Other Ambulatory Visit: Payer: Self-pay | Admitting: Physician Assistant

## 2020-11-18 ENCOUNTER — Encounter: Payer: Self-pay | Admitting: Physician Assistant

## 2020-11-18 DIAGNOSIS — Z91018 Allergy to other foods: Secondary | ICD-10-CM

## 2020-12-01 DIAGNOSIS — G43711 Chronic migraine without aura, intractable, with status migrainosus: Secondary | ICD-10-CM

## 2020-12-01 NOTE — Telephone Encounter (Signed)
Received another [employer] PA. Completed this on Cover My Meds. Key: [id number] - Rx #: [id number]. Awaiting determination from [employer].

## 2020-12-08 ENCOUNTER — Ambulatory Visit: Payer: BC Managed Care – PPO | Admitting: Adult Health

## 2020-12-09 NOTE — Telephone Encounter (Signed)
Received fax from [facility] that they are needing more info for PA emgality/continuation of care. Form completed, waiting on signature from Dr. Pearlean Brownie since Dr. Lucia Gaskins is out. Once signed, will fax back.

## 2020-12-09 NOTE — Telephone Encounter (Signed)
Faxed completed/signed form to [employer] at [phone]. Received fax confirmation. Waiting on determination.

## 2020-12-12 ENCOUNTER — Other Ambulatory Visit (INDEPENDENT_AMBULATORY_CARE_PROVIDER_SITE_OTHER): Payer: Self-pay | Admitting: "Endocrinology

## 2020-12-16 DIAGNOSIS — F411 Generalized anxiety disorder: Secondary | ICD-10-CM | POA: Diagnosis not present

## 2020-12-16 MED ORDER — EMGALITY 120 MG/ML ~~LOC~~ SOAJ: 120.0000 mg | SUBCUTANEOUS | 0 refills | Status: DC

## 2020-12-16 NOTE — Addendum Note (Signed)
Addended by: Bertram Savin on: 12/16/2020 05:18 PM   Modules accepted: Orders

## 2020-12-16 NOTE — Telephone Encounter (Signed)
Per CMM, Emgality denied on December 11, 2020, but no reasons were given.

## 2020-12-17 NOTE — Telephone Encounter (Signed)
Patient came by office. I confirmed her name and DOB and patient was given the Emgality injections x2 (1 box) to take home. She was provided a copy of her medication list to take home.

## 2020-12-29 ENCOUNTER — Ambulatory Visit: Payer: BC Managed Care – PPO | Admitting: Adult Health

## 2020-12-30 ENCOUNTER — Telehealth: Payer: Self-pay | Admitting: Neurology

## 2020-12-30 NOTE — Telephone Encounter (Signed)
..   Pt understands that although there may be some limitations with this type of visit, we will take all precautions to reduce any security or privacy concerns.  Pt understands that this will be treated like an in office visit and we will file with pt's insurance, and there may be a patient responsible charge related to this service. ? ?

## 2021-01-01 ENCOUNTER — Telehealth (INDEPENDENT_AMBULATORY_CARE_PROVIDER_SITE_OTHER): Payer: BC Managed Care – PPO | Admitting: Adult Health

## 2021-01-01 DIAGNOSIS — G43009 Migraine without aura, not intractable, without status migrainosus: Secondary | ICD-10-CM | POA: Diagnosis not present

## 2021-01-01 NOTE — Progress Notes (Signed)
PATIENT: Nancy [name] DOB: 01-Sep-1981  REASON FOR VISIT: follow up HISTORY FROM: patient  Virtual Visit via Video Note  I connected with Ouida Sills on 01/01/21 at 10:30 AM EST by a video enabled telemedicine application located remotely at [facility] and verified that I am speaking with the correct person using two identifiers who was located at their own home.   I discussed the limitations of evaluation and management by telemedicine and the availability of in person appointments. The patient expressed understanding and agreed to proceed.   PATIENT: Nancy [name] DOB: 1981-11-13  REASON FOR VISIT: follow up HISTORY FROM: patient  HISTORY OF PRESENT ILLNESS: Today 01/01/21  [name] is a 40 year old female with a history of migraine headaches.  She returns today for follow-up.  She is currently on Botox.  She also has samples of Emgality that she has been using.  She states that she was unable to take [facility] in December but recently took a dose 1 week ago.  She states the combination of Trokendi Botox and Emgality has offered her the best benefit.  She has approximately 8-10 headaches a month.  She states that this month has been slightly worse due to the weather.  When she does get a headache she takes sumatriptan and Zofran and Aleve with some benefit.  She reports sometimes the headache resolves and sometimes it does not.  She returns today for an evaluation.  REVIEW OF SYSTEMS: Out of a complete 14 system review of symptoms, the patient complains only of the following symptoms, and all other reviewed systems are negative.  ALLERGIES: Allergies  Allergen Reactions  . Codeine     HOME MEDICATIONS: Outpatient Medications Prior to Visit  Medication Sig Dispense Refill  . Calcium-Magnesium-Vitamin D (CITRACAL CALCIUM+D) 600-40-500 MG-MG-UNIT TB24 Take 1 tablet at dinner and 1 tablet at bedtime. 90 tablet 1   . cetirizine (ZYRTEC) 10 MG tablet Take 10 mg by mouth daily as needed.     . etonogestrel-ethinyl estradiol (NUVARING) 0.12-0.015 MG/24HR vaginal ring Place 1 each vaginally every 28 (twenty-eight) days. Insert vaginally and leave in place for 28 days then replace. 3 each 4  . Galcanezumab-gnlm (EMGALITY) 120 MG/ML SOAJ Inject 120 mg into the skin every 30 (thirty) days. 2 mL 0  . levothyroxine (SYNTHROID) 112 MCG tablet TAKE 2 TABLETS BY MOUTH DAILY 180 tablet 1  . naproxen (NAPROSYN) 500 MG tablet Take 500 mg by mouth as needed.     . ondansetron (ZOFRAN-ODT) 4 MG disintegrating tablet DISSOLVE 1 TABLET(4 MG) ON THE TONGUE EVERY 8 HOURS AS NEEDED FOR NAUSEA OR VOMITING 30 tablet 11  . SUMAtriptan (IMITREX) 100 MG tablet TAKE 1 TABLET(100 MG) BY MOUTH 1 TIME. MAY REPEAT IN 2 HOURS IF HEADACHE PERSISTS OR RECURS 10 tablet 5  . traZODone (DESYREL) 50 MG tablet TAKE 1 TABLET(50 MG) BY MOUTH AT BEDTIME AS NEEDED FOR SLEEP 30 tablet 6  . TROKENDI XR 100 MG CP24 TAKE ONE CAPSULE BY MOUTH EVERY NIGHT AT BEDTIME. TAKE WITH TROKENDI XR 50MG  FOR TOTAL DOSE OF 150MG  DAILY EVERY NIGHT AT BEDTIME 30 capsule 2  . TROKENDI XR 50 MG CP24 TAKE 1 CAPSULE BY MOUTH AT BEDTIME. TAKE WITH TROKENDI 100MG  CAPSULE FOR TOTAL DOSE OF 150MG  30 capsule 2   No facility-administered medications prior to visit.    PAST MEDICAL HISTORY: Past Medical History:  Diagnosis Date  . Eczema   . Fatigue   .  Hypothyroidism, acquired, autoimmune   . Migraines   . Thyroiditis, autoimmune    Hashimotos   . Vaginal delivery 2006, 2009    PAST SURGICAL HISTORY: Past Surgical History:  Procedure Laterality Date  . MOLE REMOVAL  2010  . tongue growth  2010  . WISDOM TOOTH EXTRACTION  1999    FAMILY HISTORY: Family History  Problem Relation Age of Onset  . Stroke Mother   . Lung cancer Mother   . COPD Mother   . Diabetes Mellitus II Mother   . Diabetes Son   . Hypothyroidism Son   . Stroke Maternal Grandmother   .  Heart disease Maternal Grandfather   . Hashimoto's thyroiditis Daughter        Hashimoto's 2  . Migraines Neg Hx     SOCIAL HISTORY: Social History   Socioeconomic History  . Marital status: Married    Spouse name: Seymour Bars  . Number of children: 2  . Years of education: 35  . Highest education level: Not on file  Occupational History  . 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 Smoking status: Never Smoker  . Smokeless tobacco: Never Used  Vaping Use  . Vaping Use: Never used  Substance and Sexual Activity  . Alcohol use: No  . Drug use: No  . Sexual activity: Yes    Birth control/protection: Post-menopausal, Other-see comments, None    Comment: nuvaring for hormone replacement  Other Topics Concern  . 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 Follows all commands speech and language fluent Cranial nerve II-XII:Extraocular movements were full. Facial symmetry noted. uvula tongue midline. Head turning and shoulder shrug  were normal and symmetric. Motor: Good strength throughout subjectively per patient Sensory: Sensory testing is intact to soft touch on all 4 extremities subjectively per patient Coordination: Cerebellar testing reveals good finger-nose-finger  Gait and station: Patient is able to stand from a seated position. gait is normal.  Reflexes: UTA  DIAGNOSTIC DATA (LABS, IMAGING, TESTING) - I reviewed patient records, labs, notes, testing and imaging  myself where available.  Lab Results  Component Value Date   WBC 4.5 09/01/2020   HGB 13.1 09/01/2020   HCT 39.0 09/01/2020   MCV 89.7 09/01/2020   PLT 193 09/01/2020      Component Value Date/Time   NA 140 09/01/2020 1027   K 4.1 09/01/2020 1027   CL 107 09/01/2020 1027   CO2 26 09/01/2020 1027   GLUCOSE 85 09/01/2020 1027   BUN 16 09/01/2020 1027   CREATININE 0.90 09/01/2020 1027   CALCIUM 9.5 09/10/2020 1452   PROT 7.1 09/01/2020 1027   ALBUMIN 4.3 06/07/2017 1536   AST 13 09/01/2020 1027   ALT 12 09/01/2020 1027   ALKPHOS 33 06/07/2017 1536   BILITOT 0.3 09/01/2020 1027   Lab Results  Component Value Date   CHOL 163 09/01/2020   HDL 58 09/01/2020   LDLCALC 72  09/01/2020   TRIG 238 (H) 09/01/2020   CHOLHDL 2.8 09/01/2020    Lab Results  Component Value Date   TSH 3.10 09/10/2020      ASSESSMENT AND PLAN 40 y.o. year old female  has a past medical history of Eczema, Fatigue, Hypothyroidism, acquired, autoimmune, Migraines, Thyroiditis, autoimmune, and Vaginal delivery (2006, 2009). here with:  1.  Migraine headaches  -- Continue Botox -- Continue Trokendi 150 mg daily -- Has 2 additional samples of Emgality that she will take for February & March -- Continue sumatriptan, Zofran and Aleve for abortive therapy -- Follow-up in 6 months or sooner if needed  I spent 25 minutes of face-to-face and non-face-to-face time with patient.  This included previsit chart review, lab review, study review, order entry, electronic health record documentation, patient education.    Butch Penny, MSN, NP-C 01/01/2021, 10:50 AM [facility] [address] [place], [place] [address] [phone]

## 2021-01-08 ENCOUNTER — Ambulatory Visit: Payer: BC Managed Care – PPO | Admitting: Allergy & Immunology

## 2021-01-08 ENCOUNTER — Encounter: Payer: Self-pay | Admitting: Allergy & Immunology

## 2021-01-08 ENCOUNTER — Other Ambulatory Visit: Payer: Self-pay

## 2021-01-08 VITALS — BP 142/80 | HR 99 | Temp 97.9°F | Resp 18 | Ht 67.5 in | Wt 189.0 lb

## 2021-01-08 DIAGNOSIS — K9049 Malabsorption due to intolerance, not elsewhere classified: Secondary | ICD-10-CM

## 2021-01-08 DIAGNOSIS — L299 Pruritus, unspecified: Secondary | ICD-10-CM

## 2021-01-08 NOTE — Progress Notes (Signed)
NEW PATIENT  Date of Service/Encounter:  01/08/21  Referring provider: Jarold MottoWorley, Samantha, PA   Assessment:   Food intolerance  Itching  History of autoimmunity (thyroiditis, chronic migraines, early menopause)  Plan/Recommendations:   1. Adverse food reaction - Testing to all of the foods was negative.  - Copy of testing results provided.  - There is a the low positive predictive value of food allergy testing and hence the high possibility of false positives. - In contrast, food allergy testing has a high negative predictive value, therefore if testing is negative we can be relatively assured that they are indeed negative.  - This might be more of an intolerance versus an allergy.  - You could try doing an elimination diet to see if you can figure out which food is causing this.  - I am going to get some lab work since you are having the itching to see if there is something else going on.  - We will call you in 1-2 weeks with the results of the labs. - I think we can see you as needed for now, unless the labs are concerning in any way.   2. Return if symptoms worsen or fail to improve.   Subjective:   Nancy [name] is a 40 y.o. female presenting today for evaluation of  Chief Complaint  Patient presents with  . Allergic Reaction    Wants to see if she has a food allergy. Dairy makes hands break out. Some foods make her not fel good. Not sure what foods makes her sick. Has a lot of health issues.     Nancy ObeyKristen [place] has a history of the following: Patient Active Problem List   Diagnosis Date Noted  . 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 Chronic migraine without aura, with intractable migraine, so stated, with status migrainosus 07/31/2020  . Sleeping difficulty 10/15/2015  . Migraine with aura and without status migrainosus, not intractable 08/19/2015  . Tremor 04/14/2015  . Goiter 09/21/2012  . Hypothyroidism, acquired, autoimmune   .  Thyroiditis, autoimmune   . Fatigue   . Migraines     History obtained from: chart review and patient.  Nancy [name] was referred by Jarold MottoWorley, Samantha, PA.     Nancy [name] is a 40 y.o. female presenting for an evaluation of possible food allergies.  She has had progressively worsening symptoms for years. She knows that milk can cause breakouts on her hands. Now she is starting to have some issues with cracking of her hands. She tried switching to almond milk and has gone to aot milk. She does continue to do ice cream. She reports that she does not feel good after eating but her triggering foods are confusing. She is wondering what she is eating causing her to feel "yucky". She has never had any allergy testing performed. She has had these issues for years. Migraines got bad six years ago. She had tem first when she was aged 40, but they did not become chronic until 6 years ago.   She does not get hives, throat swelling, or immediate vomiting. Sometimes she just gets chronic abdominal pain. She is on Emgality and now she is starting Botox injections as well.   She does need cetirizine a couple of times a year at the most. She does have itching on her legs and arms. This is not routine and typically in the evening. Dairy  Tends to make this worse. She does take Benadryl which  does sometimes help.   She does have some autoimmune diseases that are causing issues. She has thyroiditis, chronic migraines, and early menopause.   Otherwise, there is no history of other atopic diseases, including asthma, drug allergies, stinging insect allergies, eczema, urticaria or contact dermatitis. There is no significant infectious history. Vaccinations are up to date.    Past Medical History: Patient Active Problem List   Diagnosis Date Noted  . 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 Chronic migraine without aura, with intractable migraine, so stated, with status migrainosus 07/31/2020  . Sleeping  difficulty 10/15/2015  . Migraine with aura and without status migrainosus, not intractable 08/19/2015  . Tremor 04/14/2015  . Goiter 09/21/2012  . Hypothyroidism, acquired, autoimmune   . Thyroiditis, autoimmune   . Fatigue   . Migraines     Medication List:  Allergies as of 01/08/2021      Reactions   Codeine       Medication List       Accurate as of January 08, 2021  6:43 PM. If you have any questions, ask your nurse or doctor.        Botox 200 units Solr Generic drug: Botulinum Toxin Type A   Calcium-Magnesium-Vitamin D 600-40-500 MG-MG-UNIT Tb24 Commonly known as: CITRACAL CALCIUM+D Take 1 tablet at dinner and 1 tablet at bedtime.   cetirizine 10 MG tablet Commonly known as: ZYRTEC Take 10 mg by mouth daily as needed.   Emgality 120 MG/ML Soaj Generic drug: Galcanezumab-gnlm Inject 120 mg into the skin every 30 (thirty) days.   etonogestrel-ethinyl estradiol 0.12-0.015 MG/24HR vaginal ring Commonly known as: NuvaRing Place 1 each vaginally every 28 (twenty-eight) days. Insert vaginally and leave in place for 28 days then replace.   levothyroxine 112 MCG tablet Commonly known as: SYNTHROID TAKE 2 TABLETS BY MOUTH DAILY   naproxen 500 MG tablet Commonly known as: NAPROSYN Take 500 mg by mouth as needed.   ondansetron 4 MG disintegrating tablet Commonly known as: ZOFRAN-ODT DISSOLVE 1 TABLET(4 MG) ON THE TONGUE EVERY 8 HOURS AS NEEDED FOR NAUSEA OR VOMITING   SUMAtriptan 100 MG tablet Commonly known as: IMITREX TAKE 1 TABLET(100 MG) BY MOUTH 1 TIME. MAY REPEAT IN 2 HOURS IF HEADACHE PERSISTS OR RECURS   traZODone 50 MG tablet Commonly known as: DESYREL TAKE 1 TABLET(50 MG) BY MOUTH AT BEDTIME AS NEEDED FOR SLEEP   Trokendi XR 100 MG Cp24 Generic drug: Topiramate ER TAKE ONE CAPSULE BY MOUTH EVERY NIGHT AT BEDTIME. TAKE WITH TROKENDI XR 50MG  FOR TOTAL DOSE OF 150MG  DAILY EVERY NIGHT AT BEDTIME   Trokendi XR 50 MG Cp24 Generic drug: Topiramate  ER TAKE 1 CAPSULE BY MOUTH AT BEDTIME. TAKE WITH TROKENDI 100MG  CAPSULE FOR TOTAL DOSE OF 150MG        Birth History: non-contributory  Developmental History: non-contributory  Past Surgical History: Past Surgical History:  Procedure Laterality Date  . MOLE REMOVAL  2010  . tongue growth  2010  . WISDOM TOOTH EXTRACTION  1999     Family History: Family History  Problem Relation Age of Onset  . Stroke Mother   . Lung cancer Mother   . COPD Mother   . Diabetes Mellitus II Mother   . Diabetes Son   . Hypothyroidism Son   . Stroke Maternal Grandmother   . Heart disease Maternal Grandfather   . Hashimoto's thyroiditis Daughter        Hashimoto's 2  . Migraines Neg Hx      Social History:  Lafern lives at home with her family. She lives in a house that is 41 years old. There is laminate flooring and rugs throughout the home. They have carpeting in the bedrooms. They have a heat pump for heating and cooling. There is one dog, one cat, two [place] pigs, and one lizard inside of the home. There are no dust mite coverings on the bedding. There is no tobacco exposure in the home. 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 There is no chemical or fume exposure. She does not have a HEPA filter in the house.    Review of Systems  Constitutional: Negative.  Negative for chills, fever, malaise/fatigue and weight loss.  HENT: Negative for congestion, ear discharge, ear pain and sinus pain.   Eyes: Negative for pain, discharge and redness.  Respiratory: Negative for cough, sputum production, shortness of breath and wheezing.   Cardiovascular: Negative.  Negative for chest pain and palpitations.  Gastrointestinal: Negative for abdominal pain, constipation, diarrhea, heartburn, nausea and vomiting.  Skin: Positive for itching. Negative for rash.  Neurological: Negative for dizziness and headaches.   Endo/Heme/Allergies: Negative for environmental allergies. Does not bruise/bleed easily.       Objective:   Blood pressure (!) 142/80, pulse 99, temperature 97.9 F (36.6 C), resp. rate 18, height 5' 7.5" (1.715 m), weight 189 lb (85.7 kg), last menstrual period 01/15/2015, SpO2 98 %. Body mass index is 29.16 kg/m.   Physical Exam:   Physical Exam Constitutional:      Appearance: She is well-developed.     Comments: Anxious. Talks fast.   HENT:     Head: Normocephalic and atraumatic.     Right Ear: Tympanic membrane, ear canal and external ear normal. No drainage, swelling or tenderness. Tympanic membrane is not injected, scarred, erythematous, retracted or bulging.     Left Ear: Tympanic membrane, ear canal and external ear normal. No drainage, swelling or tenderness. Tympanic membrane is not injected, scarred, erythematous, retracted or bulging.     Nose: No nasal deformity, septal deviation, mucosal edema, rhinorrhea or epistaxis.     Right Sinus: No maxillary sinus tenderness or frontal sinus tenderness.     Left Sinus: No maxillary sinus tenderness or frontal sinus tenderness.     Mouth/Throat:     Mouth: Oropharynx is clear and moist. Mucous membranes are not pale and not dry.     Pharynx: Uvula midline.  Eyes:     General:        Right eye: No discharge.        Left eye: No discharge.     Extraocular Movements: EOM normal.     Conjunctiva/sclera: Conjunctivae normal.     Right eye: Right conjunctiva is not injected. No chemosis.    Left eye: Left conjunctiva is not injected. No chemosis.    Pupils: Pupils are equal, round, and reactive to light.  Cardiovascular:     Rate and Rhythm: Normal rate and regular rhythm.     Heart sounds: Normal heart sounds.  Pulmonary:     Effort: Pulmonary effort is normal. No tachypnea, accessory muscle usage or respiratory distress.     Breath sounds: Normal breath sounds. No wheezing, rhonchi or rales.  Chest:     Chest wall: No  tenderness.  Abdominal:     Tenderness: There is no abdominal tenderness. There is no guarding or rebound.  Lymphadenopathy:     Head:  Right side of head: No submandibular, tonsillar or occipital adenopathy.     Left side of head: No submandibular, tonsillar or occipital adenopathy.     Cervical: No cervical adenopathy.  Skin:    Coloration: Skin is not pale.     Findings: No abrasion, erythema, petechiae or rash. Rash is not papular, urticarial or vesicular.  Neurological:     Mental Status: She is alert.  Psychiatric:        Mood and Affect: Mood and affect normal.      Diagnostic studies:   Allergy Studies:     Food Adult Perc - 01/08/21 1500    Time Antigen Placed 1508    Allergen Manufacturer Waynette Buttery    Location Back    Number of allergen test 72     Control-buffer 50% Glycerol Negative    Control-Histamine 1 mg/ml 2+    1. Peanut Negative    2. Soybean Negative    3. Wheat Negative    4. Sesame Negative    5. Milk, cow Negative    6. Egg White, Chicken Negative    7. Casein Negative    8. Shellfish Mix Negative    9. Fish Mix Negative    10. Cashew Negative    11. Pecan Food Negative    12. Walnut Food Negative    13. Almond Negative    14. Hazelnut Negative    15. [place] nut Negative    16. Coconut Negative    17. Pistachio Negative    18. Catfish Negative    19. Bass Negative    20. Trout Negative    21. Tuna Negative    22. Salmon Negative    23. Flounder Negative    24. Codfish Negative    25. Shrimp Negative    26. Crab Negative    27. Lobster Negative    28. Oyster Negative    29. Scallops Negative    30. Barley Negative    31. Oat  Negative    32. Rye  Negative    33. Hops Negative    34. Rice Negative    35. Cottonseed Negative    36. Saccharomyces Cerevisiae  Negative    37. Pork Negative    38. [place] Meat Negative    39. Chicken Meat Negative    40. Beef Negative    41. Lamb Negative    42. Tomato Negative    43. White Potato  Negative    44. Sweet Potato Negative    45. Pea, Green/English Negative    46. Navy Bean Negative    47. Mushrooms Negative    48. Avocado Negative    49. Onion Negative    50. Cabbage Negative    51. Carrots Negative    52. Celery Negative    53. Corn Negative    54. Cucumber Negative    55. Grape (White seedless) Negative    56. Orange  Negative    57. Banana Negative    58. Apple Negative    59. Peach Negative    60. Strawberry Negative    61. Cantaloupe Negative    62. Watermelon Negative    63. Pineapple Negative    64. Chocolate/Cacao bean Negative    65. Karaya Gum Negative    66. Acacia (Arabic Gum) Negative    67. Cinnamon Negative    68. Nutmeg Negative    69. Ginger Negative    70. Garlic Negative    71. Pepper,  black Negative    72. Mustard Negative           Allergy testing results were read and interpreted by myself, documented by clinical staff.         Malachi Bonds, MD [facility]

## 2021-01-08 NOTE — Patient Instructions (Addendum)
1. Adverse food reaction - Testing to all of the foods was negative.  - Copy of testing results provided.  - There is a the low positive predictive value of food allergy testing and hence the high possibility of false positives. - In contrast, food allergy testing has a high negative predictive value, therefore if testing is negative we can be relatively assured that they are indeed negative.  - This might be more of an intolerance versus an allergy.  - You could try doing an elimination diet to see if you can figure out which food is causing this.  - I am going to get some lab work since you are having the itching to see if there is something else going on.  - We will call you in 1-2 weeks with the results of the labs. - I think we can see you as needed for now, unless the labs are concerning in any way.   2. Return if symptoms worsen or fail to improve.    Please inform us of any Emergency Department visits, hospitalizations, or changes in symptoms. Call us before going to the ED for breathing or allergy symptoms since we might be able to fit you in for a sick visit. Feel free to contact us anytime with any questions, problems, or concerns.  It was a pleasure to meet you today!  Websites that have reliable patient information: 1. American Academy of Asthma, Allergy, and Immunology: www.aaaai.org 2. Food Allergy Research and Education (FARE): foodallergy.org 3. Mothers of Asthmatics: http://www.asthmacommunitynetwork.org 4. American College of Allergy, Asthma, and Immunology: www.acaai.org   COVID-19 Vaccine Information can be found at: [email or web address].[email or web address] For questions related to vaccine distribution or appointments, please email [email or web address] or call [phone].     "Like" [place] on Facebook and Instagram for our latest updates!       Make sure you are registered to vote! If you have moved or changed any of your  contact information, you will need to get this updated before voting!  In some cases, you MAY be able to register to vote online: [email or web address]

## 2021-01-12 DIAGNOSIS — E063 Autoimmune thyroiditis: Secondary | ICD-10-CM | POA: Diagnosis not present

## 2021-01-12 LAB — TSH: TSH: 0.81 mIU/L

## 2021-01-12 LAB — T4, FREE: Free T4: 1.4 ng/dL (ref 0.8–1.8)

## 2021-01-12 LAB — T3, FREE: T3, Free: 2.6 pg/mL (ref 2.3–4.2)

## 2021-01-13 DIAGNOSIS — F411 Generalized anxiety disorder: Secondary | ICD-10-CM | POA: Diagnosis not present

## 2021-01-14 ENCOUNTER — Encounter (INDEPENDENT_AMBULATORY_CARE_PROVIDER_SITE_OTHER): Payer: Self-pay

## 2021-01-15 ENCOUNTER — Telehealth: Payer: Self-pay | Admitting: Neurology

## 2021-01-15 NOTE — Telephone Encounter (Signed)
Patient has a Botox appointment 2/15. I called [facility] and spoke with Danielle to check order status. She states they are needing an updated prescription. I was transferred to Daija in the pharmacy and I provided verbal prescription.

## 2021-01-16 LAB — CMP14+EGFR
ALT: 10 IU/L (ref 0–32)
AST: 11 IU/L (ref 0–40)
Albumin/Globulin Ratio: 1.7 (ref 1.2–2.2)
Albumin: 4.6 g/dL (ref 3.8–4.8)
Alkaline Phosphatase: 39 IU/L — ABNORMAL LOW (ref 44–121)
BUN/Creatinine Ratio: 16 (ref 9–23)
BUN: 15 mg/dL (ref 6–24)
Bilirubin Total: <0.2 mg/dL (ref 0.0–1.2)
CO2: 21 mmol/L (ref 20–29)
Calcium: 9.2 mg/dL (ref 8.7–10.2)
Chloride: 107 mmol/L — ABNORMAL HIGH (ref 96–106)
Creatinine, Ser: 0.96 mg/dL (ref 0.57–1.00)
GFR calc Af Amer: 86 mL/min/{1.73_m2} (ref >59)
GFR calc non Af Amer: 74 mL/min/{1.73_m2} (ref >59)
Globulin, Total: 2.7 g/dL (ref 1.5–4.5)
Glucose: 85 mg/dL (ref 65–99)
Potassium: 4.4 mmol/L (ref 3.5–5.2)
Sodium: 144 mmol/L (ref 134–144)
Total Protein: 7.3 g/dL (ref 6.0–8.5)

## 2021-01-16 LAB — SEDIMENTATION RATE: Sed Rate: 6 mm/hr (ref 0–32)

## 2021-01-16 LAB — C-REACTIVE PROTEIN: CRP: 8 mg/L (ref 0–10)

## 2021-01-16 LAB — TRYPTASE: Tryptase: 3.7 ug/L (ref 2.2–13.2)

## 2021-01-16 LAB — MILK COMPONENT PANEL
F076-IgE Alpha Lactalbumin: <0.1 kU/L
F077-IgE Beta Lactoglobulin: <0.1 kU/L
F078-IgE Casein: <0.1 kU/L

## 2021-01-16 LAB — ANTINUCLEAR ANTIBODIES, IFA: ANA Titer 1: NEGATIVE

## 2021-01-16 LAB — CHRONIC URTICARIA: cu index: 2.2 (ref <10)

## 2021-01-17 ENCOUNTER — Other Ambulatory Visit: Payer: Self-pay | Admitting: Neurology

## 2021-01-19 NOTE — Telephone Encounter (Signed)
I called [facility] again today and spoke with Judeth Cornfield to check status. She states that I need to speak with the pharmacy because the verbal prescription that I gave the other day was not placed in the system. I spoke with the pharmacy and provided another verbal prescription.

## 2021-01-20 ENCOUNTER — Other Ambulatory Visit: Payer: Self-pay | Admitting: Neurology

## 2021-01-20 NOTE — Telephone Encounter (Signed)
Received fax from [facility] stating Botox TBD 2/10.

## 2021-01-21 DIAGNOSIS — G43711 Chronic migraine without aura, intractable, with status migrainosus: Secondary | ICD-10-CM | POA: Diagnosis not present

## 2021-01-22 NOTE — Telephone Encounter (Signed)
Medication is here

## 2021-01-27 ENCOUNTER — Ambulatory Visit (INDEPENDENT_AMBULATORY_CARE_PROVIDER_SITE_OTHER): Payer: BC Managed Care – PPO | Admitting: Neurology

## 2021-01-27 DIAGNOSIS — G43711 Chronic migraine without aura, intractable, with status migrainosus: Secondary | ICD-10-CM

## 2021-01-27 NOTE — Progress Notes (Signed)
Botox consent signed Botox- 200 units x 1 vial Lot: [id number] Expiration: 09/2023 NDC: 1115-5208-02  Bacteriostatic 0.9% Sodium Chloride- 13mL total Lot: [id number] Expiration: 01/13/2022 NDC: 2449-7530-05  Dx: R10.211 SP

## 2021-01-27 NOTE — Progress Notes (Signed)
Consent Form Botulism Toxin Injection For Chronic Migraine  No masseters. Gave 3 ajovy samples, not sure we can continue giving samples she may need to see how botox does alone   Reviewed orally with patient, additionally signature is on file:  Botulism toxin has been approved by the Federal drug administration for treatment of chronic migraine. Botulism toxin does not cure chronic migraine and it may not be effective in some patients.  The administration of botulism toxin is accomplished by injecting a small amount of toxin into the muscles of the neck and head. Dosage must be titrated for each individual. Any benefits resulting from botulism toxin tend to wear off after 3 months with a repeat injection required if benefit is to be maintained. Injections are usually done every 3-4 months with maximum effect peak achieved by about 2 or 3 weeks. Botulism toxin is expensive and you should be sure of what costs you will incur resulting from the injection.  The side effects of botulism toxin use for chronic migraine may include:   -Transient, and usually mild, facial weakness with facial injections  -Transient, and usually mild, head or neck weakness with head/neck injections  -Reduction or loss of forehead facial animation due to forehead muscle weakness  -Eyelid drooping  -Dry eye  -Pain at the site of injection or bruising at the site of injection  -Double vision  -Potential unknown long term risks  Contraindications: You should not have Botox if you are pregnant, nursing, allergic to albumin, have an infection, skin condition, or muscle weakness at the site of the injection, or have myasthenia gravis, Lambert-Eaton syndrome, or ALS.  It is also possible that as with any injection, there may be an allergic reaction or no effect from the medication. Reduced effectiveness after repeated injections is sometimes seen and rarely infection at the injection site may occur. All care will be taken to  prevent these side effects. If therapy is given over a long time, atrophy and wasting in the muscle injected may occur. Occasionally the patient's become refractory to treatment because they develop antibodies to the toxin. In this event, therapy needs to be modified.  I have read the above information and consent to the administration of botulism toxin.    BOTOX PROCEDURE NOTE FOR MIGRAINE HEADACHE    Contraindications and precautions discussed with patient(above). Aseptic procedure was observed and patient tolerated procedure. Procedure performed by Dr. Artemio Aly  The condition has existed for more than 6 months, and pt does not have a diagnosis of ALS, Myasthenia Gravis or Lambert-Eaton Syndrome.  Risks and benefits of injections discussed and pt agrees to proceed with the procedure.  Written consent obtained  These injections are medically necessary. Pt  receives good benefits from these injections. These injections do not cause sedations or hallucinations which the oral therapies may cause.  Description of procedure:  The patient was placed in a sitting position. The standard protocol was used for Botox as follows, with 5 units of Botox injected at each site:   -Procerus muscle, midline injection  -Corrugator muscle, bilateral injection  -Frontalis muscle, bilateral injection, with 2 sites each side, medial injection was performed in the upper one third of the frontalis muscle, in the region vertical from the medial inferior edge of the superior orbital rim. The lateral injection was again in the upper one third of the forehead vertically above the lateral limbus of the cornea, 1.5 cm lateral to the medial injection site.  -Temporalis muscle injection, 4  sites, bilaterally. The first injection was 3 cm above the tragus of the ear, second injection site was 1.5 cm to 3 cm up from the first injection site in line with the tragus of the ear. The third injection site was 1.5-3 cm forward  between the first 2 injection sites. The fourth injection site was 1.5 cm posterior to the second injection site.   -Occipitalis muscle injection, 3 sites, bilaterally. The first injection was done one half way between the occipital protuberance and the tip of the mastoid process behind the ear. The second injection site was done lateral and superior to the first, 1 fingerbreadth from the first injection. The third injection site was 1 fingerbreadth superiorly and medially from the first injection site.  -Cervical paraspinal muscle injection, 2 sites, bilateral knee first injection site was 1 cm from the midline of the cervical spine, 3 cm inferior to the lower border of the occipital protuberance. The second injection site was 1.5 cm superiorly and laterally to the first injection site.  -Trapezius muscle injection was performed at 3 sites, bilaterally. The first injection site was in the upper trapezius muscle halfway between the inflection point of the neck, and the acromion. The second injection site was one half way between the acromion and the first injection site. The third injection was done between the first injection site and the inflection point of the neck.   Will return for repeat injection in 3 months.   200 units of Botox was used, any Botox not injected was wasted. The patient tolerated the procedure well, there were no complications of the above procedure.

## 2021-01-30 ENCOUNTER — Telehealth (INDEPENDENT_AMBULATORY_CARE_PROVIDER_SITE_OTHER): Payer: BC Managed Care – PPO | Admitting: Physician Assistant

## 2021-01-30 ENCOUNTER — Encounter: Payer: Self-pay | Admitting: Physician Assistant

## 2021-01-30 VITALS — Ht 67.5 in | Wt 186.0 lb

## 2021-01-30 DIAGNOSIS — T7840XA Allergy, unspecified, initial encounter: Secondary | ICD-10-CM | POA: Diagnosis not present

## 2021-01-30 MED ORDER — EPINEPHRINE 0.3 MG/0.3ML IJ SOAJ: 0.3000 mg | INTRAMUSCULAR | 1 refills | Status: AC | PRN

## 2021-01-30 NOTE — Progress Notes (Signed)
Virtual Visit via Video   I connected with Nancy [name] on 01/30/21 at  4:00 PM EST by a video enabled telemedicine application and verified that I am speaking with the correct person using two identifiers. Location patient: Home Location provider: [facility], Office Persons participating in the virtual visit: Jaynee Winters de Tutwiler, Matthew Cina PA-C, Corky Mull, LPN   I discussed the limitations of evaluation and management by telemedicine and the availability of in person appointments. The patient expressed understanding and agreed to proceed.  I acted as a Neurosurgeon for [employer], PA-C [employer], LPN   Subjective:   HPI:   Allergic reaction Pt says she ate a piece of Keto bread last Thursday then started with heartburn then chest pain for hour or so and then abdominal cramping. The next morning she developed upper lip swelling, sore throat and then lower lip swelling. She took a dose of benadryl and went to sleep. On Saturday she felt pressure in eyes and felt like her feet were on fire. She took another dose of benadryl. Symptoms has resolved since Monday.  Recently had allergy testing with Dr. Dellis Anes two weeks ago and everything was negative.  ROS: See pertinent positives and negatives per HPI.  Patient Active Problem List   Diagnosis Date Noted  . 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 Chronic migraine without aura, with intractable migraine, so stated, with status migrainosus 07/31/2020  . Sleeping difficulty 10/15/2015  . Migraine with aura and without status migrainosus, not intractable 08/19/2015  . Tremor 04/14/2015  . Goiter 09/21/2012  . Hypothyroidism, acquired, autoimmune   . Thyroiditis, autoimmune   . Fatigue   . Migraines     Social History   Tobacco Use  . Smoking status: Never Smoker  . Smokeless tobacco: Never Used  Substance Use Topics  . Alcohol use: No    Current Outpatient Medications:  .  Botulinum Toxin  Type A (BOTOX) 200 units SOLR, , Disp: , Rfl:  .  Calcium-Magnesium-Vitamin D (CITRACAL CALCIUM+D) 600-40-500 MG-MG-UNIT TB24, Take 1 tablet at dinner and 1 tablet at bedtime., Disp: 90 tablet, Rfl: 1 .  cetirizine (ZYRTEC) 10 MG tablet, Take 10 mg by mouth daily as needed. , Disp: , Rfl:  .  EPINEPHrine 0.3 mg/0.3 mL IJ SOAJ injection, Inject 0.3 mg into the muscle as needed for anaphylaxis., Disp: 2 each, Rfl: 1 .  etonogestrel-ethinyl estradiol (NUVARING) 0.12-0.015 MG/24HR vaginal ring, Place 1 each vaginally every 28 (twenty-eight) days. Insert vaginally and leave in place for 28 days then replace., Disp: 3 each, Rfl: 4 .  Galcanezumab-gnlm (EMGALITY) 120 MG/ML SOAJ, Inject 120 mg into the skin every 30 (thirty) days., Disp: 2 mL, Rfl: 0 .  levothyroxine (SYNTHROID) 112 MCG tablet, TAKE 2 TABLETS BY MOUTH DAILY, Disp: 180 tablet, Rfl: 1 .  naproxen (NAPROSYN) 500 MG tablet, Take 500 mg by mouth as needed. , Disp: , Rfl:  .  ondansetron (ZOFRAN-ODT) 4 MG disintegrating tablet, DISSOLVE 1 TABLET(4 MG) ON THE TONGUE EVERY 8 HOURS AS NEEDED FOR NAUSEA OR VOMITING, Disp: 30 tablet, Rfl: 11 .  SUMAtriptan (IMITREX) 100 MG tablet, TAKE 1 TABLET(100 MG) BY MOUTH 1 TIME. MAY REPEAT IN 2 HOURS IF HEADACHE PERSISTS OR RECURS, Disp: 10 tablet, Rfl: 5 .  traZODone (DESYREL) 50 MG tablet, TAKE 1 TABLET(50 MG) BY MOUTH AT BEDTIME AS NEEDED FOR SLEEP, Disp: 30 tablet, Rfl: 0 .  TROKENDI XR 100 MG CP24, TAKE ONE CAPSULE BY MOUTH EVERY NIGHT  AT BEDTIME. TAKE WITH TROKENDI XR 50MG  FOR TOTAL DOSE OF 150MG  DAILY EVERY NIGHT AT BEDTIME, Disp: 30 capsule, Rfl: 2 .  TROKENDI XR 50 MG CP24, TAKE 1 CAPSULE BY MOUTH AT BEDTIME. TAKE WITH TROKENDI 100MG  CAPSULE FOR TOTAL DOSE OF 150MG , Disp: 30 capsule, Rfl: 2  Allergies  Allergen Reactions  . Codeine     Objective:   VITALS: Per patient if applicable, see vitals. GENERAL: Alert, appears well and in no acute distress. HEENT: Atraumatic, conjunctiva clear, no obvious  abnormalities on inspection of external nose and ears. NECK: Normal movements of the head and neck. CARDIOPULMONARY: No increased WOB. Speaking in clear sentences. I:E ratio WNL.  MS: Moves all visible extremities without noticeable abnormality. PSYCH: Pleasant and cooperative, well-groomed. Speech normal rate and rhythm. Affect is appropriate. Insight and judgement are appropriate. Attention is focused, linear, and appropriate.  NEURO: CN grossly intact. Oriented as arrived to appointment on time with no prompting. Moves both UE equally.  SKIN: No obvious lesions, wounds, erythema, or cyanosis noted on face or hands.  Assessment and Plan:   [name] was seen today for allergic reaction.  Diagnoses and all orders for this visit:  Allergic reaction, initial encounter  Other orders -     EPINEPHrine 0.3 mg/0.3 mL IJ SOAJ injection; Inject 0.3 mg into the muscle as needed for anaphylaxis.   Symptoms resolved. No acute distress on my virtual exam. Avoid keto bread. Will send note to allergist to see if any further recommendations. I did prescribe epi-pen if symptoms return and are more acute. Recommend benadryl and pepcid in sx recur.  I discussed the assessment and treatment plan with the patient. The patient was provided an opportunity to ask questions and all were answered. The patient agreed with the plan and demonstrated an understanding of the instructions.   The patient was advised to call back or seek an in-person evaluation if the symptoms worsen or if the condition fails to improve as anticipated.   CMA or LPN served as scribe during this visit. History, Physical, and Plan performed by medical provider. The above documentation has been reviewed and is accurate and complete.  [place], 01/30/2021

## 2021-02-03 ENCOUNTER — Encounter: Payer: Self-pay | Admitting: Physician Assistant

## 2021-02-10 DIAGNOSIS — F411 Generalized anxiety disorder: Secondary | ICD-10-CM | POA: Diagnosis not present

## 2021-02-19 ENCOUNTER — Other Ambulatory Visit: Payer: Self-pay | Admitting: Neurology

## 2021-03-02 NOTE — Patient Instructions (Addendum)
1. Adverse food reaction - Testing to all of the foods was negative on 01/08/2021.   - There is a the low positive predictive value of food allergy testing and hence the high possibility of false positives. - In contrast, food allergy testing has a high negative predictive value, therefore if testing is negative we can be relatively assured that they are indeed negative.  - This might be more of an intolerance versus an allergy.  - You could try doing an elimination diet to see if you can figure out which food is causing this. If your symptoms re-occur, begin a journal of events that occurred for up to 6 hours before your symptoms began including foods and beverages consumed, soaps or perfumes you had contact with, and medications.     Allergic reaction Avoid keto bread. In case of an allergic reaction, give Benadryl 4 teaspoonfuls every 4 hours, and if life-threatening symptoms occur, inject with EpiPen 0.3 mg. We will put in a lab order for tryptase for you to have drawn should you have another reaction. Get alpha gal lab work that was ordered at your last office visit. We will call you with results. We get lab work for soy. We will call you with results once they are back  Please let us know if this treatment plan is not working well for you. Schedule a follow appointment in 2 months or sooner if needed

## 2021-03-04 ENCOUNTER — Other Ambulatory Visit: Payer: Self-pay

## 2021-03-04 ENCOUNTER — Encounter: Payer: Self-pay | Admitting: Family

## 2021-03-04 ENCOUNTER — Ambulatory Visit (INDEPENDENT_AMBULATORY_CARE_PROVIDER_SITE_OTHER): Payer: BC Managed Care – PPO | Admitting: Family

## 2021-03-04 DIAGNOSIS — K9049 Malabsorption due to intolerance, not elsewhere classified: Secondary | ICD-10-CM | POA: Diagnosis not present

## 2021-03-04 DIAGNOSIS — T7840XD Allergy, unspecified, subsequent encounter: Secondary | ICD-10-CM | POA: Diagnosis not present

## 2021-03-04 NOTE — Progress Notes (Signed)
RE: Nancy [name] MRN: [id number] DOB: [birth year] Date of Telemedicine Visit: 03/04/2021  Referring provider: Jarold Motto, PA Primary care provider: Jarold Motto, PA  Chief Complaint: Food Intolerance (Patient gave verbal consent to treat and bill insurance)   Telemedicine Follow Up Visit via Telephone: I connected with Nancy [name] for a follow up on 03/04/21 by telephone and verified that I am speaking with the correct person using two identifiers.   I discussed the limitations, risks, security and privacy concerns of performing an evaluation and management service by telephone and the availability of in person appointments. I also discussed with the patient that there may be a patient responsible charge related to this service. The patient expressed understanding and agreed to proceed.  Patient is at home.  Provider is at the office.  Visit start time: 3:36 PM Visit end time: 4:02 PM Insurance consent/check in by: Suanne Marker Medical consent and medical assistant/nurse: Vonzell Schlatter.  History of Present Illness: She is a 40 y.o. female, who is being followed for adverse food reaction. Her previous allergy office visit was on January 08, 2021 with Dr. Dellis Anes.   Adverse food reaction is reported as doing better since avoiding soy.  She reports that she has tried eliminating soy and the abdominal pain she had been experiencing prior is a lot better.  She mentions that a couple weeks ago she ate a piece of keto bread with cream cheese and then later on that evening had meatloaf, mashed potatoes and green beans for dinner.  She then began to have heartburn and chest pain and then later on abdominal cramping.  The next morning she developed upper lip swelling, sore throat, and then lower lip swelling.  She took a Benadryl and went to sleep.  On Saturday she felt pressure in her eyes and felt like her feet were on fire.  She took another dose of Benadryl and her  symptoms resolved on Monday.  She was prescribed an EpiPen by her primary care physician.  Since then she has been avoiding keto bread and has not had any issues.  She is interested in getting lab work to see if she is allergic to soy since this was a main ingredient in the keto bread.  She reports that she has had cream cheese, green beans, meatloaf, and mashed potatoes since this occurred without any problems.  Assessment and Plan: Nancy [name] is a 40 y.o. female with: Patient Instructions  1. Adverse food reaction - Testing to all of the foods was negative on 01/08/2021.   - There is a the low positive predictive value of food allergy testing and hence the high possibility of false positives. - In contrast, food allergy testing has a high negative predictive value, therefore if testing is negative we can be relatively assured that they are indeed negative.  - This might be more of an intolerance versus an allergy.  - You could try doing an elimination diet to see if you can figure out which food is causing this. If your symptoms re-occur, begin a journal of events that occurred for up to 6 hours before your symptoms began including foods and beverages consumed, soaps or perfumes you had contact with, and medications.     Allergic reaction Avoid keto bread. In case of an allergic reaction, give Benadryl 4 teaspoonfuls every 4 hours, and if life-threatening symptoms occur, inject with EpiPen 0.3 mg. We will put in a lab order for tryptase for you to  have drawn should you have another reaction. Get alpha gal lab work that was ordered at your last office visit. We will call you with results. We get lab work for soy. We will call you with results once they are back  Please let us know if this treatment plan is not working well for you. Schedule a follow appointment in 2 months or sooner if needed            Return in about 2 months (around 05/04/2021), or if symptoms worsen or fail to  improve.  No orders of the defined types were placed in this encounter.  Lab Orders  No laboratory test(s) ordered today    Diagnostics: None.  Medication List:  Current Outpatient Medications  Medication Sig Dispense Refill  . Botulinum Toxin Type A (BOTOX) 200 units SOLR     . Calcium-Magnesium-Vitamin D (CITRACAL CALCIUM+D) 600-40-500 MG-MG-UNIT TB24 Take 1 tablet at dinner and 1 tablet at bedtime. 90 tablet 1  . cetirizine (ZYRTEC) 10 MG tablet Take 10 mg by mouth daily as needed.     [name] EPINEPHrine 0.3 mg/0.3 mL IJ SOAJ injection Inject 0.3 mg into the muscle as needed for anaphylaxis. 2 each 1  . etonogestrel-ethinyl estradiol (NUVARING) 0.12-0.015 MG/24HR vaginal ring Place 1 each vaginally every 28 (twenty-eight) days. Insert vaginally and leave in place for 28 days then replace. 3 each 4  . Fremanezumab-vfrm (AJOVY) 225 MG/1.5ML SOAJ Inject into the skin.    [name] levothyroxine (SYNTHROID) 112 MCG tablet TAKE 2 TABLETS BY MOUTH DAILY 180 tablet 1  . naproxen (NAPROSYN) 500 MG tablet Take 500 mg by mouth as needed.     . ondansetron (ZOFRAN-ODT) 4 MG disintegrating tablet DISSOLVE 1 TABLET(4 MG) ON THE TONGUE EVERY 8 HOURS AS NEEDED FOR NAUSEA OR VOMITING 30 tablet 11  . SUMAtriptan (IMITREX) 100 MG tablet TAKE 1 TABLET(100 MG) BY MOUTH 1 TIME. MAY REPEAT IN 2 HOURS IF HEADACHE PERSISTS OR RECURS 10 tablet 5  . traZODone (DESYREL) 50 MG tablet TAKE 1 TABLET(50 MG) BY MOUTH AT BEDTIME AS NEEDED FOR SLEEP 30 tablet 0  . TROKENDI XR 100 MG CP24 TAKE ONE CAPSULE BY MOUTH EVERY NIGHT AT BEDTIME. TAKE WITH TROKENDI XR 50MG  FOR TOTAL DOSE OF 150MG  DAILY EVERY NIGHT AT BEDTIME 30 capsule 2  . TROKENDI XR 50 MG CP24 TAKE 1 CAPSULE BY MOUTH AT BEDTIME. TAKE WITH TROKENDI 100MG  CAPSULE FOR TOTAL DOSE OF 150MG  30 capsule 2  . Galcanezumab-gnlm (EMGALITY) 120 MG/ML SOAJ Inject 120 mg into the skin every 30 (thirty) days. (Patient not taking: Reported on 03/04/2021) 2 mL 0   No current  facility-administered medications for this visit.   Allergies: Allergies  Allergen Reactions  . Codeine    I reviewed her past medical history, social history, family history, and environmental history and no significant changes have been reported from previous visit on Janular 27th, 2022.  Review of Systems  Constitutional: Negative for chills and fever.  HENT: Negative for congestion, postnasal drip, rhinorrhea and sore throat.   Eyes: Negative for itching.  Respiratory: Negative for cough, chest tightness, shortness of breath and wheezing.   Cardiovascular: Negative for chest pain.  Gastrointestinal:       Denies reflux  Genitourinary: Negative for dysuria.  Neurological: Positive for headaches.       History of migraines- sees neurology   Objective: Physical Exam Not obtained as encounter was done via telephone.   Previous notes and tests were reviewed.  I  discussed the assessment and treatment plan with the patient. The patient was provided an opportunity to ask questions and all were answered. The patient agreed with the plan and demonstrated an understanding of the instructions.   The patient was advised to call back or seek an in-person evaluation if the symptoms worsen or if the condition fails to improve as anticipated.  I provided 26 minutes of non-face-to-face time during this encounter.  It was my pleasure to participate in Nancy [name]'s care today. Please feel free to contact me with any questions or concerns.   Sincerely,  Nehemiah Settle, FNP

## 2021-03-13 DIAGNOSIS — E063 Autoimmune thyroiditis: Secondary | ICD-10-CM | POA: Diagnosis not present

## 2021-03-13 DIAGNOSIS — E559 Vitamin D deficiency, unspecified: Secondary | ICD-10-CM | POA: Diagnosis not present

## 2021-03-15 NOTE — Progress Notes (Signed)
CC: FU of hypothyroidism, secondary to Hashimoto's thyroiditis, goiter, pallor, and fatigue.  HPI: [name] is a 40 y.o. Caucasian woman. She was accompanied by her husband.  1. [name] had her initial adult endocrine consultation with me on 02/03/11:  A. She was diagnosed with hypothyroidism due to Hashimoto's Thyroiditis in about September 2009, 6-7 months after the birth of her second child. She was started on levothyroxine.   B. When I saw her for that initial visit, she felt better on her Synthroid dose of 50 mcg/day on even-numbered days and 75 mcg/day on odd-numbered days, but was still cold. She had a 20+ gram gland. The remainder of her exam was normal  2. During the past nine years we have dealt with several issues:  A. We have gradually increased her dose of Synthroid/levothyroxine in order to attain a TSH goal of 1.0-2.0. Her symptoms of coldness and fatigue have improved, but not totally resolved.   B. Her weight has fluctuated during this time.   C. Her last regular menstrual period was on July 20th 2016. 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   3. Her last clinic visit was on 09/15/20. At that visit I continued her Synthroid dosage of two of the 112 mcg tablets per day. I continued her Citracal/D, 600/400, three times daily and added one Tums at bedtime.   A. In the interim she has been fairly healthy.    1). In mid-February she had an anaphylactic reaction, possibly to keto bread, but she isn't sure. She now has an epipen. She had seen an allergist two weeks prior and her allergy tests were negative. Tests for alpha-gal allergy are pending.    2). She has still been getting tired a lot. Her brain fog is worse. She works part-time at 20 hours per week. She is also a full-time wife and mother.  ` 3). Nancy [name]'s migraines got a lot worse, so Dr. Lucia Gaskins in [facility] added botox to her treatment plan. She is now having  migraines only 2-3 times per month.  The botox has been "miraculous".    4). She is no longer having leg cramps and muscle spasms very often.       5). She has been sleeping better with trazodone.    6). She stopped doing yoga when her migraines got bad. She is still walking a mile every morning. Her back is tight again.     7). Her diet hasn't been as consistent.   B. She remains on Synthroid, 224 mcg/day. She still takes Citracal-D two-three daily and trazodone at bedtime.    4. Pertinent Review of Systems: Constitutional. Nancy [name] feels "pretty good".   Energy: Energy level was better in February and March, but not so good now.   Body temperature: The patient is still very "cold natured".  Weight: She has re-gained 2 pounds.    Eyes: Her vision is good. She had a recent eye exam in September 2021. There are no problems with soreness, bulging, or limited range of eye movements.   Neck: She is not aware of any problems relating to the anterior neck and thyroid bed. There have been no significant problems with swelling, pain, soreness, tenderness, pressure, discomfort, or difficulty swallowing. Heart: She feels the expected increase in heart rate during exercise or other physical activities. There have been no significant problems with palpitations, irregular heart beats, chest pain, or chest pressure. Gastrointestinal: She thinks that she has  dairy intolerance. Bowel movements are normal. There are no significant complaints of excessive hunger, acid reflux, upset stomach, stomach aches or pains, diarrhea, or constipation. Hands: There are no significant problems with hand tremor, sweaty palms, palmar erythema.  Legs: She does not often have calf cramps anymore. No edema.  Psychological: She is doing "better".  She sees a Veterinary surgeon once a month now instead of.every other week.  Mental: Her brain fog has increased, independent of the amount of good sleep that she gets. She has some problems with  memory, but no problems with her abilities to pay attention, to think, and to make decision.       GYN: She has premature ovarian failure.  PAST MEDICAL, FAMILY, AND SOCIAL HISTORY: 1. 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 The family still lives in [place], [place], where her husband, Seymour Bars, is a Optician, dispensing. Her sister was also diagnosed with hypothyroidism secondary to Hashimoto's thyroiditis. She has a grand aunt who had Parkinson's Dz. [Addendum 02/19/11: Her paternal aunt was recently diagnosed with Hashimoto's disease.] 2. Activities: She has been walking daily.    3. Tobacco, alcohol, and drugs: None 4. PCP: Ms. Jarold Motto, PA 5. Neurology: Dr. Naomie Dean, [facility]  REVIEW OF SYSTEMS: [name] has no other significant issues involving her other body systems.  PHYSICAL EXAM: BP 118/76 (BP Location: Right Arm, Patient Position: Sitting, Cuff Size: Normal)   Pulse 70   Wt 183 lb (83 kg)   LMP 01/15/2015 (Exact Date)   BMI 28.24 kg/m   Wt Readings from Last 3 Encounters:  01/30/21 186 lb (84.4 kg)  01/08/21 189 lb (85.7 kg)  10/09/20 184 lb (83.5 kg)    Ht Readings from Last 3 Encounters:  01/30/21 5' 7.5" (1.715 m)  01/08/21 5' 7.5" (1.715 m)  10/09/20 5\' 8"  (1.727 m)   Constitutional: The patient looks healthy today. She has re-gained 2  pounds. She is bright and alert. Her affect and insight are normal.   Head: I do not detect any head tremor today.    Eyes: There is no arcus or proptosis.  Mouth: The oropharynx appears normal. The tongue appears normal. There is normal oral moisture. There is no obvious gingivitis. There is no oral  hyperpigmentation.  Neck: There are no bruits present. The thyroid gland appears normal on inspection. The thyroid gland is top-normal size today at about 20 grams. Today both lobes are equal in size. The consistency of the thyroid gland is normal. There is no  thyroid tenderness to palpation. Lungs: The lungs are clear. Air movement is good. Heart: The heart rhythm and rate appear normal. Heart sounds S1 and S2 are normal. I do not appreciate any pathologic heart murmurs. Abdomen: The abdomen is normal in size today. Bowel sounds are normal. The abdomen is soft and non-tender. There is no obviously palpable hepatomegaly, splenomegaly, or other masses.  Arms: Muscle mass appears appropriate for age.  Hands: She has a trace tremor today. Phalangeal and metacarpophalangeal joints appear normal. Palms are warm. She has no pallor of the fingernail beds when she flexes her fingers. She does not have any hyperpigmentation or erythema of the palms.   Legs: Muscle mass appears appropriate for age. There is no edema.  Neurologic: Muscle strength is normal for age and gender in both the upper and the lower extremities. Muscle tone appears normal. Sensation to touch is  normal in the legs.  LAB DATA:   Labs 03/13/21: TSH 1.50, free T4 1.4, free T3 2.4; 25-OH vitamin D 33; PTH and calcium are pending  Labs 01/12/21: TSH 0.81, free T4 1.4, free T3 2.6  Labs 09/10/20: TSH 3.10, free T4 1.4, free T3 2.4; PTH 16 (14-64), calcium 9.5 (8.6-10.2), 25-OH vitamin D 40  Labs 7/12 21: TSH 2.16, free T4 1.3, free T3 2.6  Labs 09/01/20: CMP normal; CBC normal; cholesterol 163, triglycerides 238, HDL 58, LDL 72; Hepatitis C antibody non-reactive; HIV non-reactive  Labs 02/18/20: TSH 2.19, free T4 1.4, free T3 2.5; PTH 21, calcium 9.4, 25-OH vitamin D 31  Labs 12/10/19: TSH 2.17, free T4 1.2, free T3 2.8  Labs 08/14/19: TSH 3.77, free 4 1.4, free T3 2.4; CMP normal, with calcium 9.3; PTH 20 (ref 14-64); 25-OH vitamin D 29  Labs 05/21/19: TSH 0.17, free T4 1.4, free T3 2.9  Labs 02/06/19: TSH 0.14, free T4 1.7, free T3 3.2; PTH 15, calcium 9.1, 25-OH vitamin D 34  Labs 11/29/18: TSH 4.82, free T4 1.1, free T3 2.3  Labs 09/26/18: TSH 5.37, free T4 1.0, free T3 2.2  Labs  06/09/18: TSH 2.97, free T4 1.4, free T3 2.6; PTH 11, calcium 9.7, 25-OH vitamin 38  Labs 04/07/18: TSH 1.95, free T4 1.4, free T3 2.6  Labs 12/08/17: TSH 1.53, free T4 1.4, free T3 2.5  Labs 06/02/17: TSH 1.49, free T4 1.5, free T3 2.6; 1,25-OH vitamin D 70 (ref 18-72), 25-OH vitamin D 29, magnesium 2.1 (ref 1.5-2.5), phosphorus 3.0 (ref 2.5-4.5), PTH 25 (ref 14-64), calcium 9.3 (8.6-10.2); CMP normal   Labs 02/16/17: TSH 2.62, free T4 1.4, free T3 2.9  11/16/16: TSH 2.85, free T4 1.2, free T3 2.6  08/17/16: TSH 2.234, free T4 1.4, free T3 2.4  04/14/16: TSH 2.90, free T4 1.4, free T3 2.6  10/08/15: TSH 1.098, free T4 1.10, free T3 3.0; CBC normal, iron 97  08/20/15: TSH 1.513, free T4 1.34, free T3 2.8  04/09/15: TSH 2.563, free T4 1.29, free T3 2.6; WBC 4.3, Hgb 13.3, Hct 39.9%; iron 95  10/07/14: TSH 2.478, free T4 1.39, free T3 2.8  04/03/14: TSH 4.281, free T4 1.27, free T3 3.0  10/04/13: TSH 2.393, free T4 1.34, free T3 2.9  03/16/13: TSH 2.862, free T4 1.14, free T3 2.7  01/22/13: TSH 4.261, free T4 1.14, free T3 2.9  06/21/12: TSH 2.427, free T4 1.11, free T3 3.1  03/20/12: TSH 2.980, free T4 1.14, free T3 3.1  ASSESSMENT: 1. Hypothyroidism, acquired, autoimmune:   A. Nancy [name] is clinically euthyroid, with some hypothyroid symptoms. Her TFTs are mid-euthyroid now. Her current levothyroxine dose is good for her now. She may have lost al the thyrocytes she will ever lose.   B. It appears that Nancy [name] is one of those people who has a very narrow therapeutic window for Synthroid, so we really needs to keep her TSH between 1.0-2.0, which is the ideal goal range for TSH for patients who are on Synthroid/levothyroxine replacement.   C. We will continue to check her TFTs every 3 months and see her every 6 months in follow up.  2. Thyroiditis: Her thyroiditis is clinically quiescent today, but intermittently active.  3. Goiter: Her goiter has decreased in size and is now at the top of the  normal range for size. The process of waxing and waning of thyroid gland size over time is c/w evolving Hashimoto's disease.  4. Pallor: Resolved. 5-6. Fatigue/poor sleep:  A. Her fatigue improved when her thyroid hormone levels were mid-euthyroid in the past and when she was sleeping better. I suspect that her early awakening has been a major factor in her fatigue.   B. Since switching to trazodone she is sleeping much better.   C. Unfortunately, she is still fatigued and still has brain fog.   D. Given her autoimmune hypothyroidism and her presumably autoimmune premature ovarian failure, we need to anticipate possible Addison's disease. 7. Head tremor: None today 8. Tremor: She has trace hand tremor today.    9. 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 With the addition of Topiramate XR and other medications, the frequency and severity of her migraines have markedly improved.    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 This problem has become increasingly more common in the population, paralleling the increasing incidence of autoimmune thyroid disease and other autoimmune diseases in the population at large.  11. Muscle cramps and spasms:   A. The cramps and spasms have been occurring less frequently, but still bother her at times.   B. Given the autoimmune nature of her hypothyroidism and POF, it was quite possible that she might be developing autoimmune hypoparathyroidism. Fortunately, in June 2018 her PTH, calcium, magnesium, phosphorus, and calcitriol levels were normal. Her vitamin D level was a bit low. Since then the vitamin D level has improved.  C. Her calcium and PTH were still good as of June 2019 and February 2020.   D. In her most recent labs in September 2021, the calcium had increased and the PTH had decreased, but the PTH was low normal and the calcium was only mid-normal. Her lab tests from 03/13/21 are pending.  E.  We need to assess her calcium, PTH, and calcitriol in 3 and 6 months.  12. Vitamin D deficiency:   A. Because she has been reducing her dairy intake, and because her intestine absorbs vitamin D less well over time, she needs exogenous vitamin D.  B.  After converting to one Citracal-D at lunch, at dinner, and at bedtime, her 25-OH vitamin D has been in the low-normal range.    Plan:  1. Diagnostic: Check ACTH, cortisol, and TFTs in 3 months between 8:00-8:30 AM. . Check TFTs, calcium, PTH, phosphorus, calcitriol, and vitamin D in 6 months. I put in the orders for the 3693-month and 418-month tests.  2. Therapeutic: Continue levothyroxine dose of two of the 112 mcg pills per day. Continue one Citracal, 600/400 tablet three times daily. Add one Tums at bedtime. 3. Patient education: We discussed the fact that she has gradually lost thyroid cells over time. Therefore we will need to check her TFTs every 3-6 months and increase the dose of Synthroid as needed. We also discussed the need to draw labs to assess her PTH, calcium, and vitamin D status to identify if she develops autoimmune hypoparathyroidism. 4. Follow-up: FU in six months.   Level of Service: This visit lasted in excess of 60 minutes. More than 50% of the visit was devoted to counseling.  Molli KnockMichael Zahid Carneiro, MD, CDE Adult and Pediatric Endocrinology

## 2021-03-16 ENCOUNTER — Other Ambulatory Visit: Payer: Self-pay

## 2021-03-16 ENCOUNTER — Encounter (INDEPENDENT_AMBULATORY_CARE_PROVIDER_SITE_OTHER): Payer: Self-pay | Admitting: "Endocrinology

## 2021-03-16 ENCOUNTER — Ambulatory Visit (INDEPENDENT_AMBULATORY_CARE_PROVIDER_SITE_OTHER): Payer: BC Managed Care – PPO | Admitting: "Endocrinology

## 2021-03-16 VITALS — BP 118/76 | HR 70 | Wt 183.0 lb

## 2021-03-16 DIAGNOSIS — G43001 Migraine without aura, not intractable, with status migrainosus: Secondary | ICD-10-CM

## 2021-03-16 DIAGNOSIS — E049 Nontoxic goiter, unspecified: Secondary | ICD-10-CM

## 2021-03-16 DIAGNOSIS — E2839 Other primary ovarian failure: Secondary | ICD-10-CM

## 2021-03-16 DIAGNOSIS — E063 Autoimmune thyroiditis: Secondary | ICD-10-CM | POA: Diagnosis not present

## 2021-03-16 DIAGNOSIS — E559 Vitamin D deficiency, unspecified: Secondary | ICD-10-CM | POA: Diagnosis not present

## 2021-03-16 DIAGNOSIS — G43109 Migraine with aura, not intractable, without status migrainosus: Secondary | ICD-10-CM

## 2021-03-16 DIAGNOSIS — R5383 Other fatigue: Secondary | ICD-10-CM

## 2021-03-16 NOTE — Patient Instructions (Signed)
Follow up visit in 6 months. Please repeat fasting lab tests at about 8 AM in three and six months.

## 2021-03-18 LAB — PTH, INTACT AND CALCIUM
Calcium: 9.5 mg/dL (ref 8.6–10.2)
PTH: 16 pg/mL (ref 16–77)

## 2021-03-18 LAB — VITAMIN D 1,25 DIHYDROXY
Vitamin D 1, 25 (OH)2 Total: 50 pg/mL (ref 18–72)
Vitamin D2 1, 25 (OH)2: <8 pg/mL
Vitamin D3 1, 25 (OH)2: 50 pg/mL

## 2021-03-18 LAB — TSH: TSH: 1.5 mIU/L

## 2021-03-18 LAB — T3, FREE: T3, Free: 2.4 pg/mL (ref 2.3–4.2)

## 2021-03-18 LAB — VITAMIN D 25 HYDROXY (VIT D DEFICIENCY, FRACTURES): Vit D, 25-Hydroxy: 33 ng/mL (ref 30–100)

## 2021-03-18 LAB — T4, FREE: Free T4: 1.4 ng/dL (ref 0.8–1.8)

## 2021-03-23 DIAGNOSIS — K9049 Malabsorption due to intolerance, not elsewhere classified: Secondary | ICD-10-CM | POA: Diagnosis not present

## 2021-03-23 DIAGNOSIS — L299 Pruritus, unspecified: Secondary | ICD-10-CM | POA: Diagnosis not present

## 2021-03-26 DIAGNOSIS — F411 Generalized anxiety disorder: Secondary | ICD-10-CM | POA: Diagnosis not present

## 2021-03-28 LAB — ALPHA-GAL PANEL
Allergen Lamb IgE: <0.1 kU/L
Beef IgE: <0.1 kU/L
IgE (Immunoglobulin E), Serum: 10 IU/mL (ref 6–495)
O215-IgE Alpha-Gal: <0.1 kU/L
Pork IgE: <0.1 kU/L

## 2021-04-07 ENCOUNTER — Telehealth: Payer: Self-pay | Admitting: Neurology

## 2021-04-07 DIAGNOSIS — F411 Generalized anxiety disorder: Secondary | ICD-10-CM | POA: Diagnosis not present

## 2021-04-07 NOTE — Telephone Encounter (Signed)
Patient's next Botox appointment is 5/17. Her PA on file with [employer] for Botox expired on 4/20. I filled out new PA form and gave to MD to sign.

## 2021-04-08 ENCOUNTER — Other Ambulatory Visit: Payer: Self-pay | Admitting: Neurology

## 2021-04-08 NOTE — Telephone Encounter (Signed)
Faxed PA form to [facility] with notes.

## 2021-04-08 NOTE — Telephone Encounter (Signed)
I called the patient. She states she was just using the CGRP while waiting for Botox (3rd round) to become effective. Patient plans to stop CGRP.

## 2021-04-09 NOTE — Telephone Encounter (Signed)
Received approval from [facility] Reference #[id number] (04/08/21- 04/07/22)

## 2021-04-21 DIAGNOSIS — F411 Generalized anxiety disorder: Secondary | ICD-10-CM | POA: Diagnosis not present

## 2021-04-24 ENCOUNTER — Other Ambulatory Visit: Payer: Self-pay | Admitting: Neurology

## 2021-04-27 NOTE — Telephone Encounter (Signed)
Patient's Botox appointment is this Thursday, 5/19. I called [facility] and spoke with Zebulon. Trey Paula states the Botox order is not ready to be scheduled. He transferred me to someone in the insurance department, who states that I need to change the NPI on patient's PA as the pharmacy NPI has changed. I called [employer] and spoke with Morrie Sheldon to update the NPI.

## 2021-04-28 ENCOUNTER — Ambulatory Visit: Payer: Self-pay | Admitting: Neurology

## 2021-04-28 NOTE — Telephone Encounter (Signed)
I called [facility] and spoke with Joni Reining to advise that NPI on PA has been updated. She advised me to check back tomorrow for status of order.

## 2021-04-29 NOTE — Telephone Encounter (Signed)
I called [facility] and spoke with Deja regarding patient's order. She states the order is still processing. I told her that patient's appointment is tomorrow. She called the insurance team and they told her they would get the Botox shipped out tonight.

## 2021-04-30 ENCOUNTER — Ambulatory Visit: Payer: BC Managed Care – PPO | Admitting: Neurology

## 2021-04-30 DIAGNOSIS — G43711 Chronic migraine without aura, intractable, with status migrainosus: Secondary | ICD-10-CM

## 2021-04-30 NOTE — Progress Notes (Signed)
Botox- 200 units x 1 vial Lot: [id number] Expiration: 12/2023 NDC: 3893-7342-87  Bacteriostatic 0.9% Sodium Chloride- 60mL total Lot: [id number] Expiration: 05/13/2022 NDC: 2620-3559-74  Dx: B63.845 SP

## 2021-04-30 NOTE — Telephone Encounter (Signed)
Botox TBD 5/24. Patient came in today for injection. Any toxin used will be replaced with 5/24 shipment.

## 2021-04-30 NOTE — Progress Notes (Signed)
Consent Form Botulism Toxin Injection For Chronic Migraine  04/30/2021: Doing fantastic on botox, only 2 migraines since starting, she did use Ajovy in the beginning but now just on botox. No masseters. Gave ajovy* samples in the past but now on botox alone.   Reviewed orally with patient, additionally signature is on file:  Botulism toxin has been approved by the Federal drug administration for treatment of chronic migraine. Botulism toxin does not cure chronic migraine and it may not be effective in some patients.  The administration of botulism toxin is accomplished by injecting a small amount of toxin into the muscles of the neck and head. Dosage must be titrated for each individual. Any benefits resulting from botulism toxin tend to wear off after 3 months with a repeat injection required if benefit is to be maintained. Injections are usually done every 3-4 months with maximum effect peak achieved by about 2 or 3 weeks. Botulism toxin is expensive and you should be sure of what costs you will incur resulting from the injection.  The side effects of botulism toxin use for chronic migraine may include:   -Transient, and usually mild, facial weakness with facial injections  -Transient, and usually mild, head or neck weakness with head/neck injections  -Reduction or loss of forehead facial animation due to forehead muscle weakness  -Eyelid drooping  -Dry eye  -Pain at the site of injection or bruising at the site of injection  -Double vision  -Potential unknown long term risks  Contraindications: You should not have Botox if you are pregnant, nursing, allergic to albumin, have an infection, skin condition, or muscle weakness at the site of the injection, or have myasthenia gravis, Lambert-Eaton syndrome, or ALS.  It is also possible that as with any injection, there may be an allergic reaction or no effect from the medication. Reduced effectiveness after repeated injections is sometimes seen  and rarely infection at the injection site may occur. All care will be taken to prevent these side effects. If therapy is given over a long time, atrophy and wasting in the muscle injected may occur. Occasionally the patient's become refractory to treatment because they develop antibodies to the toxin. In this event, therapy needs to be modified.  I have read the above information and consent to the administration of botulism toxin.    BOTOX PROCEDURE NOTE FOR MIGRAINE HEADACHE    Contraindications and precautions discussed with patient(above). Aseptic procedure was observed and patient tolerated procedure. Procedure performed by Dr. Artemio Aly  The condition has existed for more than 6 months, and pt does not have a diagnosis of ALS, Myasthenia Gravis or Lambert-Eaton Syndrome.  Risks and benefits of injections discussed and pt agrees to proceed with the procedure.  Written consent obtained  These injections are medically necessary. Pt  receives good benefits from these injections. These injections do not cause sedations or hallucinations which the oral therapies may cause.  Description of procedure:  The patient was placed in a sitting position. The standard protocol was used for Botox as follows, with 5 units of Botox injected at each site:   -Procerus muscle, midline injection  -Corrugator muscle, bilateral injection  -Frontalis muscle, bilateral injection, with 2 sites each side, medial injection was performed in the upper one third of the frontalis muscle, in the region vertical from the medial inferior edge of the superior orbital rim. The lateral injection was again in the upper one third of the forehead vertically above the lateral limbus of the cornea,  1.5 cm lateral to the medial injection site.  -Temporalis muscle injection, 4 sites, bilaterally. The first injection was 3 cm above the tragus of the ear, second injection site was 1.5 cm to 3 cm up from the first injection site  in line with the tragus of the ear. The third injection site was 1.5-3 cm forward between the first 2 injection sites. The fourth injection site was 1.5 cm posterior to the second injection site.   -Occipitalis muscle injection, 3 sites, bilaterally. The first injection was done one half way between the occipital protuberance and the tip of the mastoid process behind the ear. The second injection site was done lateral and superior to the first, 1 fingerbreadth from the first injection. The third injection site was 1 fingerbreadth superiorly and medially from the first injection site.  -Cervical paraspinal muscle injection, 2 sites, bilateral knee first injection site was 1 cm from the midline of the cervical spine, 3 cm inferior to the lower border of the occipital protuberance. The second injection site was 1.5 cm superiorly and laterally to the first injection site.  -Trapezius muscle injection was performed at 3 sites, bilaterally. The first injection site was in the upper trapezius muscle halfway between the inflection point of the neck, and the acromion. The second injection site was one half way between the acromion and the first injection site. The third injection was done between the first injection site and the inflection point of the neck.   Will return for repeat injection in 3 months.   200 units of Botox was used, any Botox not injected was wasted. The patient tolerated the procedure well, there were no complications of the above procedure.

## 2021-05-02 ENCOUNTER — Other Ambulatory Visit: Payer: Self-pay | Admitting: Neurology

## 2021-05-04 NOTE — Patient Instructions (Addendum)
1. Adverse food reaction - Testing to all of the foods was negative on 01/08/2021.   If your symptoms re-occur, begin a journal of events that occurred for up to 6 hours before your symptoms began including foods and beverages consumed, soaps or perfumes you had contact with, and medications.     Allergic reaction Avoid keto bread, soy, and all and mammalian meat. In case of an allergic reaction, give Benadryl 4 teaspoonfuls every 4 hours, and if life-threatening symptoms occur, inject with EpiPen 0.3 mg. Remember that you have an order for tryptase for you to have drawn should you have another reaction. We will reorder the lab work for soy. We will call you with results once they are back  Please let us know if this treatment plan is not working well for you. Schedule a follow appointment in 3 months or sooner if needed

## 2021-05-05 DIAGNOSIS — G43711 Chronic migraine without aura, intractable, with status migrainosus: Secondary | ICD-10-CM | POA: Diagnosis not present

## 2021-05-06 ENCOUNTER — Encounter: Payer: Self-pay | Admitting: Family

## 2021-05-06 ENCOUNTER — Ambulatory Visit (INDEPENDENT_AMBULATORY_CARE_PROVIDER_SITE_OTHER): Payer: BC Managed Care – PPO | Admitting: Family

## 2021-05-06 ENCOUNTER — Other Ambulatory Visit: Payer: Self-pay

## 2021-05-06 VITALS — BP 110/70 | HR 81 | Temp 97.7°F | Resp 18 | Ht 67.0 in | Wt 187.2 lb

## 2021-05-06 DIAGNOSIS — T7840XD Allergy, unspecified, subsequent encounter: Secondary | ICD-10-CM | POA: Diagnosis not present

## 2021-05-06 DIAGNOSIS — K9049 Malabsorption due to intolerance, not elsewhere classified: Secondary | ICD-10-CM

## 2021-05-06 NOTE — Telephone Encounter (Signed)
Received (1) 200 unit vial of Botox today from [facility]. Patient's name was removed from vial and vial was placed in office stock. Lot: [id number] (12/24).

## 2021-05-06 NOTE — Progress Notes (Signed)
[address] [place] [address] Dept: [phone]  FOLLOW UP NOTE  Patient ID: Nancy [name], female    DOB: Aug 03, 1981  Age: 40 y.o. MRN: [id number] Date of Office Visit: 05/06/2021  Assessment  Chief Complaint: Allergic reaction, subsequent encounter  HPI Nancy [name] is a 40 year old female who presents today for follow-up of adverse food reaction and allergic reaction.  She was last seen on March 04, 2021 by Nehemiah Settle, FNP. She reports that she is now receiving Botox injections to help with her migraines and is being tested in July for Addison's Disease and possible parathyroid problems.  She reports that she continues to avoid keto bread and soybean without any accidental ingestion or use of her EpiPen.  She also mentions that she tries to limit the mammalian meat but at times will have some because it tastes so good.  She reports that there was 1 time when she was eating some form of beef ,but she does not remember what kind, she began having stomach ache and cramping.  She denies any concomitant cardiorespiratory and cutaneous symptoms. There are other times that she can eat red meat and not have any problems. Her alpha gal lab work from 03/23/2021 was normal. She mentions that she was not able to get the lab work for soy because [facility] did not show it as being active.   Drug Allergies:  Allergies  Allergen Reactions  . Codeine     Review of Systems: Review of Systems  Constitutional: Negative for chills and fever.  HENT:       Denies rhinorrhea, nasal congestion, and postnasal drip  Eyes:       Denies itchy watery eyes  Respiratory: Negative for cough, shortness of breath and wheezing.   Cardiovascular: Negative for chest pain and palpitations.  Gastrointestinal: Negative for heartburn.  Genitourinary: Negative for dysuria.  Skin: Negative for itching and rash.  Neurological: Positive for headaches.       Reports migraines  and has currently received her third round of Botox injections for this   Physical Exam: BP 110/70 (BP Location: Left Arm, Patient Position: Sitting, Cuff Size: Normal)   Pulse 81   Temp 97.7 F (36.5 C) (Temporal)   Resp 18   Ht 5\' 7"  (1.702 m)   Wt 187 lb 3.2 oz (84.9 kg)   LMP 01/15/2015 (Exact Date)   SpO2 96%   BMI 29.32 kg/m    Physical Exam Constitutional:      Appearance: Normal appearance.  HENT:     Head: Normocephalic and atraumatic.     Comments: Pharynx normal, eyes normal, ears normal, nose normal    Right Ear: Tympanic membrane, ear canal and external ear normal.     Left Ear: Tympanic membrane, ear canal and external ear normal.     Nose: Nose normal.     Mouth/Throat:     Mouth: Mucous membranes are moist.     Pharynx: Oropharynx is clear.  Eyes:     Conjunctiva/sclera: Conjunctivae normal.  Cardiovascular:     Rate and Rhythm: Regular rhythm.     Heart sounds: Normal heart sounds.  Pulmonary:     Effort: Pulmonary effort is normal.     Breath sounds: Normal breath sounds.     Comments: Lungs clear to auscultation Musculoskeletal:     Cervical back: Neck supple.  Skin:    General: Skin is warm.  Neurological:     Mental Status: She is  alert and oriented to person, place, and time.  Psychiatric:        Mood and Affect: Mood normal.        Behavior: Behavior normal.        Thought Content: Thought content normal.        Judgment: Judgment normal.     Diagnostics: None  Assessment and Plan: 1. Allergic reaction, subsequent encounter   2. Food intolerance     No orders of the defined types were placed in this encounter.   Patient Instructions  1. Adverse food reaction - Testing to all of the foods was negative on 01/08/2021.   If your symptoms re-occur, begin a journal of events that occurred for up to 6 hours before your symptoms began including foods and beverages consumed, soaps or perfumes you had contact with, and medications.      Allergic reaction Avoid keto bread, soy, and all and mammalian meat. In case of an allergic reaction, give Benadryl 4 teaspoonfuls every 4 hours, and if life-threatening symptoms occur, inject with EpiPen 0.3 mg. Remember that you have an order for tryptase for you to have drawn should you have another reaction. We will reorder the lab work for soy. We will call you with results once they are back  Please let us know if this treatment plan is not working well for you. Schedule a follow appointment in 3 months or sooner if needed            Return in about 3 months (around 08/06/2021), or if symptoms worsen or fail to improve.    Thank you for the opportunity to care for this patient.  Please do not hesitate to contact me with questions.  Nehemiah Settle, FNP [facility]

## 2021-05-19 DIAGNOSIS — F411 Generalized anxiety disorder: Secondary | ICD-10-CM | POA: Diagnosis not present

## 2021-05-26 ENCOUNTER — Emergency Department (HOSPITAL_BASED_OUTPATIENT_CLINIC_OR_DEPARTMENT_OTHER)
Admission: EM | Admit: 2021-05-26 | Discharge: 2021-05-26 | Disposition: A | Discharge status: Home / Self Care | Payer: BC Managed Care – PPO | Attending: Emergency Medicine | Admitting: Emergency Medicine

## 2021-05-26 ENCOUNTER — Encounter (HOSPITAL_BASED_OUTPATIENT_CLINIC_OR_DEPARTMENT_OTHER): Payer: Self-pay

## 2021-05-26 ENCOUNTER — Emergency Department (HOSPITAL_BASED_OUTPATIENT_CLINIC_OR_DEPARTMENT_OTHER): Payer: BC Managed Care – PPO

## 2021-05-26 ENCOUNTER — Emergency Department (HOSPITAL_BASED_OUTPATIENT_CLINIC_OR_DEPARTMENT_OTHER): Payer: BC Managed Care – PPO | Admitting: Radiology

## 2021-05-26 ENCOUNTER — Encounter: Payer: Self-pay | Admitting: Family Medicine

## 2021-05-26 ENCOUNTER — Telehealth (INDEPENDENT_AMBULATORY_CARE_PROVIDER_SITE_OTHER): Payer: BC Managed Care – PPO | Admitting: Family Medicine

## 2021-05-26 ENCOUNTER — Other Ambulatory Visit: Payer: Self-pay

## 2021-05-26 DIAGNOSIS — R42 Dizziness and giddiness: Secondary | ICD-10-CM | POA: Diagnosis not present

## 2021-05-26 DIAGNOSIS — R531 Weakness: Secondary | ICD-10-CM | POA: Diagnosis not present

## 2021-05-26 DIAGNOSIS — R5383 Other fatigue: Secondary | ICD-10-CM

## 2021-05-26 DIAGNOSIS — R0602 Shortness of breath: Secondary | ICD-10-CM | POA: Insufficient documentation

## 2021-05-26 DIAGNOSIS — R0600 Dyspnea, unspecified: Secondary | ICD-10-CM | POA: Diagnosis not present

## 2021-05-26 DIAGNOSIS — R29898 Other symptoms and signs involving the musculoskeletal system: Secondary | ICD-10-CM

## 2021-05-26 DIAGNOSIS — U071 COVID-19: Secondary | ICD-10-CM

## 2021-05-26 DIAGNOSIS — Z79899 Other long term (current) drug therapy: Secondary | ICD-10-CM | POA: Insufficient documentation

## 2021-05-26 DIAGNOSIS — R0789 Other chest pain: Secondary | ICD-10-CM | POA: Diagnosis not present

## 2021-05-26 DIAGNOSIS — R079 Chest pain, unspecified: Secondary | ICD-10-CM | POA: Diagnosis not present

## 2021-05-26 DIAGNOSIS — E039 Hypothyroidism, unspecified: Secondary | ICD-10-CM | POA: Insufficient documentation

## 2021-05-26 DIAGNOSIS — R21 Rash and other nonspecific skin eruption: Secondary | ICD-10-CM | POA: Diagnosis not present

## 2021-05-26 HISTORY — DX: Autoimmune thyroiditis: E06.3

## 2021-05-26 LAB — TROPONIN I (HIGH SENSITIVITY)
Troponin I (High Sensitivity): <2 ng/L (ref <18)
Troponin I (High Sensitivity): <2 ng/L (ref <18)

## 2021-05-26 LAB — CBC WITH DIFFERENTIAL/PLATELET
Abs Immature Granulocytes: 0.02 10*3/uL (ref 0.00–0.07)
Basophils Absolute: 0 10*3/uL (ref 0.0–0.1)
Basophils Relative: 1 %
Eosinophils Absolute: 0.2 10*3/uL (ref 0.0–0.5)
Eosinophils Relative: 3 %
HCT: 38 % (ref 36.0–46.0)
Hemoglobin: 12.8 g/dL (ref 12.0–15.0)
Immature Granulocytes: 0 %
Lymphocytes Relative: 37 %
Lymphs Abs: 2.2 10*3/uL (ref 0.7–4.0)
MCH: 29.2 pg (ref 26.0–34.0)
MCHC: 33.7 g/dL (ref 30.0–36.0)
MCV: 86.8 fL (ref 80.0–100.0)
Monocytes Absolute: 0.4 10*3/uL (ref 0.1–1.0)
Monocytes Relative: 7 %
Neutro Abs: 3 10*3/uL (ref 1.7–7.7)
Neutrophils Relative %: 52 %
Platelets: 236 10*3/uL (ref 150–400)
RBC: 4.38 MIL/uL (ref 3.87–5.11)
RDW: 12.8 % (ref 11.5–15.5)
WBC: 5.9 10*3/uL (ref 4.0–10.5)
nRBC: 0 % (ref 0.0–0.2)

## 2021-05-26 LAB — COMPREHENSIVE METABOLIC PANEL
ALT: 15 U/L (ref 0–44)
AST: 13 U/L — ABNORMAL LOW (ref 15–41)
Albumin: 4.2 g/dL (ref 3.5–5.0)
Alkaline Phosphatase: 35 U/L — ABNORMAL LOW (ref 38–126)
Anion gap: 11 (ref 5–15)
BUN: 16 mg/dL (ref 6–20)
CO2: 24 mmol/L (ref 22–32)
Calcium: 9.2 mg/dL (ref 8.9–10.3)
Chloride: 107 mmol/L (ref 98–111)
Creatinine, Ser: 0.81 mg/dL (ref 0.44–1.00)
GFR, Estimated: >60 mL/min (ref >60)
Glucose, Bld: 85 mg/dL (ref 70–99)
Potassium: 4.1 mmol/L (ref 3.5–5.1)
Sodium: 142 mmol/L (ref 135–145)
Total Bilirubin: 0.3 mg/dL (ref 0.3–1.2)
Total Protein: 7.4 g/dL (ref 6.5–8.1)

## 2021-05-26 LAB — D-DIMER, QUANTITATIVE: D-Dimer, Quant: 0.74 ug/mL-FEU — ABNORMAL HIGH (ref 0.00–0.50)

## 2021-05-26 LAB — PREGNANCY, URINE: Preg Test, Ur: NEGATIVE

## 2021-05-26 LAB — BRAIN NATRIURETIC PEPTIDE: B Natriuretic Peptide: 13.4 pg/mL (ref 0.0–100.0)

## 2021-05-26 MED ORDER — SODIUM CHLORIDE 0.9 % IV BOLUS
1000.0000 mL | Freq: Once | INTRAVENOUS | Status: AC
  Administered 2021-05-26: 1000 mL via INTRAVENOUS

## 2021-05-26 MED ORDER — IOHEXOL 350 MG/ML SOLN
78.0000 mL | Freq: Once | INTRAVENOUS | Status: AC | PRN
  Administered 2021-05-26: 78 mL via INTRAVENOUS

## 2021-05-26 NOTE — ED Triage Notes (Signed)
Pt presents with diffuse chest tightness, increasing ShOB, cough, fatigued, and generalized weakness. Pt had initial symptoms and positive COVID test on 05/16/2021. Pt had previously been vaccinated for COVID x3.

## 2021-05-26 NOTE — Patient Instructions (Signed)
Seek inperson evaluation right away today as we discussed. Do not drive if you are feeling dizzy. Please cal [phone] if worsening or severe symptoms.   I hope you are feeling better soon!  It was nice to meet you today. I help Nancy [name] out with telemedicine visits on Tuesdays and Thursdays and am available for visits on those days. If you have any concerns or questions following this visit please schedule a follow up visit with your Primary Care doctor or seek care at a local urgent care clinic to avoid delays in care.

## 2021-05-26 NOTE — ED Provider Notes (Signed)
[facility] EMERGENCY DEPT Provider Note   CSN: [id number] Arrival date & time: 05/26/21  1542     History Chief Complaint  Patient presents with   Chest Pain    Nancy [name] is a 40 y.o. female.  HPI     40 year old female with a history of hypothyroidism, Hashimoto's disease, chronic migraine, presents with concern for chest pain in setting of diagnosis of COVID 19 10 days ago.   COVID 6/4, initially like a cold, but dizziness now has been increasing over the weekend. Still has a cough but feels like chest has a pressure and today began to have weakness bilateral arms, if stand up or move around feels really dizzy, fatigue that has been persistent.  Has had this sensation of bilateral arm weakness with  prior migraines/complicated migraines but has not had that kind of headache. Shortness of breath especially with moving around, talking.  Chest pain like a "weight of water" like needs to concentrate on breathing.  No fevers since 6/5.  Nasal congestion improved.  No sore throat.   No smoking, lung disease, etoh or other drugs No immediate fam hx of heart disease.  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   Chronic migraine without aura, with intractable migraine, so stated, with status migrainosus 07/31/2020   Sleeping difficulty 10/15/2015   Migraine with aura and without status migrainosus, not intractable 08/19/2015   Tremor 04/14/2015   Goiter 09/21/2012   Hypothyroidism, acquired, autoimmune    Thyroiditis, autoimmune    Fatigue     Migraines     Past Surgical History:  Procedure Laterality Date   MOLE REMOVAL  2010   tongue growth  2010   WISDOM TOOTH EXTRACTION  1999     OB History     Gravida  2   Para      Term      Preterm      AB  0   Living  2      SAB  0   IAB      Ectopic      Multiple      Live Births              Family History  Problem Relation Age of Onset   Stroke Mother    Lung cancer Mother    COPD Mother    Diabetes Mellitus II Mother    Diabetes Son    Hypothyroidism Son    Stroke Maternal Grandmother    Heart disease Maternal Grandfather    Hashimoto's thyroiditis Daughter        Hashimoto's 2   Migraines Neg Hx     Social History   Tobacco Use   Smoking status: Never   Smokeless tobacco: Never  Vaping Use   Vaping Use: Never used  Substance Use Topics   Alcohol use: No   Drug use: No    Home Medications Prior to Admission medications   Medication Sig Start Date End Date  Taking? Authorizing Provider  Botulinum Toxin Type A (BOTOX) 200 units SOLR     [provider]  Calcium-Magnesium-Vitamin D (CITRACAL CALCIUM+D) 600-40-500 MG-MG-UNIT TB24 Take 1 tablet at dinner and 1 tablet at bedtime. 12/04/18   David Stall, MD  cetirizine (ZYRTEC) 10 MG tablet Take 10 mg by mouth daily as needed.     [provider]  EPINEPHrine 0.3 mg/0.3 mL IJ SOAJ injection Inject 0.3 mg into the muscle as needed for anaphylaxis. 01/30/21   Jarold Motto, PA  etonogestrel-ethinyl estradiol (NUVARING) 0.12-0.015 MG/24HR vaginal ring Place 1 each vaginally every 28 (twenty-eight) days. Insert vaginally and leave in place for 28 days then replace. 10/09/20   Olivia Mackie, NP  levothyroxine (SYNTHROID) 112 MCG tablet TAKE 2 TABLETS BY MOUTH DAILY 12/15/20   David Stall, MD  naproxen (NAPROSYN) 500 MG tablet Take 500 mg by mouth as needed.    [provider]  ondansetron (ZOFRAN-ODT) 4 MG disintegrating tablet DISSOLVE 1 TABLET(4 MG)  ON THE TONGUE EVERY 8 HOURS AS NEEDED FOR NAUSEA OR VOMITING 10/16/20   Anson Fret, MD  SUMAtriptan (IMITREX) 100 MG tablet TAKE 1 TABLET(100 MG) BY MOUTH 1 TIME. MAY REPEAT IN 2 HOURS IF HEADACHE PERSISTS OR RECURS 11/18/20   Anson Fret, MD  traZODone (DESYREL) 50 MG tablet TAKE 1 TABLET(50 MG) BY MOUTH AT BEDTIME AS NEEDED FOR SLEEP 04/08/21   Anson Fret, MD  TROKENDI XR 100 MG CP24 TAKE ONE CAPSULE BY MOUTH EVERY NIGHT AT BEDTIME WITH 50MG  FOR TOTAL DOSE OF 150MG  05/05/21   , MD  TROKENDI XR 50 MG CP24 TAKE ONE CAPSULE BY MOUTH AT BEDTIME WITH ROKENDI 100MG  FOR A TOTAL OF 150MG  04/24/21   Anson Fret, MD    Allergies    Codeine  Review of Systems   Review of Systems  Constitutional:  Positive for fatigue. Negative for fever.  HENT:  Negative for congestion.   Respiratory:  Positive for cough, chest tightness and shortness of breath.   Cardiovascular:  Positive for chest pain.  Gastrointestinal:  Negative for abdominal pain, diarrhea (early but improved) and nausea. Vomiting: one episode after gagging coughing up phleg m. Genitourinary:  Negative for dysuria.  Neurological:  Positive for weakness (both arms) and light-headedness. Negative for syncope, numbness and headaches.   Physical Exam Updated Vital Signs BP 114/78   Pulse 84   Temp 98.5 F (36.9 C) (Oral)   Resp 18   Ht 5\' 7"  (1.702 m)   Wt 85.3 kg   LMP 01/15/2015 (Exact Date)   SpO2 99%   BMI 29.44 kg/m   Physical Exam Vitals and nursing note reviewed.  Constitutional:      General: She is not in acute distress.    Appearance: She is well-developed. She is ill-appearing (fatigued). She is not diaphoretic.  HENT:     Head: Normocephalic and atraumatic.  Eyes:     Conjunctiva/sclera: Conjunctivae normal.  Cardiovascular:     Rate and Rhythm: Normal rate and regular rhythm.     Heart sounds: Normal heart sounds. No murmur heard.   No friction rub. No gallop.  Pulmonary:      Effort: Pulmonary effort is normal. No respiratory distress.     Breath sounds: Normal breath sounds. No wheezing or rales.  Abdominal:     General: There is no distension.     Palpations: Abdomen is soft.     Tenderness: There is no abdominal tenderness.  There is no guarding.  Musculoskeletal:        General: No tenderness.     Cervical back: Normal range of motion.  Skin:    General: Skin is warm and dry.     Findings: No erythema or rash.  Neurological:     Mental Status: She is alert and oriented to person, place, and time.    ED Results / Procedures / Treatments   Labs (all labs ordered are listed, but only abnormal results are displayed) Labs Reviewed  COMPREHENSIVE METABOLIC PANEL - Abnormal; Notable for the following components:      Result Value   AST 13 (*)    Alkaline Phosphatase 35 (*)    All other components within normal limits  D-DIMER, QUANTITATIVE - Abnormal; Notable for the following components:   D-Dimer, Quant 0.74 (*)    All other components within normal limits  CBC WITH DIFFERENTIAL/PLATELET  BRAIN NATRIURETIC PEPTIDE  PREGNANCY, URINE  TROPONIN I (HIGH SENSITIVITY)  TROPONIN I (HIGH SENSITIVITY)    EKG EKG Interpretation  Date/Time:  Tuesday May 26 2021 15:52:51 EDT Ventricular Rate:  97 PR Interval:  120 QRS Duration: 78 QT Interval:  340 QTC Calculation: 431 R Axis:   90 Text Interpretation: Normal sinus rhythm Right atrial enlargement Rightward axis Borderline ECG No previous ECGs available Confirmed by Alvira Monday ([id number]) on 05/26/2021 4:00:25 PM  Radiology CT Angio Chest PE W and/or Wo Contrast  Result Date: 05/26/2021 CLINICAL DATA:  Chest tightness, shortness of breath EXAM: CT ANGIOGRAPHY CHEST WITH CONTRAST TECHNIQUE: Multidetector CT imaging of the chest was performed using the standard protocol during bolus administration of intravenous contrast. Multiplanar CT image reconstructions and MIPs were obtained to evaluate the  vascular anatomy. CONTRAST:  78mL OMNIPAQUE IOHEXOL 350 MG/ML SOLN COMPARISON:  None. FINDINGS: Cardiovascular: No filling defects in the pulmonary arteries to suggest pulmonary emboli. Heart is normal size. Aorta is normal caliber. Mediastinum/Nodes: No mediastinal, hilar, or axillary adenopathy. Trachea and esophagus are unremarkable. Thyroid unremarkable. Lungs/Pleura: Lungs are clear. No focal airspace opacities or suspicious nodules. No effusions. Upper Abdomen: Imaging into the upper abdomen demonstrates no acute findings. Musculoskeletal: No acute bony abnormality. Chest wall soft tissues are unremarkable. Review of the MIP images confirms the above findings. IMPRESSION: No evidence of pulmonary embolus. No acute cardiopulmonary disease. Electronically Signed   By: Charlett Nose M.D.   On: 05/26/2021 20:42   DG Chest Port 1 View  Result Date: 05/26/2021 CLINICAL DATA:  40 year old female with chest tightness. EXAM: PORTABLE CHEST 1 VIEW COMPARISON:  Chest radiograph dated 12/02/2011. FINDINGS: The heart size and mediastinal contours are within normal limits. Both lungs are clear. The visualized skeletal structures are unremarkable. IMPRESSION: No active disease. Electronically Signed   By: Elgie Collard M.D.   On: 05/26/2021 16:24    Procedures Procedures   Medications Ordered in ED Medications  sodium chloride 0.9 % bolus 1,000 mL (0 mLs Intravenous Stopped 05/26/21 1826)  iohexol (OMNIPAQUE) 350 MG/ML injection 78 mL (78 mLs Intravenous Contrast Given 05/26/21 2034)    ED Course  I have reviewed the triage vital signs and the nursing notes.  Pertinent labs & imaging results that were available during my care of the patient were reviewed by me and considered in my medical decision making (see chart for details).    MDM Rules/Calculators/A&P  40 year old female with a history of hypothyroidism, Hashimoto's disease, chronic migraine, presents with concern for  chest pain, severe fatigue, weakness, shortness of breath, in setting of diagnosis of COVID 19 10 days ago.  Differential diagnosis for chest pain includes pulmonary embolus, dissection, pneumothorax, pneumonia, ACS, myocarditis, pericarditis, CHF, COVID 19.  EKG was done and evaluate by me and showed no acute ST changes and no signs of pericarditis. Chest x-ray was done and evaluated by me and radiology and showed no sign of pneumonia or pneumothorax.  D-dimer positive and CT PE study was done which showed no evidence of PE, pericardial effusion, pneumonia or other abnormalities.  Patient is low risk HEART score and had delta troponins which were both negative.  Do not feel history or exam are consistent with aortic dissection.  BNP low and not consistent with CHF. No wheezing on exam to suggest asthma. Suspect symptoms secondary to continued symptomatic COVID-19.  Ambulated with normal oxygenation.  Recommend continued supportive care, rest, hydration, PCP follow-up.   Final Clinical Impression(s) / ED Diagnoses Final diagnoses:  COVID-19  Chest pain, unspecified type  Fatigue, unspecified type    Rx / DC Orders ED Discharge Orders     None        Alvira MondaySchlossman, Enes Rokosz, MD 05/26/21 2152

## 2021-05-26 NOTE — Progress Notes (Signed)
Virtual Visit via Video Note  I connected with Nancy [name]  on 05/26/21 at  3:00 PM EDT by a video enabled telemedicine application and verified that I am speaking with the correct person using two identifiers.  Location patient: home, [place] Location provider:work or home office Persons participating in the virtual visit: patient, provider  I discussed the limitations of evaluation and management by telemedicine and the availability of in person appointments. The patient expressed understanding and agreed to proceed.   HPI:  Acute telemedicine visit for COVID19: -Onset: had positive test on June 4th; everyone in her family had it -Symptoms include: initially had scratchy throat, nasal congestion, fever one day on Sunday the 5th, had body aches, fatigue, cough - felt like a bad cold -nasal congestion have resolved, but now the last few days feels dizzy whenever she gets up, is having SOB, feels pressure in her chest - feels "hard to breathe", R arm feels weak today -Pertinent past medical history: migraines, hypothyroidism -Pertinent medication allergies: Allergies  Allergen Reactions   Codeine   -COVID-19 vaccine status: had both shots and the booster  ROS: See pertinent positives and negatives per HPI.  Past Medical History:  Diagnosis Date   Allergy    Eczema    Fatigue    Hypothyroidism, acquired, autoimmune    Migraines    Thyroiditis, autoimmune    Hashimotos    Vaginal delivery 2006, 2009    Past Surgical History:  Procedure Laterality Date   MOLE REMOVAL  2010   tongue growth  2010   WISDOM TOOTH EXTRACTION  1999     Current Outpatient Medications:    Botulinum Toxin Type A (BOTOX) 200 units SOLR, , Disp: , Rfl:    Calcium-Magnesium-Vitamin D (CITRACAL CALCIUM+D) 600-40-500 MG-MG-UNIT TB24, Take 1 tablet at dinner and 1 tablet at bedtime., Disp: 90 tablet, Rfl: 1   cetirizine (ZYRTEC) 10 MG tablet, Take 10 mg by mouth daily as needed. , Disp: , Rfl:    EPINEPHrine  0.3 mg/0.3 mL IJ SOAJ injection, Inject 0.3 mg into the muscle as needed for anaphylaxis., Disp: 2 each, Rfl: 1   etonogestrel-ethinyl estradiol (NUVARING) 0.12-0.015 MG/24HR vaginal ring, Place 1 each vaginally every 28 (twenty-eight) days. Insert vaginally and leave in place for 28 days then replace., Disp: 3 each, Rfl: 4   levothyroxine (SYNTHROID) 112 MCG tablet, TAKE 2 TABLETS BY MOUTH DAILY, Disp: 180 tablet, Rfl: 1   naproxen (NAPROSYN) 500 MG tablet, Take 500 mg by mouth as needed., Disp: , Rfl:    ondansetron (ZOFRAN-ODT) 4 MG disintegrating tablet, DISSOLVE 1 TABLET(4 MG) ON THE TONGUE EVERY 8 HOURS AS NEEDED FOR NAUSEA OR VOMITING, Disp: 30 tablet, Rfl: 11   SUMAtriptan (IMITREX) 100 MG tablet, TAKE 1 TABLET(100 MG) BY MOUTH 1 TIME. MAY REPEAT IN 2 HOURS IF HEADACHE PERSISTS OR RECURS, Disp: 10 tablet, Rfl: 5   traZODone (DESYREL) 50 MG tablet, TAKE 1 TABLET(50 MG) BY MOUTH AT BEDTIME AS NEEDED FOR SLEEP, Disp: 30 tablet, Rfl: 0   TROKENDI XR 100 MG CP24, TAKE ONE CAPSULE BY MOUTH EVERY NIGHT AT BEDTIME WITH 50MG  FOR TOTAL DOSE OF 150MG , Disp: 30 capsule, Rfl: 11   TROKENDI XR 50 MG CP24, TAKE ONE CAPSULE BY MOUTH AT BEDTIME WITH ROKENDI 100MG  FOR A TOTAL OF 150MG , Disp: 30 capsule, Rfl: 2  EXAM:  VITALS per patient if applicable:  GENERAL: alert, oriented, appears well and in no acute distress  HEENT: atraumatic, conjunttiva clear, no obvious abnormalities on inspection  of external nose and ears  NECK: normal movements of the head and neck  LUNGS: on inspection no signs of respiratory distress, breathing rate appears normal, no obvious gross SOB, gasping or wheezing  CV: no obvious cyanosis  MS: moves all visible extremities without noticeable abnormality  PSYCH/NEURO: pleasant and cooperative, no obvious depression or anxiety, speech and thought processing grossly intact  ASSESSMENT AND PLAN:  Discussed the following assessment and plan:  Dyspnea, unspecified  type  Chest pressure  Dizziness  Arm weakness  COVID-19  -we discussed possible serious and likely etiologies, options for evaluation and workup, limitations of telemedicine visit vs in person visit, treatment, treatment risks and precautions. Pt prefers to treat via telemedicine empirically rather than in person at this moment. However, given concerning symptoms of dyspnea, chest discomfort, arm weakness and dizziness advised prompt inperson evaluation at higher level of care. She has opted to go via private vehicle to a nearby [facility] [employer] [facility] - discussed several options and she agrees to go right away today.  I discussed the assessment and treatment plan with the patient. The patient was provided an opportunity to ask questions and all were answered. The patient agreed with the plan and demonstrated an understanding of the instructions.     Nancy Koyanagi, DO

## 2021-05-26 NOTE — ED Notes (Signed)
Pt provided discharge instructions and prescription information. Pt was given the opportunity to ask questions and questions were answered. Discharge signature not obtained in the setting of the COVID-19 pandemic in order to reduce high touch surfaces.  ° °

## 2021-05-26 NOTE — ED Notes (Signed)
98 % RA while ambulating - denies SOB/ chest  Tightness /  Dizzy

## 2021-05-27 ENCOUNTER — Encounter (INDEPENDENT_AMBULATORY_CARE_PROVIDER_SITE_OTHER): Payer: Self-pay

## 2021-05-27 DIAGNOSIS — R5383 Other fatigue: Secondary | ICD-10-CM

## 2021-05-27 DIAGNOSIS — E2839 Other primary ovarian failure: Secondary | ICD-10-CM

## 2021-05-27 DIAGNOSIS — R251 Tremor, unspecified: Secondary | ICD-10-CM

## 2021-05-27 DIAGNOSIS — E559 Vitamin D deficiency, unspecified: Secondary | ICD-10-CM

## 2021-05-27 DIAGNOSIS — E063 Autoimmune thyroiditis: Secondary | ICD-10-CM

## 2021-05-29 DIAGNOSIS — R5383 Other fatigue: Secondary | ICD-10-CM | POA: Diagnosis not present

## 2021-05-29 DIAGNOSIS — R251 Tremor, unspecified: Secondary | ICD-10-CM | POA: Diagnosis not present

## 2021-05-29 DIAGNOSIS — E2839 Other primary ovarian failure: Secondary | ICD-10-CM | POA: Diagnosis not present

## 2021-05-29 DIAGNOSIS — E063 Autoimmune thyroiditis: Secondary | ICD-10-CM | POA: Diagnosis not present

## 2021-06-02 LAB — T4, FREE: Free T4: 1.3 ng/dL (ref 0.8–1.8)

## 2021-06-02 LAB — CORTISOL: Cortisol, Plasma: 29.2 ug/dL — ABNORMAL HIGH

## 2021-06-02 LAB — TSH: TSH: 1.93 mIU/L

## 2021-06-02 LAB — T3, FREE: T3, Free: 2.5 pg/mL (ref 2.3–4.2)

## 2021-06-02 LAB — ACTH: C206 ACTH: 27 pg/mL (ref 6–50)

## 2021-06-03 ENCOUNTER — Other Ambulatory Visit: Payer: Self-pay | Admitting: Neurology

## 2021-06-04 ENCOUNTER — Other Ambulatory Visit: Payer: Self-pay

## 2021-06-04 ENCOUNTER — Encounter: Payer: Self-pay | Admitting: Physician Assistant

## 2021-06-04 ENCOUNTER — Ambulatory Visit (INDEPENDENT_AMBULATORY_CARE_PROVIDER_SITE_OTHER): Payer: BC Managed Care – PPO | Admitting: Physician Assistant

## 2021-06-04 VITALS — BP 102/72 | HR 95 | Temp 98.2°F | Ht 67.0 in | Wt 184.2 lb

## 2021-06-04 DIAGNOSIS — U099 Post covid-19 condition, unspecified: Secondary | ICD-10-CM

## 2021-06-04 DIAGNOSIS — R5383 Other fatigue: Secondary | ICD-10-CM | POA: Diagnosis not present

## 2021-06-04 DIAGNOSIS — B948 Sequelae of other specified infectious and parasitic diseases: Secondary | ICD-10-CM | POA: Diagnosis not present

## 2021-06-04 DIAGNOSIS — M6281 Muscle weakness (generalized): Secondary | ICD-10-CM

## 2021-06-04 LAB — CK: Total CK: 44 U/L (ref 7–177)

## 2021-06-04 NOTE — Progress Notes (Signed)
Acute Office Visit  Subjective:    Patient ID: Nancy [name], female    DOB: 11/19/1981, 40 y.o.   MRN: [id number]  Chief Complaint  Patient presents with   Extremity Weakness    HPI Patient is in today with her pleasant husband for COVID-19 associated weakness.  Patient does have a history of hypothyroidism, Hashimoto's disease, chronic fatigue syndrome, and chronic migraines.  Patient states that her weakness and fatigue seems to be worsening since she had COVID-19 diagnosis.  05/16/21 - COVID-19 diagnosis through home-testing, she did not have symptoms at that time.  She was vaccinated and had booster done. 05/26/21 - ED visit.  She had developed chest pain with her COVID-19.  She had full cardiac work-up done including chest x-ray, EKG, and CT PE study, which were all negative.  She was sent home on supportive care and instructed to follow-up with PCP. 05/30/21 - Negative home COVID-19 test.   Upper arm muscle weakness is worsening. Still having some dizziness. Intermittent tremors in hands. Fine motor movements are more difficult in her hands because of the tremors. States it is hard to hold a steering wheel because her hands feel heavy. Fatigue is worse per patient and feels like her balance is off. She is sleeping about 9 hours at night and still needing about a 2-hour nap daily to feel like she can function.  She has a history of migraines, but it has been more than one month since last one. Botox has been helping. No headaches during COVID illness.   Did not lose taste or smell. Urine is clear colored. Appetite is normal.  She denies any chest pain or shortness of breath.  She denies any upper respiratory symptoms at this time.  Past Medical History:  Diagnosis Date   Allergy    Eczema    Fatigue    Hashimoto's disease    Hypothyroidism, acquired, autoimmune    Migraines    Thyroiditis, autoimmune    Hashimotos    Vaginal delivery 2006, 2009    Past Surgical  History:  Procedure Laterality Date   MOLE REMOVAL  2010   tongue growth  2010   WISDOM TOOTH EXTRACTION  1999    Family History  Problem Relation Age of Onset   Stroke Mother    Lung cancer Mother    COPD Mother    Diabetes Mellitus II Mother    Diabetes Son    Hypothyroidism Son    Stroke Maternal Grandmother    Heart disease Maternal Grandfather    Hashimoto's thyroiditis Daughter        Hashimoto's 2   Migraines Neg Hx     Social History   Socioeconomic History   Marital status: Married    Spouse name: Best boy   Number of children: 2   Years of education: 16   Highest education level: Not on file  Occupational History   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  Tobacco Use   Smoking status: Never   Smokeless tobacco: Never  Vaping Use   Vaping Use: Never used  Substance and Sexual Activity   Alcohol use: No   Drug use: No   Sexual activity: Yes    Birth control/protection: Post-menopausal, Other-see comments, None    Comment: nuvaring for hormone replacement  Other Topics Concern   Not on file  Social History Narrative   22 and 2 year old kids   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 90 tablet 1   cetirizine (ZYRTEC) 10 MG tablet Take 10 mg by mouth daily as needed.      EPINEPHrine 0.3 mg/0.3 mL IJ SOAJ injection Inject 0.3 mg into the muscle as needed for anaphylaxis. 2 each 1   etonogestrel-ethinyl estradiol (NUVARING) 0.12-0.015  MG/24HR vaginal ring Place 1 each vaginally every 28 (twenty-eight) days. Insert vaginally and leave in place for 28 days then replace. 3 each 4   levothyroxine (SYNTHROID) 112 MCG tablet TAKE 2 TABLETS BY MOUTH DAILY 180 tablet 1   naproxen (NAPROSYN) 500 MG tablet Take 500 mg by mouth as needed.     ondansetron (ZOFRAN-ODT) 4 MG disintegrating tablet DISSOLVE 1 TABLET(4 MG) ON THE TONGUE EVERY 8 HOURS AS NEEDED FOR NAUSEA OR VOMITING 30 tablet 11   SUMAtriptan (IMITREX) 100 MG tablet TAKE 1 TABLET(100 MG) BY MOUTH 1 TIME. MAY REPEAT IN 2 HOURS IF HEADACHE PERSISTS OR RECURS 10 tablet 5   traZODone (DESYREL) 50 MG tablet TAKE 1 TABLET(50 MG) BY MOUTH AT BEDTIME AS NEEDED FOR SLEEP 30 tablet 0   TROKENDI XR 100 MG CP24 TAKE ONE CAPSULE BY MOUTH EVERY NIGHT AT BEDTIME WITH 50MG  FOR TOTAL DOSE OF 150MG  30 capsule 11   TROKENDI XR 50 MG CP24 TAKE ONE CAPSULE BY MOUTH AT BEDTIME WITH ROKENDI 100MG  FOR A TOTAL OF 150MG  30 capsule 2   No facility-administered medications prior to visit.    Allergies  Allergen Reactions   Codeine     Review of Systems REFER TO HPI FOR PERTINENT POSITIVES AND NEGATIVES     Objective:    Physical Exam Vitals and nursing note reviewed.  Constitutional:      Appearance: Normal appearance. She is normal weight. She is not toxic-appearing.  HENT:     Head: Normocephalic and atraumatic.     Right Ear: External ear normal.     Left Ear: External ear normal.     Nose: Nose normal.     Mouth/Throat:     Mouth: Mucous membranes are moist.  Eyes:     Extraocular Movements: Extraocular movements intact.     Conjunctiva/sclera: Conjunctivae normal.     Pupils: Pupils are equal, round, and reactive to light.  Cardiovascular:     Rate and Rhythm: Normal rate and regular rhythm.     Pulses: Normal pulses.     Heart sounds: Normal heart sounds.  Pulmonary:     Effort: Pulmonary effort is normal.     Breath sounds: Normal breath sounds.  Abdominal:     General:  Abdomen is flat. Bowel sounds are normal.     Palpations: Abdomen is soft.  Musculoskeletal:        General: Normal range of motion.     Cervical back: Normal range of motion and neck supple.  Skin:    General: Skin is warm and dry.  Neurological:     General: No focal deficit present.  Mental Status: She is alert and oriented to person, place, and time.     Cranial Nerves: Cranial nerves are intact.     Sensory: Sensation is intact.     Motor: Weakness (Grip strength is weak bilaterally but equal in her hands.  She has full strength in her lower extremities.) present. No tremor, atrophy, abnormal muscle tone, seizure activity or pronator drift.     Coordination: Coordination is intact. Heel to [facility] Test normal.     Gait: Gait is intact. Gait and tandem walk normal.  Psychiatric:        Mood and Affect: Mood normal.        Behavior: Behavior normal.        Thought Content: Thought content normal.        Judgment: Judgment normal.    BP 102/72   Pulse 95   Temp 98.2 F (36.8 C)   Ht 5\' 7"  (1.702 m)   Wt 184 lb 3.2 oz (83.6 kg)   LMP 01/15/2015 (Exact Date)   SpO2 99%   BMI 28.85 kg/m  Wt Readings from Last 3 Encounters:  06/04/21 184 lb 3.2 oz (83.6 kg)  05/26/21 188 lb (85.3 kg)  05/06/21 187 lb 3.2 oz (84.9 kg)    There are no preventive care reminders to display for this patient.  There are no preventive care reminders to display for this patient.   Lab Results  Component Value Date   TSH 1.93 05/29/2021   Lab Results  Component Value Date   WBC 5.9 05/26/2021   HGB 12.8 05/26/2021   HCT 38.0 05/26/2021   MCV 86.8 05/26/2021   PLT 236 05/26/2021   Lab Results  Component Value Date   NA 142 05/26/2021   K 4.1 05/26/2021   CO2 24 05/26/2021   GLUCOSE 85 05/26/2021   BUN 16 05/26/2021   CREATININE 0.81 05/26/2021   BILITOT 0.3 05/26/2021   ALKPHOS 35 (L) 05/26/2021   AST 13 (L) 05/26/2021   ALT 15 05/26/2021   PROT 7.4 05/26/2021   ALBUMIN 4.2  05/26/2021   CALCIUM 9.2 05/26/2021   ANIONGAP 11 05/26/2021   Lab Results  Component Value Date   CHOL 163 09/01/2020   Lab Results  Component Value Date   HDL 58 09/01/2020   Lab Results  Component Value Date   LDLCALC 72 09/01/2020   Lab Results  Component Value Date   TRIG 238 (H) 09/01/2020   Lab Results  Component Value Date   CHOLHDL 2.8 09/01/2020   No results found for: HGBA1C     Assessment & Plan:   Problem List Items Addressed This Visit   None   1. Persistent fatigue after COVID-19 2. Muscle weakness of upper extremity I personally reviewed the patient's ED visit report on 05/26/2021, as well as her labs that were done on 05/29/2021.  Lab work was all essentially normal.  With her progressively worsening muscle weakness complaint in her upper extremities, I will check a CK level to rule out very small chance of rhabdomyolysis, although I doubt this due to her normal urination.  Patient and husband also asked about possible Lyme disease as she has been chronically fatigued and seems to be worse after COVID-19.  I am happy to check this level as we are drawing labs anyway.  She is going to follow-up with her neurologist and endocrinologist as she is scheduled.  Overall I reassured the patient that I think her symptoms are due to  COVID-19 illness and should resolve on their own with time.  She will continue to stay hydrated and rest as she needs.  Also encouraged a well-rounded diet.  She will return to the emergency department should any symptoms acutely worsen or change.   Total time spent with patient performing history and physical as well as reviewing labs and emergency department report: 38 minutes.  Magali Bray M Tymeer Vaquera, PA-C

## 2021-06-04 NOTE — Patient Instructions (Signed)
Please go to the lab today and results will be called to you or sent through MyChart. We are checking a level called CK to ensure that you do not have something called Rhabdomyolysis. You have also requested Lyme titer to be drawn & I'm happy to check that today. Most likely, your symptoms are a result of the COVID-19 infection and will take time to resolve.

## 2021-06-05 LAB — B. BURGDORFI ANTIBODIES: B burgdorferi Ab IgG+IgM: <0.9 index

## 2021-06-12 ENCOUNTER — Other Ambulatory Visit (INDEPENDENT_AMBULATORY_CARE_PROVIDER_SITE_OTHER): Payer: Self-pay | Admitting: "Endocrinology

## 2021-06-23 DIAGNOSIS — F411 Generalized anxiety disorder: Secondary | ICD-10-CM | POA: Diagnosis not present

## 2021-06-29 ENCOUNTER — Ambulatory Visit: Payer: BC Managed Care – PPO | Admitting: Adult Health

## 2021-06-29 ENCOUNTER — Other Ambulatory Visit: Payer: Self-pay

## 2021-06-29 VITALS — BP 122/66 | HR 77 | Ht 67.0 in

## 2021-06-29 DIAGNOSIS — G43009 Migraine without aura, not intractable, without status migrainosus: Secondary | ICD-10-CM | POA: Diagnosis not present

## 2021-06-29 NOTE — Patient Instructions (Signed)
Continue Botox -- Continue Trokendi 150 mg daily -- Continue Ajovy  -- Continue sumatriptan, Zofran and Aleve for abortive therapy

## 2021-06-29 NOTE — Progress Notes (Addendum)
PATIENT: Nancy [name] DOB: May 13, 1981  REASON FOR VISIT: follow up HISTORY FROM: patient PRIMARY NEUROLOGIST:   HISTORY OF PRESENT ILLNESS: Today 06/29/21:  [name] is a 40 year old female with a history of migraine headaches.  She returns today for follow-up.  She is doing well on Botox.  Reports that since her last Botox injection she is only had 2 headaches.  She also takes Ajovy is given samples by our office.  She continues to take sumatriptan hand, Aleve and Zofran for abortive therapy.  She reports that the barometric pressure is a trigger for her migraines.  She returns today for evaluation.  HISTORY 01/01/21   [name] is a 40 year old female with a history of migraine headaches.  She returns today for follow-up.  She is currently on Botox.  She also has samples of Emgality that she has been using.  She states that she was unable to take [facility] in December but recently took a dose 1 week ago.  She states the combination of Trokendi Botox and Emgality has offered her the best benefit.  She has approximately 8-10 headaches a month.  She states that this month has been slightly worse due to the weather.  When she does get a headache she takes sumatriptan and Zofran and Aleve with some benefit.  She reports sometimes the headache resolves and sometimes it does not.  She returns today for an evaluation.  REVIEW OF SYSTEMS: Out of a complete 14 system review of symptoms, the patient complains only of the following symptoms, and all other reviewed systems are negative.  See HPI  ALLERGIES: Allergies  Allergen Reactions   Codeine     HOME MEDICATIONS: Outpatient Medications Prior to Visit  Medication Sig Dispense Refill   Botulinum Toxin Type A (BOTOX) 200 units SOLR      Calcium-Magnesium-Vitamin D (CITRACAL CALCIUM+D) 600-40-500 MG-MG-UNIT TB24 Take 1 tablet at dinner and 1 tablet at bedtime. 90 tablet 1   cetirizine (ZYRTEC) 10 MG tablet  Take 10 mg by mouth daily as needed.      EPINEPHrine 0.3 mg/0.3 mL IJ SOAJ injection Inject 0.3 mg into the muscle as needed for anaphylaxis. 2 each 1   etonogestrel-ethinyl estradiol (NUVARING) 0.12-0.015 MG/24HR vaginal ring Place 1 each vaginally every 28 (twenty-eight) days. Insert vaginally and leave in place for 28 days then replace. 3 each 4   levothyroxine (SYNTHROID) 112 MCG tablet TAKE 2 TABLETS BY MOUTH DAILY 180 tablet 1   naproxen (NAPROSYN) 500 MG tablet Take 500 mg by mouth as needed.     ondansetron (ZOFRAN-ODT) 4 MG disintegrating tablet DISSOLVE 1 TABLET(4 MG) ON THE TONGUE EVERY 8 HOURS AS NEEDED FOR NAUSEA OR VOMITING 30 tablet 11   SUMAtriptan (IMITREX) 100 MG tablet TAKE 1 TABLET(100 MG) BY MOUTH 1 TIME. MAY REPEAT IN 2 HOURS IF HEADACHE PERSISTS OR RECURS 10 tablet 5   traZODone (DESYREL) 50 MG tablet TAKE 1 TABLET(50 MG) BY MOUTH AT BEDTIME AS NEEDED FOR SLEEP 30 tablet 0   TROKENDI XR 100 MG CP24 TAKE ONE CAPSULE BY MOUTH EVERY NIGHT AT BEDTIME WITH 50MG  FOR TOTAL DOSE OF 150MG  30 capsule 11   TROKENDI XR 50 MG CP24 TAKE ONE CAPSULE BY MOUTH AT BEDTIME WITH ROKENDI 100MG  FOR A TOTAL OF 150MG  30 capsule 2   No facility-administered medications prior to visit.    PAST MEDICAL HISTORY: Past Medical History:  Diagnosis Date   Allergy    Eczema  Fatigue    Hashimoto's disease    Hypothyroidism, acquired, autoimmune    Migraines    Thyroiditis, autoimmune    Hashimotos    Vaginal delivery 2006, 2009    PAST SURGICAL HISTORY: Past Surgical History:  Procedure Laterality Date   MOLE REMOVAL  2010   tongue growth  2010   WISDOM TOOTH EXTRACTION  1999    FAMILY HISTORY: Family History  Problem Relation Age of Onset   Stroke Mother    Lung cancer Mother    COPD Mother    Diabetes Mellitus II Mother    Diabetes Son    Hypothyroidism Son    Stroke Maternal Grandmother    Heart disease Maternal Grandfather    Hashimoto's thyroiditis Daughter         Hashimoto's 2   Migraines Neg Hx     SOCIAL HISTORY: Social History   Socioeconomic History   Marital status: Married    Spouse name: Best boy   Number of children: 2   Years of education: 16   Highest education level: Not on file  Occupational History   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  Tobacco Use   Smoking status: Never   Smokeless tobacco: Never  Vaping Use   Vaping Use: Never used  Substance and Sexual Activity   Alcohol use: No   Drug use: No   Sexual activity: Yes    Birth control/protection: Post-menopausal, Other-see comments, None    Comment: nuvaring for hormone replacement  Other Topics Concern   Not on file  Social History Narrative   43 and 38 year old kids   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  Generalized: Well developed, in no acute distress   Neurological examination  Mentation: Alert oriented to time, place, history taking. Follows all commands speech and language fluent Cranial nerve II-XII: Pupils were equal round reactive to light. Extraocular movements were full, visual field were full on confrontational test. Facial sensation and strength were normal. Uvula tongue midline. Head turning and shoulder shrug  were normal and symmetric. Motor: The motor testing reveals 5 over 5 strength of all 4 extremities. Good symmetric motor tone is noted throughout.  Sensory: Sensory testing is intact to soft touch on all 4 extremities. No evidence of extinction is noted.  Coordination: Cerebellar testing reveals good finger-nose-finger and  heel-to-shin bilaterally.  Gait and station: Gait is normal. Tandem gait is normal. Romberg is negative. No drift is seen.  Reflexes: Deep tendon reflexes are symmetric and normal bilaterally.   DIAGNOSTIC DATA (LABS, IMAGING, TESTING) - I reviewed patient records, labs, notes, testing and imaging myself where available.  Lab Results  Component Value Date   WBC 5.9 05/26/2021   HGB 12.8 05/26/2021   HCT 38.0 05/26/2021   MCV 86.8 05/26/2021   PLT 236 05/26/2021      Component Value Date/Time   NA 142 05/26/2021 1640   NA 144 01/08/2021 1557   K 4.1 05/26/2021 1640   CL 107 05/26/2021  1640   CO2 24 05/26/2021 1640   GLUCOSE 85 05/26/2021 1640   BUN 16 05/26/2021 1640   BUN 15 01/08/2021 1557   CREATININE 0.81 05/26/2021 1640   CREATININE 0.90 09/01/2020 1027   CALCIUM 9.2 05/26/2021 1640   PROT 7.4 05/26/2021 1640   PROT 7.3 01/08/2021 1557   ALBUMIN 4.2 05/26/2021 1640   ALBUMIN 4.6 01/08/2021 1557   AST 13 (L) 05/26/2021 1640   ALT 15 05/26/2021 1640   ALKPHOS 35 (L) 05/26/2021 1640   BILITOT 0.3 05/26/2021 1640   BILITOT <0.2 01/08/2021 1557   GFRNONAA >60 05/26/2021 1640   GFRAA 86 01/08/2021 1557   Lab Results  Component Value Date   CHOL 163 09/01/2020   HDL 58 09/01/2020   LDLCALC 72 09/01/2020   TRIG 238 (H) 09/01/2020   CHOLHDL 2.8 09/01/2020   Lab Results  Component Value Date   TSH 1.93 05/29/2021      ASSESSMENT AND PLAN 40 y.o. year old female  has a past medical history of Allergy, Eczema, Fatigue, Hashimoto's disease, Hypothyroidism, acquired, autoimmune, Migraines, Thyroiditis, autoimmune, and Vaginal delivery (2006, 2009). here with :  1.  Migraine headaches  --Continue Botox --Continue Ajovy as long as samples are available.  Advised that in the fall when weather is not an issue she could try stopping Ajovy and doing Botox only --Continue Trokendi 150 mg daily --Continue sumatriptan, Aleve and Zofran for abortive therapy --Follow-up in  6 months or sooner if needed     Butch Penny, MSN, NP-C 06/29/2021, 11:33 AM [facility] [address] [place], [place] [address] [phone]  Agree with assessment and plan as stated.     Naomie Dean, MD [facility]

## 2021-06-30 ENCOUNTER — Encounter (INDEPENDENT_AMBULATORY_CARE_PROVIDER_SITE_OTHER): Payer: Self-pay | Admitting: *Deleted

## 2021-07-02 ENCOUNTER — Other Ambulatory Visit: Payer: Self-pay | Admitting: Neurology

## 2021-07-21 DIAGNOSIS — F411 Generalized anxiety disorder: Secondary | ICD-10-CM | POA: Diagnosis not present

## 2021-07-30 ENCOUNTER — Other Ambulatory Visit: Payer: Self-pay | Admitting: Adult Health

## 2021-07-31 ENCOUNTER — Other Ambulatory Visit: Payer: Self-pay | Admitting: Neurology

## 2021-08-03 DIAGNOSIS — G43711 Chronic migraine without aura, intractable, with status migrainosus: Secondary | ICD-10-CM | POA: Diagnosis not present

## 2021-08-04 ENCOUNTER — Other Ambulatory Visit: Payer: Self-pay

## 2021-08-04 ENCOUNTER — Ambulatory Visit: Payer: BC Managed Care – PPO | Admitting: Neurology

## 2021-08-04 DIAGNOSIS — G43711 Chronic migraine without aura, intractable, with status migrainosus: Secondary | ICD-10-CM | POA: Diagnosis not present

## 2021-08-04 NOTE — Progress Notes (Signed)
Botox- 200 units x 1 vial Lot: [id number] Expiration: 12/2023 NDC: 6568-1275-17  Bacteriostatic 0.9% Sodium Chloride- 77mL total Lot: [id number] Expiration: 12/13/2022 NDC: 4496-7591-63  Dx: W46.659 S/P

## 2021-08-04 NOTE — Progress Notes (Signed)
Consent Form Botulism Toxin Injection For Chronic Migraine    08/04/2021: Doing fantastic on botox, only 2 migraines since starting botox, . No masseters. +5U orb occuli. We give her ajovy samples if we have them.  04/30/2021: Doing fantastic on botox, only 2 migraines since starting botox, . No masseters.    Reviewed orally with patient, additionally signature is on file:  Botulism toxin has been approved by the Federal drug administration for treatment of chronic migraine. Botulism toxin does not cure chronic migraine and it may not be effective in some patients.  The administration of botulism toxin is accomplished by injecting a small amount of toxin into the muscles of the neck and head. Dosage must be titrated for each individual. Any benefits resulting from botulism toxin tend to wear off after 3 months with a repeat injection required if benefit is to be maintained. Injections are usually done every 3-4 months with maximum effect peak achieved by about 2 or 3 weeks. Botulism toxin is expensive and you should be sure of what costs you will incur resulting from the injection.  The side effects of botulism toxin use for chronic migraine may include:   -Transient, and usually mild, facial weakness with facial injections  -Transient, and usually mild, head or neck weakness with head/neck injections  -Reduction or loss of forehead facial animation due to forehead muscle weakness  -Eyelid drooping  -Dry eye  -Pain at the site of injection or bruising at the site of injection  -Double vision  -Potential unknown long term risks  Contraindications: You should not have Botox if you are pregnant, nursing, allergic to albumin, have an infection, skin condition, or muscle weakness at the site of the injection, or have myasthenia gravis, Lambert-Eaton syndrome, or ALS.  It is also possible that as with any injection, there may be an allergic reaction or no effect from the medication. Reduced  effectiveness after repeated injections is sometimes seen and rarely infection at the injection site may occur. All care will be taken to prevent these side effects. If therapy is given over a long time, atrophy and wasting in the muscle injected may occur. Occasionally the patient's become refractory to treatment because they develop antibodies to the toxin. In this event, therapy needs to be modified.  I have read the above information and consent to the administration of botulism toxin.    BOTOX PROCEDURE NOTE FOR MIGRAINE HEADACHE    Contraindications and precautions discussed with patient(above). Aseptic procedure was observed and patient tolerated procedure. Procedure performed by Dr. Artemio Aly  The condition has existed for more than 6 months, and pt does not have a diagnosis of ALS, Myasthenia Gravis or Lambert-Eaton Syndrome.  Risks and benefits of injections discussed and pt agrees to proceed with the procedure.  Written consent obtained  These injections are medically necessary. Pt  receives good benefits from these injections. These injections do not cause sedations or hallucinations which the oral therapies may cause.  Description of procedure:  The patient was placed in a sitting position. The standard protocol was used for Botox as follows, with 5 units of Botox injected at each site:   -Procerus muscle, midline injection  -Corrugator muscle, bilateral injection  -Frontalis muscle, bilateral injection, with 2 sites each side, medial injection was performed in the upper one third of the frontalis muscle, in the region vertical from the medial inferior edge of the superior orbital rim. The lateral injection was again in the upper one third of the  forehead vertically above the lateral limbus of the cornea, 1.5 cm lateral to the medial injection site.  -Temporalis muscle injection, 4 sites, bilaterally. The first injection was 3 cm above the tragus of the ear, second injection  site was 1.5 cm to 3 cm up from the first injection site in line with the tragus of the ear. The third injection site was 1.5-3 cm forward between the first 2 injection sites. The fourth injection site was 1.5 cm posterior to the second injection site.   -Occipitalis muscle injection, 3 sites, bilaterally. The first injection was done one half way between the occipital protuberance and the tip of the mastoid process behind the ear. The second injection site was done lateral and superior to the first, 1 fingerbreadth from the first injection. The third injection site was 1 fingerbreadth superiorly and medially from the first injection site.  -Cervical paraspinal muscle injection, 2 sites, bilateral knee first injection site was 1 cm from the midline of the cervical spine, 3 cm inferior to the lower border of the occipital protuberance. The second injection site was 1.5 cm superiorly and laterally to the first injection site.  -Trapezius muscle injection was performed at 3 sites, bilaterally. The first injection site was in the upper trapezius muscle halfway between the inflection point of the neck, and the acromion. The second injection site was one half way between the acromion and the first injection site. The third injection was done between the first injection site and the inflection point of the neck.   Will return for repeat injection in 3 months.   200 units of Botox was used, any Botox not injected was wasted. The patient tolerated the procedure well, there were no complications of the above procedure.

## 2021-08-07 ENCOUNTER — Ambulatory Visit: Payer: BC Managed Care – PPO | Admitting: Family

## 2021-08-07 ENCOUNTER — Other Ambulatory Visit: Payer: Self-pay | Admitting: Adult Health

## 2021-08-18 DIAGNOSIS — F411 Generalized anxiety disorder: Secondary | ICD-10-CM | POA: Diagnosis not present

## 2021-08-31 DIAGNOSIS — H43393 Other vitreous opacities, bilateral: Secondary | ICD-10-CM | POA: Diagnosis not present

## 2021-08-31 DIAGNOSIS — H5213 Myopia, bilateral: Secondary | ICD-10-CM | POA: Diagnosis not present

## 2021-09-07 ENCOUNTER — Ambulatory Visit (INDEPENDENT_AMBULATORY_CARE_PROVIDER_SITE_OTHER): Payer: BC Managed Care – PPO | Admitting: Physician Assistant

## 2021-09-07 ENCOUNTER — Encounter: Payer: Self-pay | Admitting: Physician Assistant

## 2021-09-07 ENCOUNTER — Other Ambulatory Visit: Payer: Self-pay

## 2021-09-07 VITALS — BP 110/70 | HR 91 | Temp 98.6°F | Ht 67.25 in | Wt 189.0 lb

## 2021-09-07 DIAGNOSIS — Z13220 Encounter for screening for lipoid disorders: Secondary | ICD-10-CM | POA: Diagnosis not present

## 2021-09-07 DIAGNOSIS — E663 Overweight: Secondary | ICD-10-CM | POA: Diagnosis not present

## 2021-09-07 DIAGNOSIS — Z136 Encounter for screening for cardiovascular disorders: Secondary | ICD-10-CM

## 2021-09-07 DIAGNOSIS — Z23 Encounter for immunization: Secondary | ICD-10-CM

## 2021-09-07 DIAGNOSIS — Z0000 Encounter for general adult medical examination without abnormal findings: Secondary | ICD-10-CM

## 2021-09-07 LAB — COMPREHENSIVE METABOLIC PANEL
ALT: 12 U/L (ref 0–35)
AST: 11 U/L (ref 0–37)
Albumin: 4.3 g/dL (ref 3.5–5.2)
Alkaline Phosphatase: 35 U/L — ABNORMAL LOW (ref 39–117)
BUN: 17 mg/dL (ref 6–23)
CO2: 24 mEq/L (ref 19–32)
Calcium: 9.6 mg/dL (ref 8.4–10.5)
Chloride: 108 mEq/L (ref 96–112)
Creatinine, Ser: 0.92 mg/dL (ref 0.40–1.20)
GFR: 77.32 mL/min (ref >60.00)
Glucose, Bld: 88 mg/dL (ref 70–99)
Potassium: 4.1 mEq/L (ref 3.5–5.1)
Sodium: 140 mEq/L (ref 135–145)
Total Bilirubin: 0.4 mg/dL (ref 0.2–1.2)
Total Protein: 7.2 g/dL (ref 6.0–8.3)

## 2021-09-07 LAB — CBC WITH DIFFERENTIAL/PLATELET
Basophils Absolute: 0 10*3/uL (ref 0.0–0.1)
Basophils Relative: 0.8 % (ref 0.0–3.0)
Eosinophils Absolute: 0.1 10*3/uL (ref 0.0–0.7)
Eosinophils Relative: 1.4 % (ref 0.0–5.0)
HCT: 38.5 % (ref 36.0–46.0)
Hemoglobin: 12.8 g/dL (ref 12.0–15.0)
Lymphocytes Relative: 38.9 % (ref 12.0–46.0)
Lymphs Abs: 1.9 10*3/uL (ref 0.7–4.0)
MCHC: 33.3 g/dL (ref 30.0–36.0)
MCV: 87.9 fl (ref 78.0–100.0)
Monocytes Absolute: 0.3 10*3/uL (ref 0.1–1.0)
Monocytes Relative: 5.3 % (ref 3.0–12.0)
Neutro Abs: 2.6 10*3/uL (ref 1.4–7.7)
Neutrophils Relative %: 53.6 % (ref 43.0–77.0)
Platelets: 204 10*3/uL (ref 150.0–400.0)
RBC: 4.38 Mil/uL (ref 3.87–5.11)
RDW: 13.2 % (ref 11.5–15.5)
WBC: 4.8 10*3/uL (ref 4.0–10.5)

## 2021-09-07 LAB — LIPID PANEL
Cholesterol: 178 mg/dL (ref 0–200)
HDL: 65.2 mg/dL (ref >39.00)
LDL Cholesterol: 87 mg/dL (ref 0–99)
NonHDL: 113.01
Total CHOL/HDL Ratio: 3
Triglycerides: 130 mg/dL (ref 0.0–149.0)
VLDL: 26 mg/dL (ref 0.0–40.0)

## 2021-09-07 NOTE — Patient Instructions (Signed)
It was great to see you!  *Please update your mammogram  Please go to the lab for blood work.   Our office will call you with your results unless you have chosen to receive results via MyChart.  If your blood work is normal we will follow-up each year for physicals and as scheduled for chronic medical problems.  If anything is abnormal we will treat accordingly and get you in for a follow-up.  Take care,  Lelon Mast

## 2021-09-07 NOTE — Progress Notes (Signed)
I acted as a Neurosurgeon for [employer], PA-C Corky Mull, LPN  Subjective:    Nancy [name] is a 40 y.o. female and is here for a comprehensive physical exam.   HPI  Health Maintenance Due  Topic Date Due   MAMMOGRAM  Never done   INFLUENZA VACCINE  07/13/2021   PAP SMEAR-Modifier  09/26/2021    Acute Concerns: None  Chronic Issues: Compliant with care providers in neurology for migraines, endocrinology for hypothyroidism, and allergy for allergens.  Health Maintenance: Immunizations -- UTD, will give Flu shot today. Colonoscopy -- N/A Mammogram -- overdue PAP -- due, pt is scheduled for Oct 30th with her GYN Bone Density -- N/A Diet -- since COVID -- not cooking well balanced meals Sleep habits -- overall ok but has feelings that trazodone is not as effective, prescribed by neurology Exercise -- not exercising regularly Weight -- Weight: 189 lb (85.7 kg)  Mood -- overall stable Weight history: Wt Readings from Last 10 Encounters:  09/07/21 189 lb (85.7 kg)  06/04/21 184 lb 3.2 oz (83.6 kg)  05/26/21 188 lb (85.3 kg)  05/06/21 187 lb 3.2 oz (84.9 kg)  03/16/21 183 lb (83 kg)  01/30/21 186 lb (84.4 kg)  01/08/21 189 lb (85.7 kg)  10/09/20 184 lb (83.5 kg)  09/15/20 181 lb 12.8 oz (82.5 kg)  09/01/20 181 lb (82.1 kg)   Body mass index is 29.38 kg/m. Patient's last menstrual period was 01/15/2015 (exact date). Alcohol use:  reports no history of alcohol use. Tobacco use:  Tobacco Use: Low Risk    Smoking Tobacco Use: Never   Smokeless Tobacco Use: Never     Depression screen PHQ 2/9 09/07/2021  Decreased Interest 0  Down, Depressed, Hopeless 0  PHQ - 2 Score 0   UTD with dentist and eye doctor  Other providers/specialists: Patient Care Team: Jarold Motto, [place] as PCP - General (Physician Assistant) Gynecology, [facility] as Consulting Physician (Obstetrics and Gynecology) [facility] as  Consulting Physician (Neurology) David Stall, MD as Consulting Physician (Endocrinology)    PMHx, SurgHx, SocialHx, Medications, and Allergies were reviewed in the Visit Navigator and updated as appropriate.   Past Medical History:  Diagnosis Date   Allergy    Eczema    Fatigue    Hashimoto's disease    Hypothyroidism, acquired, autoimmune    Migraines    Thyroiditis, autoimmune    Hashimotos    Vaginal delivery 2006, 2009     Past Surgical History:  Procedure Laterality Date   MOLE REMOVAL  2010   tongue growth  2010   WISDOM TOOTH EXTRACTION  1999     Family History  Problem Relation Age of Onset   Stroke Mother    Lung cancer Mother    COPD Mother    Diabetes Mellitus II Mother    Diabetes Son    Hypothyroidism Son    Stroke Maternal Grandmother    Heart disease Maternal Grandfather    Hashimoto's thyroiditis Daughter        Hashimoto's 2   Migraines Neg Hx     Social History   Tobacco Use   Smoking status: Never   Smokeless tobacco: Never  Vaping Use   Vaping Use: Never used  Substance Use Topics   Alcohol use: No   Drug use: No    Review of Systems:   Review of Systems  Constitutional:  Negative for chills, fever, malaise/fatigue and weight loss.  HENT:  Negative for hearing loss, sinus pain and sore throat.   Respiratory:  Negative for cough and hemoptysis.   Cardiovascular:  Negative for chest pain, palpitations, leg swelling and PND.  Gastrointestinal:  Negative for abdominal pain, constipation, diarrhea, heartburn, nausea and vomiting.  Genitourinary:  Negative for dysuria, frequency and urgency.  Musculoskeletal:  Negative for back pain, myalgias and neck pain.  Skin:  Negative for itching and rash.  Neurological:  Negative for dizziness, tingling, seizures and headaches.  Endo/Heme/Allergies:  Negative for polydipsia.  Psychiatric/Behavioral:  Negative for depression. The patient is not nervous/anxious.    Objective:   BP  110/70 (BP Location: Left Arm, Patient Position: Sitting, Cuff Size: Large)   Pulse 91   Temp 98.6 F (37 C) (Temporal)   Ht 5' 7.25" (1.708 m)   Wt 189 lb (85.7 kg)   LMP 01/15/2015 (Exact Date) Comment: LMP 2016  SpO2 99%   BMI 29.38 kg/m  Body mass index is 29.38 kg/m.   General Appearance:    Alert, cooperative, no distress, appears stated age  Head:    Normocephalic, without obvious abnormality, atraumatic  Eyes:    PERRL, conjunctiva/corneas clear, EOM's intact, fundi    benign, both eyes  Ears:    Normal TM's and external ear canals, both ears  Nose:   Nares normal, septum midline, mucosa normal, no drainage    or sinus tenderness  Throat:   Lips, mucosa, and tongue normal; teeth and gums normal  Neck:   Supple, symmetrical, trachea midline, no adenopathy;    thyroid:  no enlargement/tenderness/nodules; no carotid   bruit or JVD  Back:     Symmetric, no curvature, ROM normal, no CVA tenderness  Lungs:     Clear to auscultation bilaterally, respirations unlabored  Chest Wall:    No tenderness or deformity   Heart:    Regular rate and rhythm, S1 and S2 normal, no murmur, rub or gallop  Breast Exam:    Deferred  Abdomen:     Soft, non-tender, bowel sounds active all four quadrants,    no masses, no organomegaly  Genitalia:    Deferred  Extremities:   Extremities normal, atraumatic, no cyanosis or edema  Pulses:   2+ and symmetric all extremities  Skin:   Skin color, texture, turgor normal, no rashes or lesions  Lymph nodes:   Cervical, supraclavicular, and axillary nodes normal  Neurologic:   CNII-XII intact, normal strength, sensation and reflexes    throughout    Assessment/Plan:   Routine physical examination Today patient counseled on age appropriate routine health concerns for screening and prevention, each reviewed and up to date or declined. Immunizations reviewed and up to date or declined. Labs ordered and reviewed. Risk factors for depression reviewed and  negative. Hearing function and visual acuity are intact. ADLs screened and addressed as needed. Functional ability and level of safety reviewed and appropriate. Education, counseling and referrals performed based on assessed risks today. Patient provided with a copy of personalized plan for preventive services.   Encounter for lipid screening for cardiovascular disease Update lipid panel and provide recommendations accordingly.   Overweight Work on [employer] and exercise as able.   Patient Counseling: [x]    Nutrition: Stressed importance of moderation in sodium/caffeine intake, saturated fat and cholesterol, caloric balance, sufficient intake of fresh fruits, vegetables, fiber, calcium, iron, and 1 mg of folate supplement per day (for females capable of pregnancy).  [x]    Stressed the importance of regular exercise.   [  x]   Substance Abuse: Discussed cessation/primary prevention of tobacco, alcohol, or other drug use; driving or other dangerous activities under the influence; availability of treatment for abuse.   [x]    Injury prevention: Discussed safety belts, safety helmets, smoke detector, smoking near bedding or upholstery.   [x]    Sexuality: Discussed sexually transmitted diseases, partner selection, use of condoms, avoidance of unintended pregnancy  and contraceptive alternatives.  [x]    Dental health: Discussed importance of regular tooth brushing, flossing, and dental visits.  [x]    Health maintenance and immunizations reviewed. Please refer to Health maintenance section.   CMA or LPN served as scribe during this visit. History, Physical, and Plan performed by medical provider. The above documentation has been reviewed and is accurate and complete.  , PA-C [facility]

## 2021-09-15 ENCOUNTER — Ambulatory Visit (INDEPENDENT_AMBULATORY_CARE_PROVIDER_SITE_OTHER): Payer: BC Managed Care – PPO | Admitting: "Endocrinology

## 2021-09-22 DIAGNOSIS — F411 Generalized anxiety disorder: Secondary | ICD-10-CM | POA: Diagnosis not present

## 2021-09-27 ENCOUNTER — Other Ambulatory Visit: Payer: Self-pay | Admitting: Adult Health

## 2021-09-28 NOTE — Telephone Encounter (Signed)
Patient is due for a refill on trazodone. Patient is up to date on her appointments. No data from [place] Controlled Substance Registry is available.

## 2021-10-02 DIAGNOSIS — E063 Autoimmune thyroiditis: Secondary | ICD-10-CM | POA: Diagnosis not present

## 2021-10-02 DIAGNOSIS — R5383 Other fatigue: Secondary | ICD-10-CM | POA: Diagnosis not present

## 2021-10-02 DIAGNOSIS — E559 Vitamin D deficiency, unspecified: Secondary | ICD-10-CM | POA: Diagnosis not present

## 2021-10-06 NOTE — Progress Notes (Signed)
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  HPI: Nancy [name] is a 40 y.o. Caucasian woman. She was unaccompanied.  1. Nancy [name] had her initial adult endocrine consultation with me on 02/03/11:  A. She was diagnosed with hypothyroidism due to Hashimoto's Thyroiditis in about September 2009, 6-7 months after the birth of her second child. She was started on levothyroxine.   B. When I saw her for that initial visit, she felt better on her Synthroid dose of 50 mcg/day on even-numbered days and 75 mcg/day on odd-numbered days, but was still cold. She had a 20+ gram gland. The remainder of her exam was normal  2. Clinical course: During the past ten years we have dealt with several issues:  A. We have gradually increased her dose of Synthroid/levothyroxine in order to attain a TSH goal of 1.0-2.0. Her symptoms of coldness and fatigue have improved, but not totally resolved.   B. Her weight has fluctuated during this time.   C. Her last regular menstrual period was on July 20th 2016. 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   D. In mid-February she had an anaphylactic reaction, possibly to keto bread, but she isn't sure. She now has an epipen. She had seen an allergist two weeks prior and her allergy tests were negative. Tests for alpha-gal allergy are pending.   3. Her last clinic visit was on 03/16/21. At that visit I continued her Synthroid dosage of two of the 112 mcg tablets per day. I continued her Citracal/D, 600/400, three times daily and added one Tums at bedtime.   A. In the interim she had been fairly healthy until this Spring.    1). In Late May or early June 2022 she developed covid-19. She really felt bad and was severely fatigued. She had to go to the ED several times due to having problems breathing, chest  pain, and severe brain fog. She also lost some fine motor skills and developed severe hand tremors that she could not type or hold things.Her pre-covid baseline brain fog worsened dramatically and frighteningly. She had severe brain fog with trouble thinking and remembering. At one point she was in her bathroom and could not figure out how to get out of the room. She lost a lot of memory for work tasks and other functional issues. She was not able to drive or to work for about 4-6 weeks.  She still has problems with brain fog, concentration, and memory, but not as severe. She is doing better, but has only recovered to about the 70% of her pre-covid functioning. She works part-time at 20 hours per week. She is also a full-time wife and mother.  [name] 2). Nancy [name]'s migraines have significantly improved since she was treated with botox. She is now having migraines only 1-2 times per month.     3). She is having more leg cramps and muscle spasms since having covid.        4). She has been sleeping better with trazodone.    5). She stopped doing yoga when her migraines got bad. She stopped walking a mile every morning. Her back is tight again.     6). Her diet hasn't been as consistent.   B. She remains on Synthroid, 224 mcg/day. She still takes Citracal-D two-three daily and trazodone at bedtime.    4. Pertinent Review  of Systems: Constitutional. [place] feels "tired, but fairly good".   Energy: Energy level is not so good now.   Body temperature: She is still very "cold natured".  Weight: She has re-gained 8 pounds.    Eyes: Her vision is good. She had an eye exam in September 2021. There are no problems with soreness, bulging, or limited range of eye movements.   Neck: She is not aware of any problems relating to the anterior neck and thyroid bed. There have been no significant problems with swelling, pain, soreness, tenderness, pressure, discomfort, or difficulty swallowing. Heart: She feels the expected  increase in heart rate during exercise or other physical activities. There have been no significant problems with palpitations, irregular heart beats, chest pain, or chest pressure. Gastrointestinal: She thinks that she has dairy intolerance. Bowel movements are normal. There are no significant complaints of excessive hunger, acid reflux, upset stomach, stomach aches or pains, diarrhea, or constipation. Hands: There are no significant problems with hand tremor, sweaty palms, palmar erythema.  Legs: She does not often have calf cramps anymore. No edema.  Psychological: She is doing "better".  She sees a Social worker once a month now instead of.every other week.  Mental: Her brain fog increased severely during and after her acute covid episode. She is doing better now, but need to take more concerted effort to keep herself organized. GYN: She has premature ovarian failure.  PAST MEDICAL, FAMILY, AND SOCIAL HISTORY: 1. 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 The family still lives in [place], [place], where her husband, [name], is a Company secretary. Her sister was also diagnosed with hypothyroidism secondary to Hashimoto's thyroiditis. She has a grand aunt who had Parkinson's Dz. [Addendum 02/19/11: Her paternal aunt was recently diagnosed with Hashimoto's disease.] 2. Activities: She has not been walking daily.    3. Tobacco, alcohol, and drugs: None 4. PCP: Ms. Inda Coke, PA 5. Neurology: Dr. Sarina Ill, [facility]  REVIEW OF SYSTEMS: Ms. carrol hougland has no other significant issues involving her other body systems.  PHYSICAL EXAM: BP 122/78 (BP Location: Right Arm, Patient Position: Sitting, Cuff Size: Normal)   Pulse 84   Wt 191 lb 12.8 oz (87 kg)   LMP 01/15/2015 (Exact Date) Comment: LMP 2016  BMI 29.82 kg/m   Wt Readings from Last 3 Encounters:  10/07/21 191 lb 12.8 oz (87 kg)  09/07/21 189 lb (85.7 kg)  06/04/21 184 lb 3.2  oz (83.6 kg)    Ht Readings from Last 3 Encounters:  09/07/21 5' 7.25" (1.708 m)  06/29/21 5' 7" (1.702 m)  06/04/21 5' 7" (1.702 m)   Constitutional: The patient looks healthy today. She has re-gained 8  pounds. She is bright and alert. Her affect and insight are normal.   Head: I do not detect any head tremor today.    Eyes: There is no arcus or proptosis.  Mouth: The oropharynx appears normal. The tongue appears normal. There is normal oral moisture. There is no obvious gingivitis. There is no oral  hyperpigmentation.  Neck: There are no bruits present. The thyroid gland appears normal on inspection. The thyroid gland is slightly enlarged today at about 20+ grams. Today the right lobe is top-normal size, but the left lobe is mildly enlarged. The consistency of the thyroid gland is normal. There is no thyroid tenderness to palpation. Lungs: The lungs are clear. Air movement is good. Heart:  The heart rhythm and rate appear normal. Heart sounds S1 and S2 are normal. I do not appreciate any pathologic heart murmurs. Abdomen: The abdomen is enlarged. Bowel sounds are normal. The abdomen is soft and non-tender. There is no obviously palpable hepatomegaly, splenomegaly, or other masses.  Arms: Muscle mass appears appropriate for age.  Hands: She has a trace tremor today. Phalangeal and metacarpophalangeal joints appear normal. Palms are warm. She has trace pallor of the fingernail beds when she flexes her fingers. She does not have any hyperpigmentation or erythema of the palms.   Legs: Muscle mass appears appropriate for age. There is no edema.  Neurologic: Muscle strength is normal for age and gender in both the upper and the lower extremities. Muscle tone appears normal. Sensation to touch is normal in the legs.  LAB DATA:   Labs 10/02/21 at 8:13 AM: TSH 2.68, free T4 1.3, free T3 2.8; ACTH , cortisol 27.2 (ref 4-22); PTH 12 (ref 16-77), calcium 9.6 (ref 8.6-10.2), 25-OH vitamin D 39,  calcitriol 57 (ref 18-72), phosphorus 2.6 (ref 2.5-4.5)  Labs 09/07/21: CMP normal, except alk phos 35 (ref 39-117); CBC normal; cholesterol 178, triglycerides 130, HDL 65, LDL 87  Labs 05/29/21 8:10 AM: TSH 1.93, free T4 1.3, free T3 2.5; ACTH 27 (ref 6-50), cortisol 29.2 (ref 4-22)   Labs 03/13/21: TSH 1.50, free T4 1.4, free T3 2.4; 25-OH vitamin D 33; PTH 16 (ref 17-77), calcium 9.5 (ref 8.6-10.2); alpha-gal panel negative  Labs 01/12/21: TSH 0.81, free T4 1.4, free T3 2.6  Labs 09/10/20: TSH 3.10, free T4 1.4, free T3 2.4; PTH 16 (14-64), calcium 9.5 (8.6-10.2), 25-OH vitamin D 40  Labs 7/12 21: TSH 2.16, free T4 1.3, free T3 2.6  Labs 09/01/20: CMP normal; CBC normal; cholesterol 163, triglycerides 238, HDL 58, LDL 72; Hepatitis C antibody non-reactive; HIV non-reactive  Labs 02/18/20: TSH 2.19, free T4 1.4, free T3 2.5; PTH 21, calcium 9.4, 25-OH vitamin D 31  Labs 12/10/19: TSH 2.17, free T4 1.2, free T3 2.8  Labs 08/14/19: TSH 3.77, free 4 1.4, free T3 2.4; CMP normal, with calcium 9.3; PTH 20 (ref 14-64); 25-OH vitamin D 29  Labs 05/21/19: TSH 0.17, free T4 1.4, free T3 2.9  Labs 02/06/19: TSH 0.14, free T4 1.7, free T3 3.2; PTH 15, calcium 9.1, 25-OH vitamin D 34  Labs 11/29/18: TSH 4.82, free T4 1.1, free T3 2.3  Labs 09/26/18: TSH 5.37, free T4 1.0, free T3 2.2  Labs 06/09/18: TSH 2.97, free T4 1.4, free T3 2.6; PTH 11, calcium 9.7, 25-OH vitamin 38  Labs 04/07/18: TSH 1.95, free T4 1.4, free T3 2.6  Labs 12/08/17: TSH 1.53, free T4 1.4, free T3 2.5  Labs 06/02/17: TSH 1.49, free T4 1.5, free T3 2.6; 1,25-OH vitamin D 70 (ref 18-72), 25-OH vitamin D 29, magnesium 2.1 (ref 1.5-2.5), phosphorus 3.0 (ref 2.5-4.5), PTH 25 (ref 14-64), calcium 9.3 (8.6-10.2); CMP normal   Labs 02/16/17: TSH 2.62, free T4 1.4, free T3 2.9  11/16/16: TSH 2.85, free T4 1.2, free T3 2.6  08/17/16: TSH 2.234, free T4 1.4, free T3 2.4  04/14/16: TSH 2.90, free T4 1.4, free T3 2.6  10/08/15: TSH 1.098,  free T4 1.10, free T3 3.0; CBC normal, iron 97  08/20/15: TSH 1.513, free T4 1.34, free T3 2.8  04/09/15: TSH 2.563, free T4 1.29, free T3 2.6; WBC 4.3, Hgb 13.3, Hct 39.9%; iron 95  10/07/14: TSH 2.478, free T4 1.39, free T3 2.8  04/03/14: TSH  4.281, free T4 1.27, free T3 3.0  10/04/13: TSH 2.393, free T4 1.34, free T3 2.9  03/16/13: TSH 2.862, free T4 1.14, free T3 2.7  01/22/13: TSH 4.261, free T4 1.14, free T3 2.9  06/21/12: TSH 2.427, free T4 1.11, free T3 3.1  03/20/12: TSH 2.980, free T4 1.14, free T3 3.1  ASSESSMENT: 1. Hypothyroidism, acquired, autoimmune:   A. Ailish is clinically euthyroid, with some hypothyroid symptoms. Her TFTs were mid-euthyroid in April 2022,  but are at about the 15%  now in October 2022. She has forgotten to take some of her levothyroxine doses recently. It is reasonable to ask her to take the thyroid hormone every day and repeat her TFTS in 3 months.   B. It appears that Angeli is one of those people who has a very narrow therapeutic window for Synthroid, so we really needs to keep her TSH between 1.0-2.0, which is the ideal goal range for TSH for patients who are on Synthroid/levothyroxine replacement.   C. We will continue to check her TFTs every 3 months and see her every 6 months in follow up.  2. Thyroiditis: Her thyroiditis is clinically quiescent today, but intermittently active.  3. Goiter: Her goiter has increased in size and is now slightly enlarged. The process of waxing and waning of thyroid gland size over time is c/w evolving Hashimoto's disease.  4. Pallor: Improved, but still present 5-6. Fatigue/poor sleep:   A. Her fatigue improved when her thyroid hormone levels were mid-euthyroid in the past and when she was sleeping better. I suspect that her early awakening has been a major factor in her fatigue.   B. After switching to trazodone she has been  sleeping much better.   C. Unfortunately, she is still fatigued and has more brain fog,  status post covid-19.   D. Given her autoimmune hypothyroidism and her presumably autoimmune premature ovarian failure, we need to anticipate possible Addison's disease. Fortunately, her ACTH and cortisol values have remained normal in June and October 2022.  7. Head tremor: None today 8. Tremor: She has 1+hand tremor today.    9. 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 With the addition of botox, Topiramate XR, and other medications, the frequency and severity of her migraines have markedly improved.    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 This problem has become increasingly more common in the population, paralleling the increasing incidence of autoimmune thyroid disease and other autoimmune diseases in the population at large.  11. Muscle cramps and spasms:   A. The cramps and spasms have been occurring less frequently, but still bother her at times.   B. Given the autoimmune nature of her hypothyroidism and POF, it was quite possible that she might be developing autoimmune hypoparathyroidism. Fortunately, in June 2018 her PTH, calcium, magnesium, phosphorus, and calcitriol levels were normal. Her vitamin D level was a bit low. Since then the vitamin D level has improved.  C. Her calcium and PTH were still good as of June 2019 and February 2020.   D. In her most labs in September 2021, the calcium had increased and the PTH had decreased, but the PTH was low normal and the calcium was only mid-normal. Her lab tests from 03/13/21 were normal.   E. Her labs in October 2022 showed that her PTH was low, but her calcium had increased, her 5-OH vitamin D had increased, her calcitriol was good, and her phosphorus  was good, altogether indicating that her PTH function is still good.   E. We need to re-assess her calcium, PTH, and calcitriol in 3 months.  12. Vitamin D deficiency:   A. Because she has been reducing her dairy intake, and  because her intestine absorbs vitamin D less well over time, she needs exogenous vitamin D.  B.  After converting to one Citracal-D at lunch, at dinner, and at bedtime, her 25-OH vitamin D has improved.  12. Covid long-hauler, chronic neurologic symptoms: She had severe neurologic/mental status problems due to covid. Fortunately, she is recovering, but is not yet back to her normal baseline.     Plan:  1. Diagnostic: Check TFTs, PTH, calcium, 25-OH vitamin D,  CBC, and iron in 3 months  2. Therapeutic: Continue levothyroxine dose of two of the 112 mcg pills per day. Continue one Citracal, 600/400 tablet three times daily. Add one Tums at bedtime. 3. Patient education: We discussed the fact that she has gradually lost thyroid cells over time. Therefore we will need to check her TFTs every 3-6 months and increase the dose of Synthroid as needed. We also discussed the need to draw labs to assess her PTH, calcium, and vitamin D status to identify if she develops autoimmune hypoparathyroidism. 4. Follow-up: FU in three months.   Level of Service: This visit lasted in excess of 75 minutes. More than 50% of the visit was devoted to counseling.  Tillman Sers, MD, CDE Adult and Pediatric Endocrinology

## 2021-10-07 ENCOUNTER — Encounter (INDEPENDENT_AMBULATORY_CARE_PROVIDER_SITE_OTHER): Payer: Self-pay | Admitting: "Endocrinology

## 2021-10-07 ENCOUNTER — Ambulatory Visit (INDEPENDENT_AMBULATORY_CARE_PROVIDER_SITE_OTHER): Payer: BC Managed Care – PPO | Admitting: "Endocrinology

## 2021-10-07 ENCOUNTER — Other Ambulatory Visit: Payer: Self-pay

## 2021-10-07 VITALS — BP 122/78 | HR 84 | Wt 191.8 lb

## 2021-10-07 DIAGNOSIS — E2839 Other primary ovarian failure: Secondary | ICD-10-CM

## 2021-10-07 DIAGNOSIS — E559 Vitamin D deficiency, unspecified: Secondary | ICD-10-CM | POA: Diagnosis not present

## 2021-10-07 DIAGNOSIS — R231 Pallor: Secondary | ICD-10-CM

## 2021-10-07 DIAGNOSIS — E063 Autoimmune thyroiditis: Secondary | ICD-10-CM

## 2021-10-07 DIAGNOSIS — E049 Nontoxic goiter, unspecified: Secondary | ICD-10-CM

## 2021-10-07 DIAGNOSIS — R252 Cramp and spasm: Secondary | ICD-10-CM

## 2021-10-07 DIAGNOSIS — R5383 Other fatigue: Secondary | ICD-10-CM

## 2021-10-07 DIAGNOSIS — G43711 Chronic migraine without aura, intractable, with status migrainosus: Secondary | ICD-10-CM

## 2021-10-07 DIAGNOSIS — U099 Post covid-19 condition, unspecified: Secondary | ICD-10-CM

## 2021-10-07 NOTE — Patient Instructions (Signed)
Follow up visit in 3 months. Please obtain lab tests 1-2 weeks prior.  

## 2021-10-08 LAB — VITAMIN D 1,25 DIHYDROXY
Vitamin D 1, 25 (OH)2 Total: 57 pg/mL (ref 18–72)
Vitamin D2 1, 25 (OH)2: <8 pg/mL
Vitamin D3 1, 25 (OH)2: 57 pg/mL

## 2021-10-08 LAB — PTH, INTACT AND CALCIUM
Calcium: 9.6 mg/dL (ref 8.6–10.2)
PTH: 12 pg/mL — ABNORMAL LOW (ref 16–77)

## 2021-10-08 LAB — TSH: TSH: 2.68 m[IU]/L

## 2021-10-08 LAB — T4, FREE: Free T4: 1.3 ng/dL (ref 0.8–1.8)

## 2021-10-08 LAB — CORTISOL: Cortisol, Plasma: 27.2 ug/dL — ABNORMAL HIGH

## 2021-10-08 LAB — VITAMIN D 25 HYDROXY (VIT D DEFICIENCY, FRACTURES): Vit D, 25-Hydroxy: 39 ng/mL (ref 30–100)

## 2021-10-08 LAB — ACTH: C206 ACTH: 25 pg/mL (ref 6–50)

## 2021-10-08 LAB — T3, FREE: T3, Free: 2.8 pg/mL (ref 2.3–4.2)

## 2021-10-08 LAB — PHOSPHORUS: Phosphorus: 2.6 mg/dL (ref 2.5–4.5)

## 2021-10-12 ENCOUNTER — Other Ambulatory Visit: Payer: Self-pay

## 2021-10-12 ENCOUNTER — Ambulatory Visit (INDEPENDENT_AMBULATORY_CARE_PROVIDER_SITE_OTHER): Payer: BC Managed Care – PPO | Admitting: Nurse Practitioner

## 2021-10-12 ENCOUNTER — Encounter: Payer: Self-pay | Admitting: Nurse Practitioner

## 2021-10-12 VITALS — BP 124/76 | Ht 66.5 in | Wt 192.0 lb

## 2021-10-12 DIAGNOSIS — Z01419 Encounter for gynecological examination (general) (routine) without abnormal findings: Secondary | ICD-10-CM | POA: Diagnosis not present

## 2021-10-12 DIAGNOSIS — L819 Disorder of pigmentation, unspecified: Secondary | ICD-10-CM | POA: Diagnosis not present

## 2021-10-12 DIAGNOSIS — Z79890 Hormone replacement therapy (postmenopausal): Secondary | ICD-10-CM

## 2021-10-12 NOTE — Progress Notes (Signed)
Nancy [name] 02-Dec-1981 [id number]   History:  40 y.o. G2P0002 presents for annual exam. Premature ovarian insufficiency at age 10. Normal pap history. Nuvaring continuously for hormone replacement and management of migraines with good relief. Auras with migraines, is doing botox now for management. Hypothyroidism managed by endocrinology. Had Covid in June and is still experiencing symptoms. She has a dark rash on groin without itching or redness, mostly discoloration.   Gynecologic History Patient's last menstrual period was 01/15/2015 (exact date).   Contraception: NuvaRing vaginal inserts, post menopausal status, and vasectomy Sexually active: Yes  Health Maintenance Last Pap: 09/26/2018. Results were: Normal, 5-year repeat Last mammogram: Never Last colonoscopy: Not indicated Last Dexa: Not indicated  Past medical history, past surgical history, family history and social history were all reviewed and documented in the EPIC chart. Married. 53 yo daughter, 72 yo son. Son T1DM. Works PT at [employer], husband is Education officer, environmental.   ROS:  A ROS was performed and pertinent positives and negatives are included.  Exam:  Vitals:   10/12/21 1101  BP: 124/76  Weight: 192 lb (87.1 kg)  Height: 5' 6.5" (1.689 m)    Body mass index is 30.53 kg/m.  General appearance:  Normal Thyroid:  Symmetrical, normal in size, without palpable masses or nodularity. Respiratory  Auscultation:  Clear without wheezing or rhonchi Cardiovascular  Auscultation:  Regular rate, without rubs, murmurs or gallops  Edema/varicosities:  Not grossly evident Abdominal  Soft,nontender, without masses, guarding or rebound.  Liver/spleen:  No organomegaly noted  Hernia:  None appreciated  Skin  Inspection:  Grossly normal. Slight hyperpigmentation or right groin. No redness Breasts: Examined lying and sitting.   Right: Without masses, retractions, discharge or axillary adenopathy.   Left: Without masses,  retractions, discharge or axillary adenopathy. Genitourinary   Inguinal/mons:  Normal without inguinal adenopathy  External genitalia:  Normal appearing vulva with no masses, tenderness, or lesions  BUS/Urethra/Skene's glands:  Normal  Vagina:  Normal appearing with normal color and discharge, no lesions  Cervix:  Normal appearing without discharge or lesions  Uterus:  Normal in size, shape and contour.  Midline and mobile, nontender  Adnexa/parametria:     Rt: Normal in size, without masses or tenderness.   Lt: Normal in size, without masses or tenderness.  Anus and perineum: Normal  Patient informed chaperone available to be present for breast and pelvic exam. Patient has requested no chaperone to be present. Patient has been advised what will be completed during breast and pelvic exam.    Assessment/Plan:  40 y.o. G2P0002 for annual exam.   Well female exam with routine gynecological exam - Education provided on SBEs, importance of preventative screenings, current guidelines, high calcium diet, regular exercise, and multivitamin daily. Labs with PCP and endocrinology.  Hormone replacement therapy - Uses Nuvaring continuously for migraine prevention and hot flashes with good relief. Will send refill when needed.   Discoloration of skin - has started having slight discoloration on groin without redness or itching. Thinks it may a symptom of Covid. If it continues or worsens it is recommended she see dermatology.   Screening for cervical cancer - Normal pap history. Will repeat pap at 5-year interval per guidelines.   Screening for breast cancer -has not had screening mammogram.  Discussed current guidelines and importance of preventative screenings.  Information provided on the breast center and she plans to schedule this soon. Normal breast exam today.   Follow up in 1 year for annual.  [facility] 121, 11:22 AM 10/12/2021

## 2021-10-20 DIAGNOSIS — F411 Generalized anxiety disorder: Secondary | ICD-10-CM | POA: Diagnosis not present

## 2021-10-22 ENCOUNTER — Other Ambulatory Visit: Payer: Self-pay | Admitting: Physician Assistant

## 2021-10-22 DIAGNOSIS — Z1231 Encounter for screening mammogram for malignant neoplasm of breast: Secondary | ICD-10-CM

## 2021-10-26 ENCOUNTER — Other Ambulatory Visit: Payer: Self-pay | Admitting: Neurology

## 2021-11-09 ENCOUNTER — Encounter: Payer: Self-pay | Admitting: Neurology

## 2021-11-11 ENCOUNTER — Other Ambulatory Visit: Payer: Self-pay | Admitting: Neurology

## 2021-11-11 DIAGNOSIS — G43711 Chronic migraine without aura, intractable, with status migrainosus: Secondary | ICD-10-CM | POA: Diagnosis not present

## 2021-11-11 DIAGNOSIS — U099 Post covid-19 condition, unspecified: Secondary | ICD-10-CM

## 2021-11-11 DIAGNOSIS — R4189 Other symptoms and signs involving cognitive functions and awareness: Secondary | ICD-10-CM

## 2021-11-12 ENCOUNTER — Other Ambulatory Visit: Payer: Self-pay

## 2021-11-12 ENCOUNTER — Ambulatory Visit (INDEPENDENT_AMBULATORY_CARE_PROVIDER_SITE_OTHER): Payer: BC Managed Care – PPO | Admitting: Adult Health

## 2021-11-12 DIAGNOSIS — G43009 Migraine without aura, not intractable, without status migrainosus: Secondary | ICD-10-CM | POA: Diagnosis not present

## 2021-11-12 NOTE — Progress Notes (Signed)
    Update 11/12/21: Reports that migraines continue to be well controlled on he has approximately 2 migraines a month.   BOTOX PROCEDURE NOTE FOR MIGRAINE HEADACHE    Contraindications and precautions discussed with patient(above). Aseptic procedure was observed and patient tolerated procedure. Procedure performed by Butch Penny, NP  The condition has existed for more than 6 months, and pt does not have a diagnosis of ALS, Myasthenia Gravis or Lambert-Eaton Syndrome.  Risks and benefits of injections discussed and pt agrees to proceed with the procedure.  Written consent obtained   Indication/Diagnosis: chronic migraine BOTOX(J0585) injection was performed according to protocol by [employer]. 200 units of BOTOX was dissolved into 4 cc NS.   NDC: [address]-47  Type of toxin: Botox Botox- 200 units x 1 vial Lot: [id number] Expiration: 03/2024 NDC: 0865-7846-96   Bacteriostatic 0.9% Sodium Chloride- 69mL total Lot: [id number] Expiration: 12/13/2022 NDC: 1324-4010-27   Dx: O53.664   Description of procedure:  The patient was placed in a sitting position. The standard protocol was used for Botox as follows, with 5 units of Botox injected at each site:   -Procerus muscle, midline injection  -Corrugator muscle, bilateral injection  -Frontalis muscle, bilateral injection, with 2 sites each side, medial injection was performed in the upper one third of the frontalis muscle, in the region vertical from the medial inferior edge of the superior orbital rim. The lateral injection was again in the upper one third of the forehead vertically above the lateral limbus of the cornea, 1.5 cm lateral to the medial injection site.  -Temporalis muscle injection, 4 sites, bilaterally. The first injection was 3 cm above the tragus of the ear, second injection site was 1.5 cm to 3 cm up from the first injection site in line with the tragus of the ear. The third injection site was 1.5-3 cm forward between  the first 2 injection sites. The fourth injection site was 1.5 cm posterior to the second injection site.  -Occipitalis muscle injection, 3 sites, bilaterally. The first injection was done one half way between the occipital protuberance and the tip of the mastoid process behind the ear. The second injection site was done lateral and superior to the first, 1 fingerbreadth from the first injection. The third injection site was 1 fingerbreadth superiorly and medially from the first injection site.  -Cervical paraspinal muscle injection, 2 sites, bilateral knee first injection site was 1 cm from the midline of the cervical spine, 3 cm inferior to the lower border of the occipital protuberance. The second injection site was 1.5 cm superiorly and laterally to the first injection site.  -Trapezius muscle injection was performed at 3 sites, bilaterally. The first injection site was in the upper trapezius muscle halfway between the inflection point of the neck, and the acromion. The second injection site was one half way between the acromion and the first injection site. The third injection was done between the first injection site and the inflection point of the neck.   Will return for repeat injection in 3 months.   A 200 units of Botox was used, 155 units were injected, the rest of the Botox was wasted. The patient tolerated the procedure well, there were no complications of the above procedure.  Butch Penny, MSN, NP-C 11/12/2021, 11:51 AM [facility] [address] [place], [place] [address] [phone]

## 2021-11-12 NOTE — Progress Notes (Signed)
Botox- 200 units x 1 vial Lot: [id number] Expiration: 03/2024 NDC: 5945-8592-92  Bacteriostatic 0.9% Sodium Chloride- 69mL total Lot: [id number] Expiration: 12/13/2022 NDC: 3817-7116-57  Dx: G43.009 S/P

## 2021-11-17 DIAGNOSIS — F411 Generalized anxiety disorder: Secondary | ICD-10-CM | POA: Diagnosis not present

## 2021-11-20 ENCOUNTER — Other Ambulatory Visit: Payer: Self-pay

## 2021-11-20 ENCOUNTER — Ambulatory Visit: Payer: BC Managed Care – PPO | Attending: Neurology

## 2021-11-20 DIAGNOSIS — R4701 Aphasia: Secondary | ICD-10-CM | POA: Insufficient documentation

## 2021-11-20 DIAGNOSIS — R41841 Cognitive communication deficit: Secondary | ICD-10-CM | POA: Diagnosis not present

## 2021-11-21 NOTE — Therapy (Signed)
[facility] [address] [place], [place], [address] Phone: [phone]   Fax:  [phone]  Speech Language Pathology Evaluation  Patient Details  Name: Nancy [name] MRN: [id number] Date of Birth: 12-May-1981 Referring Provider (SLP): Naomie Dean, MD   Encounter Date: 11/20/2021   End of Session - 11/20/21 1354     Visit Number 1    Number of Visits 17    Date for SLP Re-Evaluation 01/22/22    SLP Start Time 1104    SLP Stop Time  1145    SLP Time Calculation (min) 41 min    Activity Tolerance Patient tolerated treatment well             Past Medical History:  Diagnosis Date   Allergy    COVID    Eczema    Fatigue    Hashimoto's disease    Hypothyroidism, acquired, autoimmune    Migraines    Thyroiditis, autoimmune    Hashimotos    Vaginal delivery 2006, 2009    Past Surgical History:  Procedure Laterality Date   MOLE REMOVAL  12/13/2008   tongue growth  12/13/2008   WISDOM TOOTH EXTRACTION  12/13/1997    There were no vitals filed for this visit.   Subjective Assessment - 11/20/21 1330     Subjective "I don't remember how to spell simple words, or I misspell them."    Currently in Pain? No/denies                SLP Evaluation [facility] - 11/20/21 1330       SLP Visit Information   SLP Received On 11/20/21    Referring Provider (SLP) Naomie Dean, MD    Onset Date June 2022    Medical Diagnosis Cognitive decline; long COVID      Subjective   Patient/Family Stated Goal improve communication across situations and contexts      General Information   HPI Pt is 40 y.o. female with Hashimoto's Disease who contracted COVID-19 in early June 2022. Since that time pertinent sx for this evaluation include misspelling words, anomia, forgetfulness, and sx of attention deficits.      Balance Screen   Has the patient fallen in the past 6 months No      Prior Functional Status    Cognitive/Linguistic Baseline Baseline deficits    Baseline deficit details Slightly slower processing due to Hashimoto's      Cognition   Overall Cognitive Status Impaired/Different from baseline    Area of Impairment Memory;Attention   pt reports sx attention deficits; pt will suddenly have a "brain shuts off" and she endorses decr'd attention and having to refocus attention in order to complete the task (e.g., check writing, writing longer items like emails)     Auditory Comprehension   Overall Auditory Comprehension Impaired    Overall Auditory Comprehension Comments Pt's decr'd memory leads to decr'd auditory comprehension. This was demonstrated in a story retelling task. Nancy [name] recalled 9/18 thought items in a 30 second story presented auditorally. She provided yes/no answers correctly 5/6 questions related to the story.      Verbal Expression   Overall Verbal Expression Impaired   endorses aphasia/anomia daily; none seen during the evaluation today   Level of Generative/Spontaneous Verbalization Conversation    Other Verbal Expression Comments pt with some rare instances of pausing and hesitation during conversation - likely not outside WNL. Pt told SLP she spent a long time having to [employer]  of the word for "statement" in an email to an accountant - "This is something I work with every month!" pt expressed to SLP.      Written Expression   Written Expression Exceptions to [facility]    Self Formulation Ability Sentence    Overall Writen Expression Pt wrote 6-sentence summary (mod complex selection) and pt had to consult online dictionary to confirm or to look up spelling of words x3(currently, journey, and decided). Pt stated this occurs consistently in emails and texts.      Oral Motor/Sensory Function   Overall Oral Motor/Sensory Function Appears within functional limits for tasks assessed      Motor Speech   Overall Motor Speech Appears within functional limits for tasks assessed       Standardized Assessments   Standardized Assessments  [place] Naming Test-2nd edition;Other Assessment   subtests of Cog [place] (CLQT)   [place] Naming Test-2nd edition  58/60 (WNL)                             SLP Education - 11/20/21 1353     Education Details possible goals    Person(s) Educated Patient    Methods Explanation    Comprehension Verbalized understanding;Need further instruction              SLP Short Term Goals - 11/21/21 1559       SLP SHORT TERM GOAL #1   Title pt will demo auditory comprehension skills WNL with 8 minute auditory stimuli (e.g., TED talk, sermon, etc) using compensatory strategies in 2 sessions    Time 4    Period Weeks    Status New    Target Date 12/25/21   STGS extended one week due to holidays     SLP SHORT TERM GOAL #2   Title pt will participate verbally regarding a mod complex conversational topic for 10 minutes with modified independence in 2 sessions    Time 4    Period Weeks    Status New    Target Date 12/25/21      SLP SHORT TERM GOAL #3   Title pt will use compensatory strategies and re-check to produce a written sample of two paragraphs without errors in 3 sessions    Time 4    Period Weeks    Status New    Target Date 12/25/21      SLP SHORT TERM GOAL #4   Title pt will demo WNL topic maintenance in 20 minutes simple/mod complex conversation in 2 sessions    Time 4    Period Weeks    Status New    Target Date 12/18/21              SLP Long Term Goals - 11/21/21 1617       SLP LONG TERM GOAL #1   Title pt will demo auditory comprehension skills WNL with 20 minute auditory stimuli (e.g., TED talk, sermon, etc) using compensatory strategies in 2 sessions    Time 8    Period Weeks    Status New    Target Date 01/22/22      SLP LONG TERM GOAL #2   Title pt will participate verbally regarding a mod complex/complex conversational topic for 20 minutes with modified independence in 2  sessions    Time 8    Period Weeks    Status New    Target Date 01/22/22  SLP LONG TERM GOAL #3   Title pt will use compensatory strategies and re-check to produce a written sample of 2+ paragraphs without errors in 3 sessions    Time 8    Period Weeks    Status New    Target Date 01/22/22      SLP LONG TERM GOAL #4   Title pt will score higher on a QOL/outcome measure provided in last 1-2 sessions than the first measure taken during the first ST session    Time 8    Period Weeks    Status New    Target Date 01/22/22              Plan - 11/20/21 1354     Clinical Impression Statement Nancy [name] presents today with overt s/sx of auditory comprehension and written expression deficits. She also endorses daily occurrences of anomia in conversation, however this was not seen today in the 40 minute evaluation. Nancy [name] also tells SLP of some possible s/sx of attention deficits after COVID in June 2022. Pt states these defiicts her life negatively both at work and at home. She would benefit from skilled ST targeting auditory comprehension, and expressive language.    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 10/07/2021   Premature ovarian failure 09/15/2020   Chronic migraine without aura, with intractable migraine, so stated, with status migrainosus 07/31/2020   Sleeping difficulty 10/15/2015   Migraine with aura and without status migrainosus,  not intractable 08/19/2015   Tremor 04/14/2015   Goiter 09/21/2012   Hypothyroidism, acquired, autoimmune    Thyroiditis, autoimmune    Fatigue     Nancy [name], CCC-SLP 11/21/2021, 4:23 PM  [facility] [address] [place], [place], [address] Phone: [phone]   Fax:  [phone]  Name: Nancy [name] MRN: [id number] Date of Birth: 05-22-1981

## 2021-11-23 ENCOUNTER — Other Ambulatory Visit: Payer: Self-pay

## 2021-11-23 ENCOUNTER — Telehealth: Payer: Self-pay | Admitting: *Deleted

## 2021-11-23 ENCOUNTER — Encounter: Payer: Self-pay | Admitting: *Deleted

## 2021-11-23 ENCOUNTER — Ambulatory Visit: Payer: BC Managed Care – PPO

## 2021-11-23 DIAGNOSIS — R41841 Cognitive communication deficit: Secondary | ICD-10-CM

## 2021-11-23 DIAGNOSIS — R4701 Aphasia: Secondary | ICD-10-CM | POA: Diagnosis not present

## 2021-11-23 NOTE — Telephone Encounter (Signed)
[facility], key [id number].  Immediately approved Effective from 11/23/2021 through 11/21/2024.

## 2021-11-23 NOTE — Therapy (Signed)
[facility] [address], [address] [place], [place], [address] Phone: [phone]   Fax:  [phone]  Speech Language Pathology Treatment  Patient Details  Name: Nancy [name] MRN: [id number] Date of Birth: 01/03/1981 Referring Provider (SLP): Naomie Dean, MD   Encounter Date: 11/23/2021   End of Session - 11/23/21 1605     Visit Number 2    Number of Visits 17    Date for SLP Re-Evaluation 01/22/22    SLP Start Time 0945   15 minutes late   SLP Stop Time  1018    SLP Time Calculation (min) 33 min    Activity Tolerance Patient tolerated treatment well             Past Medical History:  Diagnosis Date   Allergy    COVID    Eczema    Fatigue    Hashimoto's disease    Hypothyroidism, acquired, autoimmune    Migraines    Thyroiditis, autoimmune    Hashimotos    Vaginal delivery 2006, 2009    Past Surgical History:  Procedure Laterality Date   MOLE REMOVAL  12/13/2008   tongue growth  12/13/2008   WISDOM TOOTH EXTRACTION  12/13/1997    There were no vitals filed for this visit.   Subjective Assessment - 11/23/21 0948     Subjective "I ahd three of those times where I needed to play charades."    Currently in Pain? No/denies                   ADULT SLP TREATMENT - 11/23/21 0948       General Information   Behavior/Cognition Alert;Cooperative;Pleasant mood      Treatment Provided   Treatment provided Cognitive-Linquistic      Cognitive-Linquistic Treatment   Treatment focused on Aphasia    Skilled Treatment "interim", "scans", [employer]" (password security software) were words pt had difficulty with Saturday and Sunday, verbally. SLP worked with pt today, teaching her semantic feature analysis (SFA) with "scans".  Reiterated during this process for words that are difficult to spell pt say them into her phone, say the letters as she writes them, then says the word another time. To get pt  quality of life measure, SLP provided communcation participation survey to pt and pt filled out for total of 9/30 (higher scores translate to more/regular/normal participation). Pt spoke for 10 minutes about an incident with her during church, and did so without anomia/dysnomia.      Assessment / Recommendations / Plan   Plan Continue with current plan of care      Progression Toward Goals   Progression toward goals Progressing toward goals                SLP Short Term Goals - 11/23/21 1606       SLP SHORT TERM GOAL #1   Title pt will demo auditory comprehension skills WNL with 8 minute auditory stimuli (e.g., TED talk, sermon, etc) using compensatory strategies in 2 sessions    Time 4    Period Weeks    Status On-going    Target Date 12/25/21   STGS extended one week due to holidays     SLP SHORT TERM GOAL #2   Title pt will participate verbally regarding a mod complex conversational topic for 10 minutes with modified independence in 2 sessions    Time 4    Period Weeks    Status On-going  Target Date 12/25/21      SLP SHORT TERM GOAL #3   Title pt will use compensatory strategies and re-check to produce a written sample of two paragraphs without errors in 3 sessions    Time 4    Period Weeks    Status On-going    Target Date 12/25/21      SLP SHORT TERM GOAL #4   Title pt will demo WNL topic maintenance in 20 minutes simple/mod complex conversation in 2 sessions    Time 4    Period Weeks    Status On-going    Target Date 12/18/21              SLP Long Term Goals - 11/23/21 1607       SLP LONG TERM GOAL #1   Title pt will demo auditory comprehension skills WNL with 20 minute auditory stimuli (e.g., TED talk, sermon, etc) using compensatory strategies in 2 sessions    Time 8    Period Weeks    Status On-going    Target Date 01/22/22      SLP LONG TERM GOAL #2   Title pt will participate verbally regarding a mod complex/complex conversational topic  for 20 minutes with modified independence in 2 sessions    Time 8    Period Weeks    Status On-going    Target Date 01/22/22      SLP LONG TERM GOAL #3   Title pt will use compensatory strategies and re-check to produce a written sample of 2+ paragraphs without errors in 3 sessions    Time 8    Period Weeks    Status On-going    Target Date 01/22/22      SLP LONG TERM GOAL #4   Title pt will score higher on a QOL/outcome measure provided in last 1-2 sessions than the first measure taken during the first ST session    Time 8    Period Weeks    Status On-going    Target Date 01/22/22              Plan - 11/23/21 1606     Clinical Impression Statement Nancy [name] cont to present today with overt s/sx of auditory comprehension and written expression deficits. She also endorsed 3 occurrences of anomia in conversation since eval session on Friday. Nancy [name] also tells SLP of some possible s/sx of attention deficits after COVID in June 2022. Pt states these defiicts her life negatively both at work and at home. She would benefit from skilled ST targeting auditory comprehension, and expressive language.    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, [address] [place], [place], [address] Phone: [phone]   Fax:  [phone]   Name: Nancy [name] MRN: [id number] Date of Birth: [birth year]

## 2021-11-24 ENCOUNTER — Ambulatory Visit
Admission: RE | Admit: 2021-11-24 | Discharge: 2021-11-24 | Disposition: A | Discharge status: Home / Self Care | Payer: BC Managed Care – PPO | Source: Ambulatory Visit

## 2021-11-24 ENCOUNTER — Other Ambulatory Visit: Payer: Self-pay | Admitting: Neurology

## 2021-11-24 DIAGNOSIS — Z1231 Encounter for screening mammogram for malignant neoplasm of breast: Secondary | ICD-10-CM | POA: Diagnosis not present

## 2021-11-25 ENCOUNTER — Ambulatory Visit: Payer: BC Managed Care – PPO

## 2021-11-25 ENCOUNTER — Other Ambulatory Visit: Payer: Self-pay

## 2021-11-25 DIAGNOSIS — R4701 Aphasia: Secondary | ICD-10-CM | POA: Diagnosis not present

## 2021-11-25 DIAGNOSIS — R41841 Cognitive communication deficit: Secondary | ICD-10-CM | POA: Diagnosis not present

## 2021-11-25 NOTE — Therapy (Signed)
[place] [facility] [address] [place], [place], [address] Phone: [phone]   Fax:  [phone]  Speech Language Pathology Treatment  Patient Details  Name: Nancy [name] MRN: [id number] Date of Birth: 12-06-1981 Referring Provider (SLP): Naomie Dean, MD   Encounter Date: 11/25/2021   End of Session - 11/25/21 1550     Visit Number 3    Number of Visits 17    Date for SLP Re-Evaluation 01/22/22    SLP Start Time 1450    SLP Stop Time  1533    SLP Time Calculation (min) 43 min    Activity Tolerance --             Past Medical History:  Diagnosis Date   Allergy    COVID    Eczema    Fatigue    Hashimoto's disease    Hypothyroidism, acquired, autoimmune    Migraines    Thyroiditis, autoimmune    Hashimotos    Vaginal delivery 2006, 2009    Past Surgical History:  Procedure Laterality Date   MOLE REMOVAL  12/13/2008   tongue growth  12/13/2008   WISDOM TOOTH EXTRACTION  12/13/1997    There were no vitals filed for this visit.          ADULT SLP TREATMENT - 11/25/21 1514       General Information   Behavior/Cognition Alert;Cooperative;Pleasant mood      Treatment Provided   Treatment provided Cognitive-Linquistic      Cognitive-Linquistic Treatment   Treatment focused on Aphasia;Cognition    Skilled Treatment Pt noted "consultation" with an anomic episode last night. She has not yet had time to run this word through SFA but will do so. She listened to a 7 minute TED talk today and attempted to take notes but had difficulty with written language during the auditory information being presented. SLP talked about how pt could manipulate the stimulus to improve her ability to comprehend the auditory information and then write down the important things from the auditory information. Pt to listen to Bible app and write down questions for Bible study with modifications PRN. Pt and SLP also talked  about how pt will also begin to more readily use written notes to assist her with daily tasks at home (mod complex topic) - pt did not appear to have any anomic episodes during this conversation. Nancy [name] summarized today's session (a mod complex topic) with minimally halting speech indicating slight difficulty with verbal Freight forward[employer].      Assessment / Recommendations / Plan   Plan Continue with current plan of care      Progression Toward Goals   Progression toward goals Progressing toward goals              SLP Education - 11/25/21 1549     Education Details using written language to assist with daily tasks    Person(s) Educated Patient    Methods Explanation    Comprehension Verbalized understanding              SLP Short Term Goals - 11/25/21 1552       SLP SHORT TERM GOAL #1   Title pt will demo auditory comprehension skills WNL with 8 minute auditory stimuli (e.g., TED talk, sermon, etc) using compensatory strategies in 2 sessions    Time 4    Period Weeks    Status On-going    Target Date 12/25/21   STGS extended  one week due to holidays     SLP SHORT TERM GOAL #2   Title pt will participate verbally regarding a mod complex conversational topic for 10 minutes with modified independence in 2 sessions    Baseline 11-25-21    Time 4    Period Weeks    Status On-going    Target Date 12/25/21      SLP SHORT TERM GOAL #3   Title pt will use compensatory strategies and re-check to produce a written sample of two paragraphs without errors in 3 sessions    Time 4    Period Weeks    Status On-going    Target Date 12/25/21      SLP SHORT TERM GOAL #4   Title pt will demo WNL topic maintenance in 20 minutes simple/mod complex conversation in 2 sessions    Time 4    Period Weeks    Status On-going    Target Date 12/18/21              SLP Long Term Goals - 11/25/21 1553       SLP LONG TERM GOAL #1   Title pt will demo auditory comprehension  skills WNL with 20 minute auditory stimuli (e.g., TED talk, sermon, etc) using compensatory strategies in 2 sessions    Time 8    Period Weeks    Status On-going      SLP LONG TERM GOAL #2   Title pt will participate verbally regarding a mod complex/complex conversational topic for 20 minutes with modified independence in 2 sessions    Time 8    Period Weeks    Status On-going      SLP LONG TERM GOAL #3   Title pt will use compensatory strategies and re-check to produce a written sample of 2+ paragraphs without errors in 3 sessions    Time 8    Period Weeks    Status On-going      SLP LONG TERM GOAL #4   Title pt will score higher on a QOL/outcome measure provided in last 1-2 sessions than the first measure taken during the first ST session    Time 8    Period Weeks    Status On-going              Plan - 11/25/21 1551     Clinical Impression Statement Nancy [name] cont to present today with overt s/sx of auditory comprehension and written expression deficits. She endorsed 1 occurrences of anomia in conversation last night. Nancy [name] cont to report some possible s/sx of attention deficits after COVID in June 2022. See "skilled intervention" for more details. Pt states these defiicts her life negatively both at work and at home. She would benefit from skilled ST targeting auditory comprehension, and expressive language.    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 [place], [place], [address] Phone: [phone]   Fax:  [phone]   Name: Nancy [name] MRN: [id number] Date of Birth: 1981-09-05

## 2021-11-26 ENCOUNTER — Other Ambulatory Visit: Payer: Self-pay | Admitting: Neurology

## 2021-11-30 ENCOUNTER — Other Ambulatory Visit: Payer: Self-pay

## 2021-11-30 ENCOUNTER — Ambulatory Visit: Payer: BC Managed Care – PPO

## 2021-11-30 DIAGNOSIS — R41841 Cognitive communication deficit: Secondary | ICD-10-CM | POA: Diagnosis not present

## 2021-11-30 DIAGNOSIS — R4701 Aphasia: Secondary | ICD-10-CM

## 2021-11-30 NOTE — Therapy (Signed)
[place] [facility] [address] [place], [place], [address] Phone: [phone]   Fax:  [phone]  Speech Language Pathology Treatment  Patient Details  Name: Nancy [name] MRN: [id number] Date of Birth: 1981/02/24 Referring Provider (SLP): Naomie Dean, MD   Encounter Date: 11/30/2021   End of Session - 11/30/21 1724     Visit Number 4    Number of Visits 17    Date for SLP Re-Evaluation 01/22/22    SLP Start Time 0851    SLP Stop Time  0931    SLP Time Calculation (min) 40 min    Activity Tolerance Patient tolerated treatment well             Past Medical History:  Diagnosis Date   Allergy    COVID    Eczema    Fatigue    Hashimoto's disease    Hypothyroidism, acquired, autoimmune    Migraines    Thyroiditis, autoimmune    Hashimotos    Vaginal delivery 2006, 2009    Past Surgical History:  Procedure Laterality Date   MOLE REMOVAL  12/13/2008   tongue growth  12/13/2008   WISDOM TOOTH EXTRACTION  12/13/1997    There were no vitals filed for this visit.   Subjective Assessment - 11/30/21 0856     Subjective "The entire day my phone was lit up with alarms."    Currently in Pain? No/denies                   ADULT SLP TREATMENT - 11/30/21 0903       General Information   Behavior/Cognition Alert;Cooperative;Pleasant mood      Treatment Provided   Treatment provided Cognitive-Linquistic      Pain Assessment   Pain Assessment No/denies pain      Cognitive-Linquistic Treatment   Treatment focused on Aphasia;Cognition    Skilled Treatment No anomia during a 10 minute explanation of life since the last appointment - pt described using alarms and other memory aids in order to complete daily chores. 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 Pt looked up "service" for spelling purposes - SLP told pt  to write this word ten times then write 5 sentences with "service". SLP told pt to do this same technique with words with which she experiences anomia. SLP provided pt with rationale for this technique.      Assessment / Recommendations / Plan   Plan Continue with current plan of care      Progression Toward Goals   Progression toward goals Progressing toward goals              SLP Education - 11/30/21 1716     Education Details rationale for writing target word and then in sentences    Person(s) Educated Patient    Methods Explanation    Comprehension Verbalized understanding              SLP Short Term Goals - 11/30/21 0906       SLP SHORT TERM GOAL #1   Title pt will demo auditory comprehension skills WNL with 8 minute auditory stimuli (e.g., TED talk, sermon, etc) using compensatory strategies in 2 sessions    Time 4    Period Weeks    Status On-going    Target Date 12/25/21   STGS extended one week due to holidays  SLP SHORT TERM GOAL #2   Title pt will participate verbally regarding a mod complex conversational topic for 10 minutes with modified independence in 2 sessions    Baseline 11-25-21    Status Achieved    Target Date 12/25/21      SLP SHORT TERM GOAL #3   Title pt will use compensatory strategies and re-check to produce a written sample of two paragraphs without errors in 3 sessions    Time 4    Period Weeks    Status On-going    Target Date 12/25/21      SLP SHORT TERM GOAL #4   Title pt will demo WNL topic maintenance in 20 minutes simple/mod complex conversation in 2 sessions    Time 4    Period Weeks    Status On-going    Target Date 12/18/21              SLP Long Term Goals - 11/30/21 0911       SLP LONG TERM GOAL #1   Title pt will demo auditory comprehension skills WNL with 20 minute auditory stimuli (e.g., TED talk, sermon, etc) using compensatory strategies in 2 sessions    Time 8    Period Weeks    Status On-going     Target Date 01/22/22      SLP LONG TERM GOAL #2   Title pt will participate verbally regarding a mod complex/complex conversational topic for 20 minutes with modified independence in 2 sessions    Time 8    Period Weeks    Status On-going    Target Date 01/22/22      SLP LONG TERM GOAL #3   Title pt will use compensatory strategies and re-check to produce a written sample of 2+ paragraphs without errors in 3 sessions    Time 8    Period Weeks    Status On-going    Target Date 01/22/22      SLP LONG TERM GOAL #4   Title pt will score higher on a QOL/outcome measure provided in last 1-2 sessions than the first measure taken during the first ST session    Time 8    Period Weeks    Status On-going    Target Date 01/22/22              Plan - 11/30/21 1724     Clinical Impression Statement Kosisochukwu cont to present today with overt s/sx of auditory comprehension and written expression deficits. She endorsed 1 occurrence of inability to spell a word ("service") in written correspondence during session today. Raelene cont to report some possible s/sx of attention deficits after COVID in June 2022. See "skilled intervention" for more details. Pt states these defiicts her life negatively both at work and at home. She would benefit from skilled ST targeting auditory comprehension, and expressive language.    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, [address] [place], [place], [address] Phone: [phone]   Fax:  [phone]   Name: ozetta flatley MRN: [id number] Date of Birth: 1980-12-24

## 2021-12-01 ENCOUNTER — Other Ambulatory Visit: Payer: Self-pay | Admitting: Neurology

## 2021-12-03 ENCOUNTER — Other Ambulatory Visit: Payer: Self-pay

## 2021-12-03 ENCOUNTER — Ambulatory Visit: Payer: BC Managed Care – PPO

## 2021-12-03 DIAGNOSIS — R4701 Aphasia: Secondary | ICD-10-CM

## 2021-12-03 DIAGNOSIS — R41841 Cognitive communication deficit: Secondary | ICD-10-CM | POA: Diagnosis not present

## 2021-12-03 NOTE — Therapy (Signed)
[facility] [address], [address] [place], [place], [address] Phone: [phone]   Fax:  [phone]  Speech Language Pathology Treatment  Patient Details  Name: Nancy [name] MRN: [id number] Date of Birth: 10/02/1981 Referring Provider (SLP): Naomie Dean, MD   Encounter Date: 12/03/2021   End of Session - 12/03/21 1558     Visit Number 5    Number of Visits 17    Date for SLP Re-Evaluation 01/22/22    SLP Start Time 1450    SLP Stop Time  1533    SLP Time Calculation (min) 43 min    Activity Tolerance Patient tolerated treatment well             Past Medical History:  Diagnosis Date   Allergy    COVID    Eczema    Fatigue    Hashimoto's disease    Hypothyroidism, acquired, autoimmune    Migraines    Thyroiditis, autoimmune    Hashimotos    Vaginal delivery 2006, 2009    Past Surgical History:  Procedure Laterality Date   MOLE REMOVAL  12/13/2008   tongue growth  12/13/2008   WISDOM TOOTH EXTRACTION  12/13/1997    There were no vitals filed for this visit.          ADULT SLP TREATMENT - 12/03/21 1526       General Information   Behavior/Cognition Alert;Cooperative;Pleasant mood      Treatment Provided   Treatment provided Cognitive-Linquistic      Cognitive-Linquistic Treatment   Treatment focused on Aphasia;Cognition    Skilled Treatment SLP discussed Nancy [name]'s latest anomic situation last night with her kids - "patron" was the word that she eventually described for her kids to assist her in finding. She did not write 5 sentences so SLP had her do this during session. She did not need to look up how to spell words with these sentences. SLP then moved to having Nancy [name] listen to TED talk to target auditory comprehension. Nancy [name] req'd SLP to stop once and rewind to beginning in the 5:07 presentation (mod complex mainly due to speed of presentation). Nancy [name] then produced a written summary of the first ~2  minutes of the presentation, looking for spelling of words x2 ([place], colonials). Nancy [name] produced adequate written summary for these two minutes.      Assessment / Recommendations / Plan   Plan Continue with current plan of care      Progression Toward Goals   Progression toward goals Progressing toward goals                SLP Short Term Goals - 12/03/21 1558       SLP SHORT TERM GOAL #1   Title Nancy [name] will demo auditory comprehension skills WNL with 8 minute auditory stimuli (e.g., TED talk, sermon, etc) using compensatory strategies in 2 sessions    Baseline 12-03-21    Time 4    Period Weeks    Status On-going    Target Date 12/25/21   STGS extended one week due to holidays     SLP SHORT TERM GOAL #2   Title Nancy [name] will participate verbally regarding a mod complex conversational topic for 10 minutes with modified independence in 2 sessions    Baseline 11-25-21    Status Achieved    Target Date 12/25/21      SLP SHORT TERM GOAL #3   Title Nancy [name] will use compensatory strategies and re-check  to produce a written sample of two paragraphs without errors in 3 sessions    Baseline 12-03-21    Time 4    Period Weeks    Status On-going    Target Date 12/25/21      SLP SHORT TERM GOAL #4   Title Nancy [name] will demo WNL topic maintenance in 20 minutes simple/mod complex conversation in 2 sessions    Time 4    Period Weeks    Status On-going    Target Date 12/18/21              SLP Long Term Goals - 12/03/21 1559       SLP LONG TERM GOAL #1   Title Nancy [name] will demo auditory comprehension skills WNL with 20 minute auditory stimuli (e.g., TED talk, sermon, etc) using compensatory strategies in 2 sessions    Time 8    Period Weeks    Status On-going    Target Date 01/22/22      SLP LONG TERM GOAL #2   Title Nancy [name] will participate verbally regarding a mod complex/complex conversational topic for 20 minutes with modified independence in 2 sessions    Time 8    Period Weeks    Status  On-going    Target Date 01/22/22      SLP LONG TERM GOAL #3   Title Nancy [name] will use compensatory strategies and re-check to produce a written sample of 2+ paragraphs without errors in 3 sessions    Time 8    Period Weeks    Status On-going    Target Date 01/22/22      SLP LONG TERM GOAL #4   Title Nancy [name] will score higher on a QOL/outcome measure provided in last 1-2 sessions than the first measure taken during the first ST session    Time 8    Period Weeks    Status On-going    Target Date 01/22/22              Plan - 12/03/21 1558     Clinical Impression Statement Nancy [name] to present today with overt s/sx of auditory comprehension and written expression deficits. She endorsed 1 occurrence of inability to spell a word ("service") in written correspondence during session today. Nancy [name] [name] to report some possible s/sx of attention deficits after COVID in June 2022. See "skilled intervention" for more details. Nancy [name] states these defiicts her life negatively both at work and at home. She would benefit from skilled ST targeting auditory comprehension, and expressive language.    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 10/07/2021   Premature ovarian failure 09/15/2020   Chronic migraine without aura, with intractable migraine, so stated, with status migrainosus 07/31/2020   Sleeping difficulty 10/15/2015   Migraine with aura and without status  migrainosus, not intractable 08/19/2015   Tremor 04/14/2015   Goiter 09/21/2012   Hypothyroidism, acquired, autoimmune    Thyroiditis, autoimmune    Fatigue     Adrian Dinovo, CCC-SLP 12/03/2021, 4:00 PM  [facility] [address] [place], [place], [address] Phone: [phone]   Fax:  [phone]   Name: juliann olesky MRN: [id number] Date of Birth: 10-02-1981

## 2021-12-03 NOTE — Patient Instructions (Signed)
Complete 3 TED talks (7-8 minutes) before next session

## 2021-12-10 ENCOUNTER — Other Ambulatory Visit: Payer: Self-pay

## 2021-12-10 ENCOUNTER — Ambulatory Visit: Payer: BC Managed Care – PPO

## 2021-12-10 DIAGNOSIS — R41841 Cognitive communication deficit: Secondary | ICD-10-CM

## 2021-12-10 DIAGNOSIS — R4701 Aphasia: Secondary | ICD-10-CM

## 2021-12-10 NOTE — Therapy (Signed)
[facility] [address] [place], [place], [address] Phone: [phone]   Fax:  [phone]  Speech Language Pathology Treatment  Patient Details  Name: Nancy [name] MRN: [id number] Date of Birth: 1981/10/28 Referring Provider (SLP): Naomie Dean, MD   Encounter Date: 12/10/2021   End of Session - 12/10/21 1708     Visit Number 6    Number of Visits 17    Date for SLP Re-Evaluation 01/22/22    SLP Start Time 1448    SLP Stop Time  1532    SLP Time Calculation (min) 44 min    Activity Tolerance Patient tolerated treatment well             Past Medical History:  Diagnosis Date   Allergy    COVID    Eczema    Fatigue    Hashimoto's disease    Hypothyroidism, acquired, autoimmune    Migraines    Thyroiditis, autoimmune    Hashimotos    Vaginal delivery 2006, 2009    Past Surgical History:  Procedure Laterality Date   MOLE REMOVAL  12/13/2008   tongue growth  12/13/2008   WISDOM TOOTH EXTRACTION  12/13/1997    There were no vitals filed for this visit.   Subjective Assessment - 12/10/21 1542     Subjective "I had to stop and rewind, stop and rewind, and I had spelling problems too."    Currently in Pain? No/denies                   ADULT SLP TREATMENT - 12/10/21 1544       General Information   Behavior/Cognition Alert;Cooperative;Pleasant mood      Treatment Provided   Treatment provided Cognitive-Linquistic      Cognitive-Linquistic Treatment   Treatment focused on Aphasia;Cognition    Skilled Treatment Pt had spelling difficulties with "psychological", "vomit", "metal", and "miracle". Pt looked up these words on her phone and was successful with obtaining correct spelling. Pt completed written words and sentences  with these words, as directed. Anomia with "settings" today at work. Pt's compensation was not effective. SLP reviewed anomia compensations with pt and she gave  example of each verbal compensation with success. SLP then had pt write out the word and sentences for practice for "settings". Pt had spelling error on "install" so she wrote the word out x5 and made 5 sentences with this word. During this, her sentences included metal, and miracle without the need to look up spelling for these words. 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      Assessment / Recommendations / Plan   Plan Continue with current plan of care      Progression Toward Goals   Progression toward goals Progressing toward goals                SLP Short Term Goals - 12/10/21 1710       SLP SHORT TERM GOAL #1   Title pt will demo auditory comprehension skills WNL with 8 minute auditory stimuli (e.g., TED talk, sermon, etc) using compensatory strategies in 2 sessions    Baseline 12-03-21    Status Achieved   manipulating rewind for functional notetaking   Target Date 12/25/21   STGS extended one week due to holidays     SLP SHORT TERM GOAL #2   Title pt will participate verbally regarding a  mod complex conversational topic for 10 minutes with modified independence in 2 sessions    Baseline 11-25-21    Status Achieved    Target Date 12/25/21      SLP SHORT TERM GOAL #3   Title pt will use compensatory strategies and re-check to produce a written sample of two paragraphs without errors in 3 sessions    Baseline 12-03-21    Time 4    Period Weeks    Status On-going    Target Date 12/25/21      SLP SHORT TERM GOAL #4   Title pt will demo WNL topic maintenance in 20 minutes simple/mod complex conversation in 2 sessions    Baseline 12-10-21    Time 4    Period Weeks    Status On-going    Target Date 12/18/21              SLP Long Term Goals - 12/10/21 1711       SLP LONG TERM GOAL #1   Title pt will demo auditory comprehension skills WNL with 20 minute auditory stimuli (e.g., TED talk, sermon, etc)  using compensatory strategies in 2 sessions    Time 8    Period Weeks    Status On-going    Target Date 01/22/22      SLP LONG TERM GOAL #2   Title pt will participate verbally regarding a mod complex/complex conversational topic for 20 minutes with modified independence in 2 sessions    Time 8    Period Weeks    Status On-going    Target Date 01/22/22      SLP LONG TERM GOAL #3   Title pt will use compensatory strategies and re-check to produce a written sample of 2+ paragraphs without errors in 3 sessions    Time 8    Period Weeks    Status On-going    Target Date 01/22/22      SLP LONG TERM GOAL #4   Title pt will score higher on a QOL/outcome measure provided in last 1-2 sessions than the first measure taken during the first ST session    Time 8    Period Weeks    Status On-going    Target Date 01/22/22              Plan - 12/10/21 1709     Clinical Impression Statement Nancy [name] cont to present today with overt s/sx of auditory comprehension and written expression deficits. She endorsed 1 occurrence of inability to spell a word ("install") in written correspondence during session today. Nancy [name] cont to report some possible s/sx of attention deficits after COVID in June 2022. See "skilled intervention" for more details. Pt states these defiicts her life negatively both at work and at home. She would benefit from skilled ST targeting auditory comprehension, and expressive language.    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 [place], [place], [address] Phone: [phone]   Fax:  [phone]   Name: Nancy [name] MRN: [id number] Date of Birth: 1981/12/01

## 2021-12-12 ENCOUNTER — Other Ambulatory Visit: Payer: Self-pay | Admitting: Nurse Practitioner

## 2021-12-12 DIAGNOSIS — Z79890 Hormone replacement therapy (postmenopausal): Secondary | ICD-10-CM

## 2021-12-15 DIAGNOSIS — F411 Generalized anxiety disorder: Secondary | ICD-10-CM | POA: Diagnosis not present

## 2021-12-15 NOTE — Telephone Encounter (Signed)
AEX and mammo UTD.  °

## 2021-12-16 ENCOUNTER — Other Ambulatory Visit: Payer: Self-pay

## 2021-12-16 ENCOUNTER — Ambulatory Visit: Payer: BC Managed Care – PPO | Attending: Neurology

## 2021-12-16 DIAGNOSIS — R41841 Cognitive communication deficit: Secondary | ICD-10-CM | POA: Insufficient documentation

## 2021-12-16 DIAGNOSIS — R4701 Aphasia: Secondary | ICD-10-CM | POA: Insufficient documentation

## 2021-12-16 NOTE — Patient Instructions (Addendum)
° °  Memory Compensation Strategies  Use WARM strategy. W= write it down A=  associate it R=  repeat it M=  make a mental picture  You can keep a Memory Notebook. Use a 3-ring notebook with sections for the following:  calendar, important names and phone numbers, medications, doctors names/phone numbers, to do list/reminders, and a section to journal what you did each day  Use a calendar to write appointments down.  Write yourself a schedule for the day.  This can be placed on the calendar or in a separate section of the Memory Notebook.  Keeping a regular schedule can help memory.  Use medication organizer with sections for each day or morning/evening pills  You may need help loading it  Keep a basket, or pegboard by the door.   Place items that you need to take out with you in the basket or on the pegboard.  You may also want to include a message board for reminders.  Use sticky notes. Place sticky notes with reminders in a place where the task is performed.  For example:  turn off the stove placed by the stove, lock the door placed on the door at eye level, take your medications on the bathroom mirror or by the place where you normally take your medications  Use alarms, timers, and/or a reminder app. Use while cooking to remind yourself to check on food or as a reminder to take your medicine, or as a reminder to make a call, or as a reminder to perform another task, etc.  Use a voice recorder app or small tape recorder to record important information and notes for yourself. Go back at the end of the day and listen to these.   Divided Attention ideas - any can be done together  Playing a card game either by yourself or with someone  Playing a board game with someone  Sorting playing cards  Writing down commercials on TV or the radio  Writing down news stories on TV or radio  Working outside with yard work, gardening, Catering manager.  [employer] [place]  Recite state names  and capitals  Complete long division or 2 or 3 digit multiplication problems  Listen to a Therapist, sports (i.e., YouTube), to online news (NPR, CNN, etc.), or to a family member, and give pertinent information from the speech, news story, or conversation after it is over  Empty the dishwasher  Complete paper/pencil puzzles (sudoku, crosswords, word search)  Complete puzzles online (www.lumosity.com -computer/tablet based, or www.constanttherapy.com - iPad based)  Therapy worksheets/exercises  Put together a puzzle  State coins needed to make a certain change amount  Have someone read a portion of a novel to you, and you give a summary    Start at a level that you are challenged by, but also where you can see some success

## 2021-12-17 NOTE — Therapy (Signed)
[facility] [address] [place], [place], [address] Phone: [phone]   Fax:  [phone]  Speech Language Pathology Treatment/ Discharge Summary  Patient Details  Name: Nancy [name] MRN: [id number] Date of Birth: 1981/08/24 Referring Provider (SLP): Sarina Ill, MD   Encounter Date: 12/16/2021   End of Session - 12/17/21 1146     Visit Number 7    Number of Visits 17    Date for SLP Re-Evaluation 01/22/22    SLP Start Time 1533    SLP Stop Time  4656    SLP Time Calculation (min) 42 min    Activity Tolerance Patient tolerated treatment well             Past Medical History:  Diagnosis Date   Allergy    COVID    Eczema    Fatigue    Hashimoto's disease    Hypothyroidism, acquired, autoimmune    Migraines    Thyroiditis, autoimmune    Hashimotos    Vaginal delivery 2006, 2009    Past Surgical History:  Procedure Laterality Date   MOLE REMOVAL  12/13/2008   tongue growth  12/13/2008   WISDOM TOOTH EXTRACTION  12/13/1997    There were no vitals filed for this visit.  SPEECH THERAPY DISCHARGE SUMMARY  Visits from Start of Care: 7  Current functional level related to goals / functional outcomes: See goals - pt made some progress with compensatory measures for written language, spoken language, anmd with memory and attention. Pt specifically stated that writing trouble words assisted next time she had to spell those words.  Remaining deficits: Mild expressive aphasia, mild cognitive communication deficits, both mitigated with compensatory measures.   Education / Equipment: Compensatory strategies, divided attention tasks, memory tips/strategies   Patient agrees to discharge. Patient goals were partially met. Patient is being discharged due to being pleased with the current functional level..      Subjective Assessment - 12/16/21 1536     Subjective Pt left her homework at home - "My  memory's not what it used to be."    Currently in Pain? No/denies                   ADULT SLP TREATMENT - 12/17/21 0001       General Information   Behavior/Cognition Alert;Cooperative;Pleasant mood      Cognitive-Linquistic Treatment   Treatment focused on Aphasia;Cognition    Skilled Treatment In taking notes "rental", "friendly", and "premiums" pt had to look up spelling. She has not yet written her sentences for those words but plans to. She reports that she has been able to spell the word correctly in subsequent opportunities. Pt provided description of a TED talk which she listened to and took notes. Nancy [name] told SLP that she would not have been able to verbalize as many details had she not had written cues/notes taken at the time. SLP inquired what pt was thinking about cont'd ST and the compensations she has learned and the things she is doing at home. Pt stated she was fine with d/c today and would cont with the same techniques and tasks at home in order to cont to work on language skills, and cognition.      Assessment / Recommendations / Plan   Plan Discharge SLP treatment due to (comment)   pt satisfied with current skills and continuing working at home on her own     Progression Toward Goals  Progression toward goals --   d/c day - see goals             SLP Education - 12/17/21 1145     Education Details memory compensations, divided attention tasks to do at home    Person(s) Educated Patient    Methods Explanation;Handout    Comprehension Verbalized understanding              SLP Short Term Goals - 12/17/21 1147       SLP SHORT TERM GOAL #1   Title pt will demo auditory comprehension skills WNL with 8 minute auditory stimuli (e.g., TED talk, sermon, etc) using compensatory strategies in 2 sessions    Baseline 12-03-21    Status Achieved   manipulating rewind for functional notetaking   Target Date 12/25/21   STGS extended one week due to holidays      SLP SHORT TERM GOAL #2   Title pt will participate verbally regarding a mod complex conversational topic for 10 minutes with modified independence in 2 sessions    Baseline 11-25-21    Status Achieved    Target Date 12/25/21      SLP SHORT TERM GOAL #3   Title pt will use compensatory strategies and re-check to produce a written sample of two paragraphs without errors in 3 sessions    Baseline 12-03-21    Status Partially Met    Target Date 12/25/21      SLP SHORT TERM GOAL #4   Title pt will demo WNL topic maintenance in 20 minutes simple/mod complex conversation in 2 sessions    Baseline 12-10-21, 12-16-21    Status Achieved    Target Date 12/18/21              SLP Long Term Goals - 12/17/21 1147       SLP LONG TERM GOAL #1   Title pt will demo auditory comprehension skills WNL with 20 minute auditory stimuli (e.g., TED talk, sermon, etc) using compensatory strategies in 2 sessions    Time 8    Period Weeks    Status Deferred      SLP LONG TERM GOAL #2   Title pt will participate verbally regarding a mod complex/complex conversational topic for 20 minutes with modified independence in 2 sessions    Baseline 12-16-21    Time 8    Period Weeks    Status Partially Met      SLP LONG TERM GOAL #3   Title pt will use compensatory strategies and re-check to produce a written sample of 2+ paragraphs without errors in 3 sessions    Time 8    Period Weeks    Status Deferred      SLP LONG TERM GOAL #4   Title pt will score higher on a QOL/outcome measure provided in last 1-2 sessions than the first measure taken during the first ST session    Time 8    Period Weeks    Status Deferred              Plan - 12/17/21 [place] cont to present today with overt s/sx of auditory comprehension and written expression deficits. She endorsed 1 occurrence of inability to spell a word ("install") in written correspondence during session  today. Nancy [name] cont to report some possible s/sx of attention deficits after COVID in June 2022. See "skilled intervention" for more details. Pt states she is satisfied with her  current status and will cont to complete tasks and use compensations at home.    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, [place], [place], [address] Phone: [phone]   Fax:  [phone]   Name: Nancy [name] MRN: [id number] Date of Birth: [birth year]

## 2021-12-27 ENCOUNTER — Other Ambulatory Visit: Payer: Self-pay | Admitting: Neurology

## 2021-12-29 NOTE — Telephone Encounter (Signed)
Patient is up to date on her appointments. Patient is due for a refill on trazodone.

## 2021-12-31 ENCOUNTER — Other Ambulatory Visit: Payer: Self-pay | Admitting: Neurology

## 2022-01-04 DIAGNOSIS — E559 Vitamin D deficiency, unspecified: Secondary | ICD-10-CM | POA: Diagnosis not present

## 2022-01-04 DIAGNOSIS — R231 Pallor: Secondary | ICD-10-CM | POA: Diagnosis not present

## 2022-01-04 DIAGNOSIS — E063 Autoimmune thyroiditis: Secondary | ICD-10-CM | POA: Diagnosis not present

## 2022-01-05 LAB — VITAMIN D 25 HYDROXY (VIT D DEFICIENCY, FRACTURES): Vit D, 25-Hydroxy: 35 ng/mL (ref 30–100)

## 2022-01-05 LAB — CBC WITH DIFFERENTIAL/PLATELET
Absolute Monocytes: 337 cells/uL (ref 200–950)
Basophils Absolute: 31 cells/uL (ref 0–200)
Basophils Relative: 0.6 %
Eosinophils Absolute: 61 cells/uL (ref 15–500)
Eosinophils Relative: 1.2 %
HCT: 39.4 % (ref 35.0–45.0)
Hemoglobin: 13.2 g/dL (ref 11.7–15.5)
Lymphs Abs: 2030 cells/uL (ref 850–3900)
MCH: 29 pg (ref 27.0–33.0)
MCHC: 33.5 g/dL (ref 32.0–36.0)
MCV: 86.6 fL (ref 80.0–100.0)
MPV: 11.5 fL (ref 7.5–12.5)
Monocytes Relative: 6.6 %
Neutro Abs: 2642 cells/uL (ref 1500–7800)
Neutrophils Relative %: 51.8 %
Platelets: 196 10*3/uL (ref 140–400)
RBC: 4.55 10*6/uL (ref 3.80–5.10)
RDW: 12.3 % (ref 11.0–15.0)
Total Lymphocyte: 39.8 %
WBC: 5.1 10*3/uL (ref 3.8–10.8)

## 2022-01-05 LAB — T3, FREE: T3, Free: 3.2 pg/mL (ref 2.3–4.2)

## 2022-01-05 LAB — T4, FREE: Free T4: 1.6 ng/dL (ref 0.8–1.8)

## 2022-01-05 LAB — PTH, INTACT AND CALCIUM
Calcium: 9.7 mg/dL (ref 8.6–10.2)
PTH: 18 pg/mL (ref 16–77)

## 2022-01-05 LAB — IRON: Iron: 85 ug/dL (ref 40–190)

## 2022-01-05 LAB — TSH: TSH: 0.09 mIU/L — ABNORMAL LOW

## 2022-01-07 ENCOUNTER — Telehealth (INDEPENDENT_AMBULATORY_CARE_PROVIDER_SITE_OTHER): Payer: Self-pay | Admitting: "Endocrinology

## 2022-01-07 DIAGNOSIS — E063 Autoimmune thyroiditis: Secondary | ICD-10-CM

## 2022-01-07 NOTE — Telephone Encounter (Signed)
I called her to discuss her recent lab tests from 01/04/22: Subjective: She has be feeling somewhat hyperthyroid for 2-3 weeks.  She has had brain fog and memory difficulties ever since she developed covid in June. Cognitive therapy did not help. She says that the brain fog is worse. However, she may not be having as many memory lapses as she was having 1-2 months ago. She is feeling very cold. She is not sleeping well due to having frequent early awakening. She is tired all the time.  She continues to take two of the 112 mcg levothyroxine tablets every day. She has not had any changes in medications. She is not taking any new vitamins or supplements. Objective:  TFTs are hyperthyroid. CBC, iron PTH, calcium, and vitamin D, iron are normal. She sounds very lucid. Her affect and insight seem good.  Assessment: She is hyperthyroid, but the cause is unclear.  The covid virus may have caused her liver to have decreased metabolism of the levothyroxine.  Her current generic levothyroxine tablets may be stronger than the ones she had in October.  She may have inadvertently taken too many levothyroxine tablets due to forgetting that she had already taken them and then taking extra doses. 5. Plan:  a. We will decrease her levothyroxine dosage to 1.5 of the 112 mcg tablets daily and repeat her TFTs in two months.   b.  She asked if we could postpone her next clinic appointment from next Monday to two months from now. I told her that will be fine. I will put in an order to repeat her TFTs 1-2 weeks prior. She was happy with that option.  Molli Knock, MD, CDCES

## 2022-01-08 ENCOUNTER — Ambulatory Visit (INDEPENDENT_AMBULATORY_CARE_PROVIDER_SITE_OTHER): Payer: BC Managed Care – PPO | Admitting: "Endocrinology

## 2022-01-11 ENCOUNTER — Ambulatory Visit (INDEPENDENT_AMBULATORY_CARE_PROVIDER_SITE_OTHER): Payer: BC Managed Care – PPO | Admitting: "Endocrinology

## 2022-01-12 DIAGNOSIS — F411 Generalized anxiety disorder: Secondary | ICD-10-CM | POA: Diagnosis not present

## 2022-01-25 ENCOUNTER — Encounter (INDEPENDENT_AMBULATORY_CARE_PROVIDER_SITE_OTHER): Payer: Self-pay | Admitting: "Endocrinology

## 2022-01-25 ENCOUNTER — Telehealth (INDEPENDENT_AMBULATORY_CARE_PROVIDER_SITE_OTHER): Payer: Self-pay

## 2022-01-25 ENCOUNTER — Telehealth (INDEPENDENT_AMBULATORY_CARE_PROVIDER_SITE_OTHER): Payer: Self-pay | Admitting: "Endocrinology

## 2022-01-25 NOTE — Telephone Encounter (Signed)
Patient called. Subjective: She has been really feeling "off", with shakiness, muscle weakness, tired, staying asleep, not as cold. She has been having more "floaty in her head" sensations and she has had more spinning dizziness. She is not having any tinnitus or hearing problems.  She had similar dizziness in the past and again in the past several weeks.  Her brain fog is a little better, a little clearer. We decreased her levothyroxine on 01/07/22 to 1.5 of the 112 mcg tablets per day. She feels shakier since then.  3. Assessment:   A. She appears to be having a recurrence of benign vertigo.   B. I  doubt that the reduction in her levothyroxine dosage had anything to do with these recent symptoms.  4. Plan:  A. Take in plenty of liquids. Rest as much as possible. B.Start meclizine, 25 mg, three times daily, as needed, for 5 days.   Tillman Sers, MD, CDE

## 2022-01-28 ENCOUNTER — Other Ambulatory Visit: Payer: Self-pay | Admitting: Adult Health

## 2022-01-28 ENCOUNTER — Encounter (INDEPENDENT_AMBULATORY_CARE_PROVIDER_SITE_OTHER): Payer: Self-pay | Admitting: "Endocrinology

## 2022-01-29 ENCOUNTER — Encounter: Payer: Self-pay | Admitting: Neurology

## 2022-01-29 ENCOUNTER — Telehealth (INDEPENDENT_AMBULATORY_CARE_PROVIDER_SITE_OTHER): Payer: Self-pay | Admitting: "Endocrinology

## 2022-01-29 NOTE — Telephone Encounter (Signed)
She called to give me an update. Subjective: She takes up to 125 mg of Dramamine at various times during the day. Her dizziness is better, but the medication makes her feel very sleepy. She has almost constant weakness in her arms. The weakness in her legs comes and goes. The brain fog is worse before covid, but better than when she adjusted her thyroid medication two weeks ago.  Assessment: Her symptoms seem much more neurologic than thyroid-related. Plan: I suggested that she contact Dr. Lucia Gaskins to discuss these issues. She will do so today. Armanda Magic, CDCES

## 2022-02-01 ENCOUNTER — Encounter: Payer: Self-pay | Admitting: Physician Assistant

## 2022-02-01 ENCOUNTER — Other Ambulatory Visit: Payer: Self-pay

## 2022-02-01 ENCOUNTER — Ambulatory Visit (INDEPENDENT_AMBULATORY_CARE_PROVIDER_SITE_OTHER): Payer: BC Managed Care – PPO | Admitting: Physician Assistant

## 2022-02-01 VITALS — BP 120/80 | HR 89 | Temp 98.1°F | Ht 66.5 in | Wt 194.5 lb

## 2022-02-01 DIAGNOSIS — R531 Weakness: Secondary | ICD-10-CM

## 2022-02-01 DIAGNOSIS — R42 Dizziness and giddiness: Secondary | ICD-10-CM

## 2022-02-01 DIAGNOSIS — R251 Tremor, unspecified: Secondary | ICD-10-CM | POA: Diagnosis not present

## 2022-02-01 LAB — CBC WITH DIFFERENTIAL/PLATELET
Basophils Absolute: 0 10*3/uL (ref 0.0–0.1)
Basophils Relative: 0.7 % (ref 0.0–3.0)
Eosinophils Absolute: 0.1 10*3/uL (ref 0.0–0.7)
Eosinophils Relative: 1.2 % (ref 0.0–5.0)
HCT: 38.8 % (ref 36.0–46.0)
Hemoglobin: 13.2 g/dL (ref 12.0–15.0)
Lymphocytes Relative: 43.6 % (ref 12.0–46.0)
Lymphs Abs: 2.4 10*3/uL (ref 0.7–4.0)
MCHC: 34 g/dL (ref 30.0–36.0)
MCV: 84.7 fl (ref 78.0–100.0)
Monocytes Absolute: 0.4 10*3/uL (ref 0.1–1.0)
Monocytes Relative: 6.9 % (ref 3.0–12.0)
Neutro Abs: 2.6 10*3/uL (ref 1.4–7.7)
Neutrophils Relative %: 47.6 % (ref 43.0–77.0)
Platelets: 213 10*3/uL (ref 150.0–400.0)
RBC: 4.58 Mil/uL (ref 3.87–5.11)
RDW: 12.8 % (ref 11.5–15.5)
WBC: 5.5 10*3/uL (ref 4.0–10.5)

## 2022-02-01 LAB — COMPREHENSIVE METABOLIC PANEL
ALT: 16 U/L (ref 0–35)
AST: 14 U/L (ref 0–37)
Albumin: 4.3 g/dL (ref 3.5–5.2)
Alkaline Phosphatase: 37 U/L — ABNORMAL LOW (ref 39–117)
BUN: 13 mg/dL (ref 6–23)
CO2: 28 mEq/L (ref 19–32)
Calcium: 9.7 mg/dL (ref 8.4–10.5)
Chloride: 107 mEq/L (ref 96–112)
Creatinine, Ser: 0.83 mg/dL (ref 0.40–1.20)
GFR: 87.24 mL/min (ref >60.00)
Glucose, Bld: 95 mg/dL (ref 70–99)
Potassium: 4.4 mEq/L (ref 3.5–5.1)
Sodium: 140 mEq/L (ref 135–145)
Total Bilirubin: 0.2 mg/dL (ref 0.2–1.2)
Total Protein: 7.7 g/dL (ref 6.0–8.3)

## 2022-02-01 LAB — CK: Total CK: 52 U/L (ref 7–177)

## 2022-02-01 LAB — C-REACTIVE PROTEIN: CRP: <1 mg/dL (ref 0.5–20.0)

## 2022-02-01 LAB — SEDIMENTATION RATE: Sed Rate: 13 mm/hr (ref 0–20)

## 2022-02-01 NOTE — Progress Notes (Signed)
Nancy [name] is a 41 y.o. female here for muscle weakness and dizziness.  History of Present Illness:   Chief Complaint  Patient presents with   muscle weakness   Dizziness    Pt is c/o arm and leg muscle weakness x 3 weeks. Also having dizziness off and on. Nancy [name] thyroid medication was adjusted and thought it was from the medication but Endo said no, told Nancy [name] to take Meclizine for dizziness. Pt tried x 1 week no difference. Called Nancy [name] Neurologist and was told needs new referral to be seen.   Nancy [name] presented to today's visit with Nancy [name] husband, Seymour Bars.   Dizziness   Muscle Weakness  Nancy [name] presents with c/o muscle weakness that has been onset for three weeks. Pt describes this feeling as though Nancy [name] arms are heavy and light at the same time, more so Nancy [name] left arm rather than right, and as though Nancy [name] legs can't support Nancy [name] weight. 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 Within the past two weeks, this has become a constant issue for Nancy [name] and Nancy [name] has not experienced a migraine throughout onset of sx.   Reports that this has become so pronounced that Nancy [name] finds it hard to hold objects at times. Initially Nancy [name] believed this to be exacerbated by recent adjustment of Nancy [name] thyroid medication a few weeks prior but upon contacting Dr. Fransico Michael, endocrinologist this was ruled out. According to pt, Dr. Fransico Michael recommended Nancy [name] f/u with Dr. Lucia Gaskins, neurology for further evaluation. Upon contacting Dr. Lucia Gaskins Nancy [name] was recommended to f/u with Nancy [name] PCP and to have new referral sent over (Nancy [name] currently sees Lucia Gaskins for migraine management.)  Pt is still participating in botox treatments for migraine tx, but does not believe it is a cause for this issue.   Dizziness/Fatigue In addition to muscle weakness, Nancy [name] has also been experiencing dizziness and fatigue for the same amount of time. Although Nancy [name] has  been taking two hour naps throughout the day, Nancy [name] is still not sleeping well. Nancy [name] describes dizziness as lightheadedness and as though the room is spinning. Nancy [name] did contact Nancy [name] endocrinologist about this issue as well and was recommended to take meclizine x 1 week. Although Nancy [name] was compliant with this, Nancy [name] found no improvement of sx. Although this is the case Nancy [name] and Nancy [name] husband, don't believe this issue is as urgent as Nancy [name] muscle weakness.   Denies nausea, vomiting, recent travel, or trauma.   Tremor  According to pt's husband, Nancy [name] has experienced head and bilateral hand tremors occurring. Nancy [name] states that Nancy [name] endocrinologist, Dr. Fransico Michael, has also noted this.   Past Medical History:  Diagnosis Date   Allergy    COVID    Eczema    Fatigue    Hashimoto's disease    Hypothyroidism, acquired, autoimmune    Migraines    Thyroiditis, autoimmune    Hashimotos    Vaginal delivery 2006, 2009     Social History   Tobacco Use   Smoking status: Never   Smokeless tobacco: Never  Vaping Use   Vaping Use: Never used  Substance Use Topics   Alcohol use: No   Drug use: No    Past Surgical History:  Procedure Laterality Date   MOLE REMOVAL  12/13/2008   tongue growth  12/13/2008   WISDOM TOOTH EXTRACTION  12/13/1997    Family History  Problem Relation Age of  Onset   Stroke Mother    Lung cancer Mother    COPD Mother    Diabetes Mellitus II Mother    Anxiety disorder Mother    Cancer Mother    Stroke Maternal Grandmother    Arthritis Maternal Grandmother    Varicose Veins Maternal Grandmother    Heart disease Maternal Grandfather    Hashimoto's thyroiditis Daughter        Hashimoto's 2   Diabetes Son    Hypothyroidism Son    Breast cancer Other    Colon cancer Other    Migraines Neg Hx     Allergies  Allergen Reactions   Codeine Nausea Only    Current Medications:   Current Outpatient Medications:    BOTOX 200 units SOLR, INJECT 155 UNITS INTO HEAD AND NECK MUSCLES  EVERY 3 MONTHS.  DISCARD REMAINDER., Disp: 1 each, Rfl: 0   Calcium-Magnesium-Vitamin D (CITRACAL CALCIUM+D) 600-40-500 MG-MG-UNIT TB24, Take 1 tablet at dinner and 1 tablet at bedtime., Disp: 90 tablet, Rfl: 1   cetirizine (ZYRTEC) 10 MG tablet, Take 10 mg by mouth daily as needed., Disp: , Rfl:    EPINEPHrine 0.3 mg/0.3 mL IJ SOAJ injection, Inject 0.3 mg into the muscle as needed for anaphylaxis., Disp: 2 each, Rfl: 1   levothyroxine (SYNTHROID) 112 MCG tablet, TAKE 2 TABLETS BY MOUTH DAILY (Patient taking differently: TAKE 1 1/2 TABLETS BY MOUTH DAILY), Disp: 180 tablet, Rfl: 1   naproxen (NAPROSYN) 500 MG tablet, Take 500 mg by mouth as needed., Disp: , Rfl:    NUVARING 0.12-0.015 MG/24HR vaginal ring, UNWRAP AND INSERT 1 RING VAGINALLY AND LEAVE IN PLACE FOR 28 DAYS, THEN REPLACE, Disp: 3 each, Rfl: 2   ondansetron (ZOFRAN-ODT) 4 MG disintegrating tablet, DISSOLVE 1 TABLET(4 MG) ON THE TONGUE EVERY 8 HOURS AS NEEDED FOR NAUSEA OR VOMITING, Disp: 30 tablet, Rfl: 11   SUMAtriptan (IMITREX) 100 MG tablet, TAKE 1 TABLET(100 MG) BY MOUTH 1 TIME. MAY REPEAT IN 2 HOURS IF HEADACHE PERSISTS OR RECURS, Disp: 10 tablet, Rfl: 5   traZODone (DESYREL) 50 MG tablet, TAKE 1 TABLET(50 MG) BY MOUTH AT BEDTIME AS NEEDED FOR SLEEP, Disp: 30 tablet, Rfl: 0   TROKENDI XR 100 MG CP24, TAKE ONE CAPSULE BY MOUTH EVERY NIGHT AT BEDTIME WITH 50MG  FOR TOTAL DOSE OF 150MG , Disp: 30 capsule, Rfl: 11   TROKENDI XR 50 MG CP24, TAKE 1 CAPSULE BY MOUTH AT BEDTIME, TAKE WITH TROKENDI 100MG  FOR A TOTAL DOSE OF 150MG , Disp: 30 capsule, Rfl: 2   Review of Systems:   ROS Negative unless otherwise specified per HPI.  Vitals:   Vitals:   02/01/22 1358  BP: 120/80  Pulse: 89  Temp: 98.1 F (36.7 C)  TempSrc: Temporal  SpO2: 99%  Weight: 194 lb 8 oz (88.2 kg)  Height: 5' 6.5" (1.689 m)     Body mass index is 30.92 kg/m.  Physical Exam:   Physical Exam Vitals and nursing note reviewed.  Constitutional:       General: Nancy [name] is not in acute distress.    Appearance: Nancy [name] is well-developed. Nancy [name] is not ill-appearing or toxic-appearing.  Cardiovascular:     Rate and Rhythm: Normal rate and regular rhythm.     Pulses: Normal pulses.     Heart sounds: Normal heart sounds, S1 normal and S2 normal.  Pulmonary:     Effort: Pulmonary effort is normal.     Breath sounds: Normal breath sounds.  Skin:    General: Skin is  warm and dry.  Neurological:     Mental Status: Nancy [name] is alert.     GCS: GCS eye subscore is 4. GCS verbal subscore is 5. GCS motor subscore is 6.     Motor: Weakness present. No atrophy.     Coordination: Coordination is intact.     Comments: UE and LE strength 4/5 No decreased ROM  Psychiatric:        Speech: Speech normal.        Behavior: Behavior normal. Behavior is cooperative.    Assessment and Plan:   Weakness Unclear etiology Will update basic autoimmune work-up and check CK levels today Urgent referral for neurology, back to Dr. Lucia Gaskins, to discuss symptoms and further work-up of this Informed patient I would like to get imaging of Nancy [name] brain, but Nancy [name] prefers to have this worked-up with neurology I asked Nancy [name] to let us know if Nancy [name] does not hear anything by end of week If any worsening, please let us know Follow up as needed   Dizziness Referral to neurology per patient request, declines further work-up at this time I also offered referral to vestibular rehab PT but Nancy [name] declines at this time Continue to monitor  I,Havlyn C Ratchford,acting as a scribe for Jarold Motto, PA.,have documented all relevant documentation on the behalf of Jarold Motto, PA,as directed by  Jarold Motto, PA while in the presence of Jarold Motto, [place].  I, Jarold Motto, [place], have reviewed all documentation for this visit. The documentation on 02/01/22 for the exam, diagnosis, procedures, and orders are all accurate and complete.   Jarold Motto, PA-C

## 2022-02-01 NOTE — Patient Instructions (Signed)
It was great to see you!  Please call Dr. Trevor Mace office and let them know that we have put in an urgent referral.  If you do not not hear anything by end of week, let me know  I would like to get imaging of your brain, but if you'd like to defer to neurology we can  If any worsening, please let us know  Take care,  Jarold Motto PA-C

## 2022-02-02 DIAGNOSIS — G43711 Chronic migraine without aura, intractable, with status migrainosus: Secondary | ICD-10-CM | POA: Diagnosis not present

## 2022-02-02 LAB — ANA: Anti Nuclear Antibody (ANA): NEGATIVE

## 2022-02-03 ENCOUNTER — Ambulatory Visit (INDEPENDENT_AMBULATORY_CARE_PROVIDER_SITE_OTHER): Payer: BC Managed Care – PPO | Admitting: Neurology

## 2022-02-03 ENCOUNTER — Encounter: Payer: Self-pay | Admitting: Neurology

## 2022-02-03 ENCOUNTER — Telehealth: Payer: Self-pay | Admitting: Adult Health

## 2022-02-03 VITALS — Ht 67.0 in | Wt 195.2 lb

## 2022-02-03 DIAGNOSIS — R29898 Other symptoms and signs involving the musculoskeletal system: Secondary | ICD-10-CM

## 2022-02-03 DIAGNOSIS — R202 Paresthesia of skin: Secondary | ICD-10-CM

## 2022-02-03 DIAGNOSIS — M6281 Muscle weakness (generalized): Secondary | ICD-10-CM | POA: Diagnosis not present

## 2022-02-03 DIAGNOSIS — Z79899 Other long term (current) drug therapy: Secondary | ICD-10-CM | POA: Diagnosis not present

## 2022-02-03 DIAGNOSIS — D8989 Other specified disorders involving the immune mechanism, not elsewhere classified: Secondary | ICD-10-CM

## 2022-02-03 DIAGNOSIS — R200 Anesthesia of skin: Secondary | ICD-10-CM

## 2022-02-03 DIAGNOSIS — G1229 Other motor neuron disease: Secondary | ICD-10-CM

## 2022-02-03 DIAGNOSIS — R251 Tremor, unspecified: Secondary | ICD-10-CM

## 2022-02-03 DIAGNOSIS — R42 Dizziness and giddiness: Secondary | ICD-10-CM

## 2022-02-03 DIAGNOSIS — G969 Disorder of central nervous system, unspecified: Secondary | ICD-10-CM

## 2022-02-03 NOTE — Telephone Encounter (Signed)
Received 1 200 unit vial of Botox. 

## 2022-02-03 NOTE — Progress Notes (Signed)
[facility]    Provider:  Dr Jaynee Eagles Referring Provider: Inda Coke, PA Primary Care Physician:  Inda Coke, PA   CC:  episodes of weakness  Interval history 02/03/2022:She started having episodes with migraines 7 years ago, weakness of the arms and legs bilaterally and fogginess. Would be episodic with the migraines or before the migraines and then would go away. She had Covid very hard in June of 2022, she ha all kinds of weird thngs like muscle weakness, dizziness, chest pain, hurt to breathe, went to the ED and had a CT Scan and EKG, she stopped driving. A month after Covid she started having episodes and they would last a week or so, weakness in the arms and legs, numbness and tingling in the arms and legs, shake her hands out because they fall asleep. The episodes come and go. One arm can be worse than the other can be either but usually the right arm, can't even hold a glass of water, can't hold a pen to write, cant hold the steering wheel. Husband here and provides much information. She is also having dizziness, usually arms are weaker than her legs but today her legs are weak, sometimes she has trouble focusing, no ptosis, head tremors, no swallowing difficulties or weakness. In between episodes she is fine. Lots of fatigue, napping a lot. No significant snoring. She is seeing endocrinology for her thyroid and she has spoken to her endocrinologist. No other focal neurologic deficits, associated symptoms, inciting events or modifiable factors.  Patient complains of symptoms per HPI as well as the following symptoms: brain fog . Pertinent negatives and positives per HPI. All others negative    Interval history; Migraines worsened in the fall. Increased to 15 headache days a month for over 6 months, increased Trokendi to 13m and didn't help. January has been better but in the last month she feels improved, still 15 headache days and still having 10 migraine days.  They will start with auras. Imitrex helps.  She wants to try the Ajovy. She is post-menopausal which may be affecting her.    Interval history 01/03/2017: Her headaches are improved and she is doing wll on the Trokendi. She is on Nuvaring and she takes it out every 3 weeks and has a cycle then replaces it. Another option is to replace the Nuvaring every 3 weeks. She gets 3-4 migraines the week of her menses which is why replacing the Nuvaring every 3 weeks may help. No side effects to the Trokendi. These migraines are not as debilitating and the acute management helps. Husband is here and is very happy as well.   Interval history 06/22/2016:  Very nice 41year old patient here for follow-up of migraines. Last seen almost a year ago, verapamil did not help her headaches, however the combination of Imitrex, and antiemetic and anti-inflammatory medication works great for acute management of migraines. She was recently diagnosed with ovarian failure. The migraines have changed since managing her ovarian failure, She gets migraines the week of her menses every day for a week and then a few other migraines the other weeks. Possibly 9-10 migraines a month. Verapamil didn't help. Husband had a vasectomy and she diagnosed with POI so no more kids. She tried Topamax in the past and it made her very tired and she would sleeps through hearing her children in the middle of the night. She is willing to try Topamax again. We discussed all the other options including other classes of medications however Topamax  is a great migraine preventative and she is willing to try again we can try the extended release version which has improved side effects.Discussed other classes of medications and side effects, she decide to try Topamax.   Tried: Topamax. Did not tolerate Topamax IR. Failed verapamil.      HPI:  41 year old female with Migrains. PMHx hypothyroidism due to Hashimoto's Thyroiditis, fatigue and tremor. She has had  migraines since the age of 56. It was a cleaning product that caused it, resolved with changes in environmental factors and since then they come and go. They go in cycles, has them for a while then go away. Worsening the last 3 years. 3 migraines a week now. She tried Topamax and could not tolerate. Took fioricet in the past for acute management. Pain is on the right side of the head. Dizziness. Light headedness. Weakness in the arms after the headaches or during the headaches. Maybe numbness, like they are falling asleep. Left > right side affected. She had a head tremor for a few months that resolved. She had vertigo with her headaches a few weeks ago. She has had 3 occular migraines in her lifetime. The headaches last all day if she wakes up with it. If headache starts is in the afternoon, it may last 6 hours if she sleeps. Throbbing, light and sound sensitivity, nausea but rare vomiting.Dark room staying still helps. 7-8/10 at its worse. No auras. She feels mentally foggy with the migraines. No insomnia. No other focal neurologic deficits. Aunt with Parkinsons Dz.    Reviewed notes, labs and imaging from outside physicians, which showed: CMP 07/2015 unremarkable Creatinine 0.71. CBC nml, TSH nml, LDL 74,    Personally reviewed MRI brain:   Ventricle size normal. Cerebral volume normal. Low lying cerebellar tonsils approximately 5 mm without Chiari malformation. Pituitary normal in size. Negative for acute or chronic infarction  Negative for demyelinating disease. Cerebral white matter is normal. Brainstem and basal ganglia are normal. Negative for hemorrhage or mass lesion. Normal enhancement following contrast infusion.  Paranasal sinuses are clear. IMPRESSION: Normal     Review of Systems: Patient complains of symptoms per HPI as well as the following symptoms: Fatigue, light sensitivity, cold sensitivity, nausea, food allergies, headache, weakness, daytime sleepiness. Pertinent negatives per HPI.  All others negative.   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  Tobacco Use   Smoking status: Never   Smokeless tobacco: Never  Vaping Use   Vaping Use: Never used  Substance and Sexual Activity   Alcohol use: No   Drug use: No   Sexual activity: Yes    Birth control/protection: Post-menopausal, Other-see comments    Comment: nuvaring for hormone replacement  Other Topics Concern   Not on file  Social History Narrative   58 and 85 year old kids   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   Social Determinants of Radio broadcast assistant Strain: Not on file  Food Insecurity: Not on file  Transportation Needs: Not on file  Physical Activity: Not on file  Stress: Not on file  Social Connections: Not on file  Intimate Partner Violence: Not on file    Family History  Problem Relation Age of Onset  Stroke Mother    Lung cancer Mother    COPD Mother    Diabetes Mellitus II Mother    Anxiety disorder Mother    Cancer Mother    Stroke Maternal Grandmother    Arthritis Maternal Grandmother    Varicose Veins Maternal Grandmother    Heart disease Maternal Grandfather    Hashimoto's thyroiditis Daughter        Hashimoto's 2   Diabetes Son    Hypothyroidism Son    Breast cancer Other    Colon cancer Other    Migraines Neg Hx     Past Medical History:  Diagnosis Date   Allergy    COVID    Eczema    Fatigue    Hashimoto's disease    Hypothyroidism, acquired, autoimmune    Migraines    Thyroiditis, autoimmune    Hashimotos    Vaginal delivery 2006, 2009    Past Surgical History:  Procedure Laterality Date   MOLE REMOVAL  12/13/2008   tongue growth  12/13/2008   WISDOM TOOTH EXTRACTION  12/13/1997    Current Outpatient Medications  Medication  Sig Dispense Refill   BOTOX 200 units SOLR INJECT 155 UNITS INTO HEAD AND NECK MUSCLES EVERY 3 MONTHS.  DISCARD REMAINDER. 1 each 0   Calcium-Magnesium-Vitamin D (CITRACAL CALCIUM+D) 600-40-500 MG-MG-UNIT TB24 Take 1 tablet at dinner and 1 tablet at bedtime. 90 tablet 1   cetirizine (ZYRTEC) 10 MG tablet Take 10 mg by mouth daily as needed.     EPINEPHrine 0.3 mg/0.3 mL IJ SOAJ injection Inject 0.3 mg into the muscle as needed for anaphylaxis. 2 each 1   levothyroxine (SYNTHROID) 112 MCG tablet TAKE 2 TABLETS BY MOUTH DAILY (Patient taking differently: TAKE 1 1/2 TABLETS BY MOUTH DAILY) 180 tablet 1   naproxen (NAPROSYN) 500 MG tablet Take 500 mg by mouth as needed.     NUVARING 0.12-0.015 MG/24HR vaginal ring UNWRAP AND INSERT 1 RING VAGINALLY AND LEAVE IN PLACE FOR 28 DAYS, THEN REPLACE 3 each 2   ondansetron (ZOFRAN-ODT) 4 MG disintegrating tablet DISSOLVE 1 TABLET(4 MG) ON THE TONGUE EVERY 8 HOURS AS NEEDED FOR NAUSEA OR VOMITING 30 tablet 11   SUMAtriptan (IMITREX) 100 MG tablet TAKE 1 TABLET(100 MG) BY MOUTH 1 TIME. MAY REPEAT IN 2 HOURS IF HEADACHE PERSISTS OR RECURS 10 tablet 5   traZODone (DESYREL) 50 MG tablet TAKE 1 TABLET(50 MG) BY MOUTH AT BEDTIME AS NEEDED FOR SLEEP 30 tablet 0   TROKENDI XR 100 MG CP24 TAKE ONE CAPSULE BY MOUTH EVERY NIGHT AT BEDTIME WITH 50MG FOR TOTAL DOSE OF 150MG 30 capsule 11   TROKENDI XR 50 MG CP24 TAKE 1 CAPSULE BY MOUTH AT BEDTIME, TAKE WITH TROKENDI 100MG FOR A TOTAL DOSE OF 150MG 30 capsule 2   No current facility-administered medications for this visit.    Allergies as of 02/03/2022 - Review Complete 02/03/2022  Allergen Reaction Noted   Codeine Nausea Only 04/07/2011    Vitals: Ht '5\' 7"'  (1.702 m)    Wt 195 lb 3.2 oz (88.5 kg)    LMP 01/15/2015 (Exact Date) Comment: LMP 2016   BMI 30.57 kg/m  Last Weight:  Wt Readings from Last 1 Encounters:  02/03/22 195 lb 3.2 oz (88.5 kg)   Last Height:   Ht Readings from Last 1 Encounters:  02/03/22 '5\' 7"'   (1.702 m)   Laying/sitting/standing  144/86 132/82 129/83  Orthostatic Pulse 73 73 84    Physical exam: Exam: Gen: NAD, conversant,  well nourised, obese, well groomed                     CV: RRR, no MRG. No Carotid Bruits. No peripheral edema, warm, nontender Eyes: Conjunctivae clear without exudates or hemorrhage  Neuro: Detailed Neurologic Exam  Speech:    Speech is normal; fluent and spontaneous with normal comprehension.  Cognition:    The patient is oriented to person, place, and time;     recent and remote memory intact;     language fluent;     normal attention, concentration,     fund of knowledge Cranial Nerves:    The pupils are equal, round, and reactive to light. The fundi are nflat. Visual fields are full to finger confrontation. Extraocular movements are intact. Trigeminal sensation is intact and the muscles of mastication are normal. The face is symmetric. The palate elevates in the midline. Hearing intact. Voice is normal. Shoulder shrug is normal. The tongue has normal motion without fasciculations.   Coordination:    Normal   Gait:   normal.   Motor Observation:    No asymmetry, no atrophy, and no involuntary movements noted. Tone:    Normal muscle tone.    Posture:    Posture is normal. normal erect    Strength:    Strength is V/V in the upper and lower limbs.      Sensation: intact to LT     Reflex Exam:  DTR's:    Deep tendon reflexes in the upper and lower extremities are normal bilaterally.   Toes:    The toes are downgoing bilaterally.   Clonus:    Clonus is absent.        Assessment/Plan: This is a patient who reports upper and lower extremity weakness, numbness and tingling in her fingers, feelings that her limbs are going to sleep, legs giving out on her, so weak she can even hold the steering wheel or lift her arms, both proximal and distal weakness, also dizziness and fatigue ongoing for 7 years waxing and waning.  - Unclear  etiology, we need to check the MRI of her brain and her cervical spine to make sure she does not have any demyelinating lesion such as multiple sclerosis and that these are not MS exacerbations - highly unlikely seizures because they are bilateral,  - her strength and sensation was largely intact today so I do not think this is muscle disease but we will order an EMG nerve conduction study of the right arm and the right leg.  - I do not think that this is migraine aura without headache because this can last upwards of 3 weeks and that would be highly unusual for a migraine aura.   - She does have problems with her thyroid and autoimmune disease so I will check several labs including an ANA comprehensive panel. -Only abnormality on exam is brisk reflexes which could be normal for patient but could also signify an upper motor neuron lesion. - dizziness due to blood pressure? Hydrate better Laying/sitting/standing  144/86 132/82 129/83  Orthostatic Pulse 73 73 84   - Primary care may consider further cardiac monitoring like holter monitor or echocardiogram - Genetic testing?  Orders Placed This Encounter  Procedures   MR BRAIN W WO CONTRAST   MR CERVICAL SPINE W WO CONTRAST   Lactic acid, plasma   Magnesium   B12 and Folate Panel   Hepatitis C antibody   Heavy metals, blood  Vitamin B6   Multiple Myeloma Panel (SPEP&IFE w/QIG)   Methylmalonic acid, serum   Vitamin B1   Hemoglobin A1c   ANA Comprehensive Panel   Topiramate level   NCV with EMG(electromyography)       Sarina Ill, MD   [facility] [address] [place], [place] [address]   Phone [phone] Fax [phone]   I spent 60 minutes of face-to-face and non-face-to-face time with patient on the  1. Muscle weakness   2. Paresthesias   3. Arm weakness   4. Weakness of lower extremity, unspecified laterality   5. Numbness and tingling in both hands   6. Tremor due to disorder  of central nervous system   7. Dizziness   8. Autoimmune disorder ([facility])   9. 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  This included previsit chart review, lab review, study review, order entry, electronic health record documentation, patient education on the different diagnostic and therapeutic options, counseling and coordination of care, risks and benefits of management, compliance, or risk factor reduction

## 2022-02-03 NOTE — Patient Instructions (Addendum)
MRI of the brain and cervical spine Emg/ncs of right arm and right leg Primary care may consider further cardiac monitoring like holter monitor or echocardiogram Genetic testing?

## 2022-02-04 ENCOUNTER — Ambulatory Visit (INDEPENDENT_AMBULATORY_CARE_PROVIDER_SITE_OTHER): Payer: BC Managed Care – PPO | Admitting: Adult Health

## 2022-02-04 ENCOUNTER — Telehealth: Payer: Self-pay | Admitting: Neurology

## 2022-02-04 ENCOUNTER — Encounter: Payer: Self-pay | Admitting: Adult Health

## 2022-02-04 DIAGNOSIS — G43009 Migraine without aura, not intractable, without status migrainosus: Secondary | ICD-10-CM

## 2022-02-04 NOTE — Progress Notes (Signed)
° ° ° ° ° °  BOTOX PROCEDURE NOTE FOR MIGRAINE HEADACHE    Contraindications and precautions discussed with patient(above). Aseptic procedure was observed and patient tolerated procedure. Procedure performed by Butch Penny, NP  The condition has existed for more than 6 months, and pt does not have a diagnosis of ALS, Myasthenia Gravis or Lambert-Eaton Syndrome.  Risks and benefits of injections discussed and pt agrees to proceed with the procedure.  Written consent obtained  These injections are medically necessary. ]These injections do not cause sedations or hallucinations which the oral therapies may cause.  Indication/Diagnosis: chronic migraine BOTOX(J0585) injection was performed according to protocol by [facility]. 200 units of BOTOX was dissolved into 4 cc NS.   NDC: [address]-95  Type of toxin: Botox Botox- 200 units x 1 vial Lot: [id number] Expiration: 04/2024 NDC: 5784-6962-95   Bacteriostatic 0.9% Sodium Chloride- 98mL total Lot: [id number] Expiration: 07/14/2023 NDC: 4401-0272-53   Dx: G64.403  Description of procedure:  The patient was placed in a sitting position. The standard protocol was used for Botox as follows, with 5 units of Botox injected at each site:   -Procerus muscle, midline injection  -Corrugator muscle, bilateral injection  -Frontalis muscle, bilateral injection, with 2 sites each side, medial injection was performed in the upper one third of the frontalis muscle, in the region vertical from the medial inferior edge of the superior orbital rim. The lateral injection was again in the upper one third of the forehead vertically above the lateral limbus of the cornea, 1.5 cm lateral to the medial injection site.  -Temporalis muscle injection, 4 sites, bilaterally. The first injection was 3 cm above the tragus of the ear, second injection site was 1.5 cm to 3 cm up from the first injection site in line with the tragus of the ear. The third injection site was  1.5-3 cm forward between the first 2 injection sites. The fourth injection site was 1.5 cm posterior to the second injection site.  -Occipitalis muscle injection, 3 sites, bilaterally. The first injection was done one half way between the occipital protuberance and the tip of the mastoid process behind the ear. The second injection site was done lateral and superior to the first, 1 fingerbreadth from the first injection. The third injection site was 1 fingerbreadth superiorly and medially from the first injection site.  -Cervical paraspinal muscle injection, 2 sites, bilateral knee first injection site was 1 cm from the midline of the cervical spine, 3 cm inferior to the lower border of the occipital protuberance. The second injection site was 1.5 cm superiorly and laterally to the first injection site.  -Trapezius muscle injection was performed at 3 sites, bilaterally. The first injection site was in the upper trapezius muscle halfway between the inflection point of the neck, and the acromion. The second injection site was one half way between the acromion and the first injection site. The third injection was done between the first injection site and the inflection point of the neck.   Will return for repeat injection in 3 months.   A 200 unit sof Botox was used, 155 units were injected, the rest of the Botox was wasted. The patient tolerated the procedure well, there were no complications of the above procedure.  Butch Penny, MSN, NP-C 02/04/2022, 7:57 AM [facility] [address] [place], [place] [address] [phone]

## 2022-02-04 NOTE — Progress Notes (Signed)
otox- 200 units x 1 vial Lot: [id number] Expiration: 04/2024 NDC: 6568-1275-17  Bacteriostatic 0.9% Sodium Chloride- 27mL total Lot: [id number] Expiration: 07/14/2023 NDC: 4496-7591-63  Dx: G43.009. S/P

## 2022-02-04 NOTE — Telephone Encounter (Signed)
MR Brain w/wo contrast & MR Cervical spine w/wo contrast Dr. Ihor Dow Josem Kaufmann: [id number] (exp. 02/04/22 to 03/05/22). Patient is scheduled at [facility] for 02/16/22.

## 2022-02-16 ENCOUNTER — Ambulatory Visit (INDEPENDENT_AMBULATORY_CARE_PROVIDER_SITE_OTHER): Payer: BC Managed Care – PPO

## 2022-02-16 DIAGNOSIS — D8989 Other specified disorders involving the immune mechanism, not elsewhere classified: Secondary | ICD-10-CM

## 2022-02-16 DIAGNOSIS — M6281 Muscle weakness (generalized): Secondary | ICD-10-CM

## 2022-02-16 DIAGNOSIS — R202 Paresthesia of skin: Secondary | ICD-10-CM

## 2022-02-16 DIAGNOSIS — Z79899 Other long term (current) drug therapy: Secondary | ICD-10-CM

## 2022-02-16 DIAGNOSIS — R42 Dizziness and giddiness: Secondary | ICD-10-CM | POA: Diagnosis not present

## 2022-02-16 DIAGNOSIS — R251 Tremor, unspecified: Secondary | ICD-10-CM

## 2022-02-16 DIAGNOSIS — G969 Disorder of central nervous system, unspecified: Secondary | ICD-10-CM

## 2022-02-16 DIAGNOSIS — G1229 Other motor neuron disease: Secondary | ICD-10-CM

## 2022-02-16 DIAGNOSIS — R29898 Other symptoms and signs involving the musculoskeletal system: Secondary | ICD-10-CM

## 2022-02-16 DIAGNOSIS — R200 Anesthesia of skin: Secondary | ICD-10-CM

## 2022-02-16 MED ORDER — GADOBENATE DIMEGLUMINE 529 MG/ML IV SOLN
20.0000 mL | Freq: Once | INTRAVENOUS | Status: AC | PRN
  Administered 2022-02-16: 20 mL via INTRAVENOUS

## 2022-02-17 LAB — LACTIC ACID, PLASMA: Lactate, Ven: 8.9 mg/dL (ref 4.8–25.7)

## 2022-02-17 LAB — HEAVY METALS, BLOOD
Arsenic: 3 ug/L (ref 0–9)
Lead, Blood: <1 ug/dL (ref 0.0–3.4)
Mercury: <1 ug/L (ref 0.0–14.9)

## 2022-02-17 LAB — HEMOGLOBIN A1C
Est. average glucose Bld gHb Est-mCnc: 108 mg/dL
Hgb A1c MFr Bld: 5.4 % (ref 4.8–5.6)

## 2022-02-17 LAB — MULTIPLE MYELOMA PANEL, SERUM
Albumin SerPl Elph-Mcnc: 3.9 g/dL (ref 2.9–4.4)
Albumin/Glob SerPl: 1.2 (ref 0.7–1.7)
Alpha 1: 0.3 g/dL (ref 0.0–0.4)
Alpha2 Glob SerPl Elph-Mcnc: 0.9 g/dL (ref 0.4–1.0)
B-Globulin SerPl Elph-Mcnc: 1.1 g/dL (ref 0.7–1.3)
Gamma Glob SerPl Elph-Mcnc: 1.1 g/dL (ref 0.4–1.8)
Globulin, Total: 3.4 g/dL (ref 2.2–3.9)
IgA/Immunoglobulin A, Serum: 165 mg/dL (ref 87–352)
IgG (Immunoglobin G), Serum: 1212 mg/dL (ref 586–1602)
IgM (Immunoglobulin M), Srm: 111 mg/dL (ref 26–217)
Total Protein: 7.3 g/dL (ref 6.0–8.5)

## 2022-02-17 LAB — ANA COMPREHENSIVE PANEL
Anti JO-1: <0.2 AI (ref 0.0–0.9)
Centromere Ab Screen: <0.2 AI (ref 0.0–0.9)
Chromatin Ab SerPl-aCnc: <0.2 AI (ref 0.0–0.9)
ENA RNP Ab: 0.7 AI (ref 0.0–0.9)
ENA SM Ab Ser-aCnc: <0.2 AI (ref 0.0–0.9)
ENA SSA (RO) Ab: 0.2 AI (ref 0.0–0.9)
ENA SSB (LA) Ab: <0.2 AI (ref 0.0–0.9)
Scleroderma (Scl-70) (ENA) Antibody, IgG: <0.2 AI (ref 0.0–0.9)
dsDNA Ab: <1 IU/mL (ref 0–9)

## 2022-02-17 LAB — B12 AND FOLATE PANEL
Folate: 11.5 ng/mL (ref >3.0)
Vitamin B-12: 928 pg/mL (ref 232–1245)

## 2022-02-17 LAB — METHYLMALONIC ACID, SERUM: Methylmalonic Acid: 83 nmol/L (ref 0–378)

## 2022-02-17 LAB — VITAMIN B6: Vitamin B6: 5.1 ug/L (ref 3.4–65.2)

## 2022-02-17 LAB — HEPATITIS C ANTIBODY: Hep C Virus Ab: NONREACTIVE

## 2022-02-17 LAB — MAGNESIUM: Magnesium: 2.2 mg/dL (ref 1.6–2.3)

## 2022-02-17 LAB — TOPIRAMATE LEVEL: Topiramate Lvl: 3.8 ug/mL (ref 2.0–25.0)

## 2022-02-17 LAB — VITAMIN B1: Thiamine: 144.7 nmol/L (ref 66.5–200.0)

## 2022-02-18 DIAGNOSIS — F411 Generalized anxiety disorder: Secondary | ICD-10-CM | POA: Diagnosis not present

## 2022-02-18 NOTE — Telephone Encounter (Signed)
Faxed signed PA form with OV notes to [employer] @ [phone]. ? ?

## 2022-02-23 ENCOUNTER — Other Ambulatory Visit: Payer: Self-pay | Admitting: Adult Health

## 2022-02-24 ENCOUNTER — Other Ambulatory Visit: Payer: Self-pay | Admitting: Adult Health

## 2022-02-25 MED ORDER — BOTOX 200 UNITS IJ SOLR: INTRAMUSCULAR | 0 refills | Status: DC

## 2022-02-25 NOTE — Addendum Note (Signed)
Addended by: Gildardo Griffes on: 02/25/2022 09:46 AM ? ? Modules accepted: Orders ? ?

## 2022-02-25 NOTE — Telephone Encounter (Signed)
Received approval from [place], [place] # [id number] (02/18/2022-01/19/2023). ?

## 2022-02-25 NOTE — Telephone Encounter (Signed)
Please send Botox Rx refill to [facility]. ?

## 2022-03-01 ENCOUNTER — Ambulatory Visit (INDEPENDENT_AMBULATORY_CARE_PROVIDER_SITE_OTHER): Payer: BC Managed Care – PPO | Admitting: Neurology

## 2022-03-01 ENCOUNTER — Other Ambulatory Visit: Payer: Self-pay

## 2022-03-01 DIAGNOSIS — R5383 Other fatigue: Secondary | ICD-10-CM

## 2022-03-01 DIAGNOSIS — R531 Weakness: Secondary | ICD-10-CM

## 2022-03-01 DIAGNOSIS — R202 Paresthesia of skin: Secondary | ICD-10-CM | POA: Diagnosis not present

## 2022-03-01 DIAGNOSIS — G4719 Other hypersomnia: Secondary | ICD-10-CM

## 2022-03-01 DIAGNOSIS — M6281 Muscle weakness (generalized): Secondary | ICD-10-CM

## 2022-03-01 DIAGNOSIS — R200 Anesthesia of skin: Secondary | ICD-10-CM

## 2022-03-01 DIAGNOSIS — R29898 Other symptoms and signs involving the musculoskeletal system: Secondary | ICD-10-CM

## 2022-03-01 NOTE — Progress Notes (Signed)
History: Patient is here to follow up on generalized weakness.  She reports that she has episodes of upper and lower extremity weakness.  She can be so tired that she can even hold the steering wheel or lift her arms, generalized weakness both proximal and distal, and significant fatigue.  Husband is here today and provides much information. ? ?- She needs a consistent nap every day. Loses tone and weakness. Extremely fatigued. Never wakes up refreshed.  We will refer her for sleep evaluation with Dr. Vickey Huger. ESS13, FSS 62 ?-We reviewed MRI of the brain and the cervical spine today, I reviewed images with patient and her husband.  MRI of the brain and cervical spine were both normal.  We also reviewed extensive testing which was normal included lactic acid, magnesium, B12 and folate, hep C, heavy metals, B6, MMA, SPEP and IFE, B1, hemoglobin A1c, ANA comprehensive panel and topiramate level. ?-Highly unlikely seizures because they are bilateral, no alteration of mentation ?-No signs or symptoms of narcolepsy ?-Her strength and sensation intact so I do not think this is a neuromuscular disorder but here today check an EMG nerve conduction study which was normal, reviewed with patient and husband today findings. ?-Very unlikely migraine aura as symptoms can last for weeks and has never been associated with migraine head pain. ?-Highly unlikely myasthenia gravis but we can check antibodies, I will contact her physician and see if we can order them through [facility]. I have placed orders here in our office, she can always come back and have them competed here as well. ? ?Orders Placed This Encounter  ?Procedures  ? Acetylcholine receptor, binding  ? Acetylcholine receptor, blocking  ? Ambulatory referral to Sleep Studies  ? ?No orders of the defined types were placed in this encounter. ? ? ?I spent 30 minutes of face-to-face and non-face-to-face time with patient on the  ?1. Weakness   ?2. Excessive daytime sleepiness    ?3. Other fatigue   ? diagnosis.  This included previsit chart review, lab review, study review, order entry, electronic health record documentation, patient education on the different diagnostic and therapeutic options, counseling and coordination of care, risks and benefits of management, compliance, or risk factor reduction. This does not include time spend on emg/ncs.  ? ?

## 2022-03-02 ENCOUNTER — Encounter: Payer: Self-pay | Admitting: Physician Assistant

## 2022-03-02 ENCOUNTER — Other Ambulatory Visit: Payer: Self-pay | Admitting: Physician Assistant

## 2022-03-02 DIAGNOSIS — R42 Dizziness and giddiness: Secondary | ICD-10-CM

## 2022-03-03 DIAGNOSIS — E063 Autoimmune thyroiditis: Secondary | ICD-10-CM | POA: Diagnosis not present

## 2022-03-04 LAB — TSH: TSH: 1.36 mIU/L

## 2022-03-04 LAB — T4, FREE: Free T4: 1.2 ng/dL (ref 0.8–1.8)

## 2022-03-04 LAB — T3, FREE: T3, Free: 2.7 pg/mL (ref 2.3–4.2)

## 2022-03-05 ENCOUNTER — Other Ambulatory Visit: Payer: Self-pay

## 2022-03-05 ENCOUNTER — Ambulatory Visit (INDEPENDENT_AMBULATORY_CARE_PROVIDER_SITE_OTHER): Payer: BC Managed Care – PPO

## 2022-03-05 ENCOUNTER — Ambulatory Visit (INDEPENDENT_AMBULATORY_CARE_PROVIDER_SITE_OTHER): Payer: BC Managed Care – PPO | Admitting: Cardiology

## 2022-03-05 ENCOUNTER — Encounter: Payer: Self-pay | Admitting: Cardiology

## 2022-03-05 VITALS — BP 128/70 | HR 90 | Ht 67.5 in | Wt 200.6 lb

## 2022-03-05 DIAGNOSIS — R002 Palpitations: Secondary | ICD-10-CM

## 2022-03-05 NOTE — Progress Notes (Unsigned)
Enrolled patient for a 14 day Zio XT  monitor to be mailed to patients home  °

## 2022-03-05 NOTE — Patient Instructions (Signed)
Medication Instructions:  ?Your physician recommends that you continue on your current medications as directed. Please refer to the Current Medication list given to you today. ? ?Testing/Procedures: ?ZIO XT- Long Term Monitor Instructions  ? ?Your physician has requested you wear a ZIO patch monitor for _14__ days.  ?This is a single patch monitor.   [employer] supplies one patch monitor per enrollment. Additional stickers are not available. Please do not apply patch if you will be having a Nuclear Stress Test, Echocardiogram, Cardiac CT, MRI, or Chest Xray during the period you would be wearing the monitor. The patch cannot be worn during these tests. You cannot remove and re-apply the ZIO XT patch monitor.  ?Your ZIO patch monitor will be sent Fed Ex from [employer] directly to your home address. It may take 3-5 days to receive your monitor after you have been enrolled.  ?Once you have received your monitor, please review the enclosed instructions. Your monitor has already been registered assigning a specific monitor serial # to you. ? ?Billing and Patient Assistance Program Information  ? ?We have supplied [employer] with any of your insurance information on file for billing purposes. ?[employer] offers a sliding scale Patient Assistance Program for patients that do not have insurance, or whose insurance does not completely cover the cost of the ZIO monitor.   You must apply for the Patient Assistance Program to qualify for this discounted rate.     To apply, please call [employer] at [phone], select option 4, then select option 2, and ask to apply for Patient Assistance Program.  Meredeth Ide will ask your household income, and how many people are in your household.  They will quote your out-of-pocket cost based on that information.  [employer] will also be able to set up a 15-month, interest-free payment plan if needed. ? ?Applying the monitor  ? ?Shave hair from upper left chest.  ?Hold abrader disc by orange tab.  Rub abrader in 40 strokes over the upper left chest as indicated in your monitor instructions.  ?Clean area with 4 enclosed alcohol pads. Let dry.  ?Apply patch as indicated in monitor instructions. Patch will be placed under collarbone on left side of chest with arrow pointing upward.  ?Rub patch adhesive wings for 2 minutes. Remove white label marked "1". Remove the white label marked "2". Rub patch adhesive wings for 2 additional minutes.  ?While looking in a mirror, press and release button in center of patch. A small green light will flash 3-4 times. This will be your only indicator that the monitor has been turned on. ?  ?Do not shower for the first 24 hours. You may shower after the first 24 hours.  ?Press the button if you feel a symptom. You will hear a small click. Record Date, Time and Symptom in the Patient Logbook.  ?When you are ready to remove the patch, follow instructions on the last 2 pages of the Patient Logbook. Stick patch monitor onto the last page of Patient Logbook.  ?Place Patient Logbook in the blue and white box.  Use locking tab on box and tape box closed securely.  The blue and white box has prepaid postage on it. Please place it in the mailbox as soon as possible. Your physician should have your test results approximately 7 days after the monitor has been mailed back to [facility].  ?Call [facility] at [phone] if you have questions regarding your ZIO XT patch monitor. Call them immediately if you see  an orange light blinking on your monitor.  ?If your monitor falls off in less than 4 days, contact our Monitor department at [phone]. ?If your monitor becomes loose or falls off after 4 days call [employer] at [phone] for suggestions on securing your monitor.? ? ? ?Follow-Up: ?At [facility], you and your health needs are our priority.  As part of our continuing mission to provide you with exceptional heart care, we have created designated Provider  Care Teams.  These Care Teams include your primary Cardiologist (physician) and Advanced Practice Providers (APPs -  Physician Assistants and Nurse Practitioners) who all work together to provide you with the care you need, when you need it. ? ?We recommend signing up for the patient portal called "MyChart".  Sign up information is provided on this After Visit Summary.  MyChart is used to connect with patients for Virtual Visits (Telemedicine).  Patients are able to view lab/test results, encounter notes, upcoming appointments, etc.  Non-urgent messages can be sent to your provider as well.   ?To learn more about what you can do with MyChart, go to [email or web address].   ? ?Your next appointment:   ?AS NEEDED with Dr. Antoine Poche ? ? ? ?

## 2022-03-05 NOTE — Progress Notes (Addendum)
?  ?Cardiology Office Note ? ? ?Date:  04/12/2022  ? ?ID:  Nancy [name], DOB [birth year], MRN [id number] ? ?PCP:  Jarold Motto, PA  ?Cardiologist:   None ?Referring:   ? ?Chief Complaint  ?Patient presents with  ? Fatigue  ?  Weakness  ? ? ?  ?History of Present Illness: ?Nancy [name] is a 41 y.o. female who presents for evaluation of weakness. ? ?She has chronic fatigue and upper and lower extremity weakness and has been seen by neurology.  I did review these records.  I do note that she was mildly orthostatic in the office.  She has extensive blood work ordered.  He had a normal brain MRI.  She is being treated for possible migraines with Botox. ? ?I do note that in June she had a CT of her chest to rule out pulmonary embolism.  There was no pulmonary embolism or other disease identified.  Of note I do not see any suggestion of coronary calcium or aortic atherosclerosis. ? ?She describes 10 years of muscle weakness.  She feels this in her arms and her legs.  She loses fine motor skills and has these weak spells.  Her limbs feel "off".  She has had a couple of severe episodes in September and December.  This was after she had had COVID in June of last year.  She has not had any prior cardiac work-up.  She does have a sleep study pending.  She is not describing chest pressure, neck or arm discomfort.  There is some shortness of breath and some mild orthostatic symptoms. ? ? ?Past Medical History:  ?Diagnosis Date  ? Allergy   ? COVID   ? Eczema   ? Fatigue   ? Hashimoto's disease   ? Hypothyroidism, acquired, autoimmune   ? Migraines   ? Thyroiditis, autoimmune   ? Hashimotos   ? Vaginal delivery 2006, 2009  ? ? ?Past Surgical History:  ?Procedure Laterality Date  ? MOLE REMOVAL  12/13/2008  ? tongue growth  12/13/2008  ? WISDOM TOOTH EXTRACTION  12/13/1997  ? ? ? ?Current Outpatient Medications  ?Medication Sig Dispense Refill  ? Calcium-Magnesium-Vitamin D (CITRACAL CALCIUM+D)  600-40-500 MG-MG-UNIT TB24 Take 1 tablet at dinner and 1 tablet at bedtime. 90 tablet 1  ? cetirizine (ZYRTEC) 10 MG tablet Take 10 mg by mouth daily as needed.    ? EPINEPHrine 0.3 mg/0.3 mL IJ SOAJ injection Inject 0.3 mg into the muscle as needed for anaphylaxis. 2 each 1  ? levothyroxine (SYNTHROID) 112 MCG tablet TAKE 2 TABLETS BY MOUTH DAILY (Patient taking differently: TAKE 1 1/2 TABLETS BY MOUTH DAILY) 180 tablet 1  ? naproxen (NAPROSYN) 500 MG tablet Take 500 mg by mouth as needed.    ? NUVARING 0.12-0.015 MG/24HR vaginal ring UNWRAP AND INSERT 1 RING VAGINALLY AND LEAVE IN PLACE FOR 28 DAYS, THEN REPLACE 3 each 2  ? ondansetron (ZOFRAN-ODT) 4 MG disintegrating tablet DISSOLVE 1 TABLET(4 MG) ON THE TONGUE EVERY 8 HOURS AS NEEDED FOR NAUSEA OR VOMITING 30 tablet 11  ? SUMAtriptan (IMITREX) 100 MG tablet TAKE 1 TABLET(100 MG) BY MOUTH 1 TIME. MAY REPEAT IN 2 HOURS IF HEADACHE PERSISTS OR RECURS 10 tablet 5  ? TROKENDI XR 100 MG CP24 TAKE ONE CAPSULE BY MOUTH EVERY NIGHT AT BEDTIME WITH 50MG  FOR TOTAL DOSE OF 150MG  30 capsule 11  ? TROKENDI XR 50 MG CP24 TAKE 1 CAPSULE BY MOUTH EVERY NIGHT AT BEDTIME.  TAKE WITH 100GM CAPSULES FOR TITAL DOSE OF 150MG  30 capsule 2  ? Botulinum Toxin Type A (BOTOX) 200 units SOLR PROVIDER TO INJECT 155 UNITS INTO HEAD AND NECK MUSCLES EVERY 12 WEEKS.  DISCARD REMAINDER. 1 each 3  ? traZODone (DESYREL) 50 MG tablet TAKE 1 TABLET(50 MG) BY MOUTH AT BEDTIME AS NEEDED FOR SLEEP 30 tablet 0  ? ?No current facility-administered medications for this visit.  ? ? ?Allergies:   Codeine  ? ? ?Social History:  The patient  reports that she has never smoked. She has never used smokeless tobacco. She reports that she does not drink alcohol and does not use drugs.  ? ?Family History:  The patient's family history includes Anxiety disorder in her mother; Arthritis in her maternal grandmother; Breast cancer in an other family member; COPD in her mother; Cancer in her mother; Colon cancer in an  other family member; Diabetes in her son; Diabetes Mellitus II in her mother; Hashimoto's thyroiditis in her daughter; Heart disease in her maternal grandfather; Hypothyroidism in her son; Lung cancer in her mother; Stroke in her maternal grandmother and mother; Varicose Veins in her maternal grandmother.  ? ? ?ROS:  Please see the history of present illness.   Otherwise, review of systems are positive for none.   All other systems are reviewed and negative.  ? ? ?PHYSICAL EXAM: ?VS:  BP 128/70   Pulse 90   Ht 5' 7.5" (1.715 m)   Wt 200 lb 9.6 oz (91 kg)   LMP 01/15/2015 (Exact Date) Comment: LMP 2016  SpO2 98%   BMI 30.95 kg/m?  , BMI Body mass index is 30.95 kg/m?. ?GENERAL:  Well appearing ?HEENT:  Pupils equal round and reactive, fundi not visualized, oral mucosa unremarkable ?NECK:  No jugular venous distention, waveform within normal limits, carotid upstroke brisk and symmetric, no bruits, no thyromegaly ?LYMPHATICS:  No cervical, inguinal adenopathy ?LUNGS:  Clear to auscultation bilaterally ?BACK:  No CVA tenderness ?CHEST:  Unremarkable ?HEART:  PMI not displaced or sustained,S1 and S2 within normal limits, no S3, no S4, no clicks, no rubs, no murmurs ?ABD:  Flat, positive bowel sounds normal in frequency in pitch, no bruits, no rebound, no guarding, no midline pulsatile mass, no hepatomegaly, no splenomegaly ?EXT:  2 plus pulses throughout, no edema, no cyanosis no clubbing ?SKIN:  No rashes no nodules ?NEURO:  Cranial nerves II through XII grossly intact, motor grossly intact throughout ?PSYCH:  Cognitively intact, oriented to person place and time ? ? ? ?EKG:  EKG is ordered today. ?The ekg ordered today demonstrates sinus rhythm, rate 90, axis within normal limits, intervals within normal limits, no acute ST-T wave changes. ? ? ?Recent Labs: ?05/26/2021: B Natriuretic Peptide 13.4 ?02/01/2022: ALT 16; BUN 13; Creatinine, Ser 0.83; Hemoglobin 13.2; Platelets 213.0; Potassium 4.4; Sodium  140 ?02/03/2022: Magnesium 2.2 ?03/03/2022: TSH 1.36  ? ? ?Lipid Panel ?   ?Component Value Date/Time  ? CHOL 178 09/07/2021 1114  ? TRIG 130.0 09/07/2021 1114  ? HDL 65.20 09/07/2021 1114  ? CHOLHDL 3 09/07/2021 1114  ? VLDL 26.0 09/07/2021 1114  ? LDLCALC 87 09/07/2021 1114  ? LDLCALC 72 09/01/2020 1027  ? ?  ? ?Wt Readings from Last 3 Encounters:  ?03/08/22 197 lb 12.8 oz (89.7 kg)  ?03/05/22 200 lb 9.6 oz (91 kg)  ?02/03/22 195 lb 3.2 oz (88.5 kg)  ?  ? ? ?Other studies Reviewed: ?Additional studies/ records that were reviewed today include: Neurology records, labs. ?Review  of the above records demonstrates:  Please see elsewhere in the note.   ? ? ?ASSESSMENT AND PLAN: ? ?Weakness: The patient has episodic weakness.  I do not strongly suspect an arrhythmia but I will have her wear a 2-week monitor.  She was not orthostatic in the office today.  I do not suspect structural heart disease so no further testing is indicated.  It is possible that she f  does have some autonomic dysfunction exacerbated by COVID.  She does have an Scientist, physiologicalApple Watch.  She has a blood pressure cuff.  I asked her to get data when she is having one of her spells if she does not happen to be wearing the monitor at that time.  Further management will be based on the results of all of this. ? ? ?Current medicines are reviewed at length with the patient today.  The patient does not have concerns regarding medicines. ? ?The following changes have been made:  no change ? ?Labs/ tests ordered today include:  ? ?Orders Placed This Encounter  ?Procedures  ? LONG TERM MONITOR (3-14 DAYS)  ? EKG 12-Lead  ? ? ? ?Disposition:   FU with me as needed based on the results of the above ? ? ?Signed, ?Rollene RotundaJames Johnpaul Gillentine, MD  ?04/12/2022 7:31 PM    ?[facility] ? ? ? ?

## 2022-03-06 ENCOUNTER — Encounter: Payer: Self-pay | Admitting: Neurology

## 2022-03-06 NOTE — Procedures (Signed)
? ? ?   ?Full Name: Sesilia Poucher de Klashorst Gender: Female ?MRN #: [id number] Date of Birth: [birth year] ?   ?Visit Date: 03/01/2022 14:38 ?Age: 41 Years ?Examining Physician: Naomie Dean, MD  ?Referring Provider: Jarold Motto, PA ?Primary Care Physician:  Jarold Motto, ? ?History: Generalized weakness ? ?Summary: EMG/NCS was performed on the right upper and right lower extremities. All nerves and muscles (as indicated in the following tables) were within normal limits.   ? ?   ?Conclusion: This is a normal study of the right upper and right lower extremities.  No electrophysiologic evidence for mononeuropathy, polyneuropathy, radiculopathy or muscle disease. ? ?Naomie Dean, M.D. ? ?[facility] ?[address], [address] ?[place], [place] [address] ?Tel: [phone] ?Fax: [phone] ? ?Verbal informed consent was obtained from the patient, patient was informed of potential risk of procedure, including bruising, bleeding, hematoma formation, infection, muscle weakness, muscle pain, numbness, among others. ?   ? ?   ?MNC ?   ?Nerve / Sites Muscle Latency Ref. Amplitude Ref. Rel Amp Segments Distance Velocity Ref. Area  ?  ms ms mV mV %  cm m/s m/s mVms  ?R Median - APB  ?   Wrist APB 3.0 ?4.4 9.5 ?4.0 100 Wrist - APB 7   32.8  ?   Upper arm APB 6.7  9.2  97 Upper arm - Wrist 20 55 ?49 32.8  ?R Ulnar - ADM  ?   Wrist ADM 2.6 ?3.3 11.5 ?6.0 100 Wrist - ADM 7   36.7  ?   B.Elbow ADM 5.3  10.2  88.5 B.Elbow - Wrist 18 66 ?49 36.2  ?   A.Elbow ADM 7.1  10.3  101 A.Elbow - B.Elbow 10 57 ?49 36.0  ?R Peroneal - EDB  ?   Ankle EDB 3.7 ?6.5 4.2 ?2.0 100 Ankle - EDB 9   14.2  ?   Fib head EDB 10.6  4.2  101 Fib head - Ankle 30 44 ?44 15.1  ?   Pop fossa EDB 12.8  4.2  99.9 Pop fossa - Fib head 10 44 ?44 15.7  ?       Pop fossa - Ankle      ?R Tibial - AH  ?   Ankle AH 3.0 ?5.8 7.8 ?4.0 100 Ankle - AH 9   24.7  ?   Pop fossa AH 12.0  6.2  79 Pop fossa - Ankle 40 44 ?41 24.3  ?           ?SNC ?   ?Nerve  / Sites Rec. Site Peak Lat Ref.  Amp Ref. Segments Distance Peak Diff Ref.  ?  ms ms ?V ?V  cm ms ms  ?R Sural - Ankle (Calf)  ?   Calf Ankle 3.1 ?4.4 9 ?6 Calf - Ankle 14    ?R Superficial peroneal - Ankle  ?   Lat leg Ankle 2.8 ?4.4 9 ?6 Lat leg - Ankle 14    ?R Median, Ulnar - Transcarpal comparison  ?   Median Palm Wrist 1.8 ?2.2 102 ?35 Median Palm - Wrist 8    ?   Ulnar Palm Wrist 2.2 ?2.2 21 ?12 Ulnar Palm - Wrist 8    ?      Median Palm - Ulnar Palm  -0.4 ?0.4  ?R Median - Orthodromic (Dig II, Mid palm)  ?   Dig II Wrist 2.8 ?3.4 19 ?10 Dig II - Wrist 13    ?R  Ulnar - Orthodromic, (Dig V, Mid palm)  ?   Dig V Wrist 2.9 ?3.1 12 ?5 Dig V - Wrist 11    ?             ?F  Wave ?   ?Nerve F Lat Ref.  ? ms ms  ?R Tibial - AH 52.0 ?56.0  ?R Ulnar - ADM 25.9 ?32.0  ?       ?EMG Summary Table   ? Spontaneous MUAP Recruitment  ?Muscle IA Fib PSW Fasc Other Amp Dur. Poly Pattern  ?R. Biceps brachii Normal None None None _______ Normal Normal Normal Normal  ?R. Deltoid Normal None None None _______ Normal Normal Normal Normal  ?R. Triceps brachii Normal None None None _______ Normal Normal Normal Normal  ?R. Pronator teres Normal None None None _______ Normal Normal Normal Normal  ?R. First dorsal interosseous Normal None None None _______ Normal Normal Normal Normal  ?R. Thoracic paraspinals (mid) Normal None None None _______ Normal Normal Normal Normal  ?R. Iliopsoas Normal None None None _______ Normal Normal Normal Normal  ?R. Vastus medialis Normal None None None _______ Normal Normal Normal Normal  ?R. Gastrocnemius (Medial head) Normal None None None _______ Normal Normal Normal Normal  ?R. Extensor hallucis longus Normal None None None _______ Normal Normal Normal Normal  ?R. Tibialis anterior Normal None None None _______ Normal Normal Normal Normal  ? ?

## 2022-03-06 NOTE — Progress Notes (Signed)
? ? ?   ?Full Name: Kendallyn Van de Klashorst Gender: Female ?MRN #: [id number] Date of Birth: 06/10/1981 ?   ?Visit Date: 03/01/2022 14:38 ?Age: 41 Years ?Examining Physician: Raetta Agostinelli, MD  ?Referring Provider: Worley, Samantha, PA ?Primary Care Physician:  Worley, Samantha, ? ?History: Generalized weakness ? ?Summary: EMG/NCS was performed on the right upper and right lower extremities. All nerves and muscles (as indicated in the following tables) were within normal limits.   ? ?   ?Conclusion: This is a normal study of the right upper and right lower extremities.  No electrophysiologic evidence for mononeuropathy, polyneuropathy, radiculopathy or muscle disease. ? ?Lira Stephen, M.D. ? ?[facility] ?[address], [address] ?[place], [place] [address] ?Tel: [phone] ?Fax: [phone] ? ?Verbal informed consent was obtained from the patient, patient was informed of potential risk of procedure, including bruising, bleeding, hematoma formation, infection, muscle weakness, muscle pain, numbness, among others. ?   ? ?   ?MNC ?   ?Nerve / Sites Muscle Latency Ref. Amplitude Ref. Rel Amp Segments Distance Velocity Ref. Area  ?  ms ms mV mV %  cm m/s m/s mVms  ?R Median - APB  ?   Wrist APB 3.0 ?4.4 9.5 ?4.0 100 Wrist - APB 7   32.8  ?   Upper arm APB 6.7  9.2  97 Upper arm - Wrist 20 55 ?49 32.8  ?R Ulnar - ADM  ?   Wrist ADM 2.6 ?3.3 11.5 ?6.0 100 Wrist - ADM 7   36.7  ?   B.Elbow ADM 5.3  10.2  88.5 B.Elbow - Wrist 18 66 ?49 36.2  ?   A.Elbow ADM 7.1  10.3  101 A.Elbow - B.Elbow 10 57 ?49 36.0  ?R Peroneal - EDB  ?   Ankle EDB 3.7 ?6.5 4.2 ?2.0 100 Ankle - EDB 9   14.2  ?   Fib head EDB 10.6  4.2  101 Fib head - Ankle 30 44 ?44 15.1  ?   Pop fossa EDB 12.8  4.2  99.9 Pop fossa - Fib head 10 44 ?44 15.7  ?       Pop fossa - Ankle      ?R Tibial - AH  ?   Ankle AH 3.0 ?5.8 7.8 ?4.0 100 Ankle - AH 9   24.7  ?   Pop fossa AH 12.0  6.2  79 Pop fossa - Ankle 40 44 ?41 24.3  ?           ?SNC ?   ?Nerve  / Sites Rec. Site Peak Lat Ref.  Amp Ref. Segments Distance Peak Diff Ref.  ?  ms ms ?V ?V  cm ms ms  ?R Sural - Ankle (Calf)  ?   Calf Ankle 3.1 ?4.4 9 ?6 Calf - Ankle 14    ?R Superficial peroneal - Ankle  ?   Lat leg Ankle 2.8 ?4.4 9 ?6 Lat leg - Ankle 14    ?R Median, Ulnar - Transcarpal comparison  ?   Median Palm Wrist 1.8 ?2.2 102 ?35 Median Palm - Wrist 8    ?   Ulnar Palm Wrist 2.2 ?2.2 21 ?12 Ulnar Palm - Wrist 8    ?      Median Palm - Ulnar Palm  -0.4 ?0.4  ?R Median - Orthodromic (Dig II, Mid palm)  ?   Dig II Wrist 2.8 ?3.4 19 ?10 Dig II - Wrist 13    ?R   Ulnar - Orthodromic, (Dig V, Mid palm)  ?   Dig V Wrist 2.9 ?3.1 12 ?5 Dig V - Wrist 11    ?             ?F  Wave ?   ?Nerve F Lat Ref.  ? ms ms  ?R Tibial - AH 52.0 ?56.0  ?R Ulnar - ADM 25.9 ?32.0  ?       ?EMG Summary Table   ? Spontaneous MUAP Recruitment  ?Muscle IA Fib PSW Fasc Other Amp Dur. Poly Pattern  ?R. Biceps brachii Normal None None None _______ Normal Normal Normal Normal  ?R. Deltoid Normal None None None _______ Normal Normal Normal Normal  ?R. Triceps brachii Normal None None None _______ Normal Normal Normal Normal  ?R. Pronator teres Normal None None None _______ Normal Normal Normal Normal  ?R. First dorsal interosseous Normal None None None _______ Normal Normal Normal Normal  ?R. Thoracic paraspinals (mid) Normal None None None _______ Normal Normal Normal Normal  ?R. Iliopsoas Normal None None None _______ Normal Normal Normal Normal  ?R. Vastus medialis Normal None None None _______ Normal Normal Normal Normal  ?R. Gastrocnemius (Medial head) Normal None None None _______ Normal Normal Normal Normal  ?R. Extensor hallucis longus Normal None None None _______ Normal Normal Normal Normal  ?R. Tibialis anterior Normal None None None _______ Normal Normal Normal Normal  ? ?

## 2022-03-07 NOTE — Progress Notes (Signed)
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 ? ?HPI: [name] is a 41 y.o. Caucasian woman. She was accompanied by her husband. ? ?1. [name] had her initial adult endocrine consultation with me on 02/03/11: ? A. She was diagnosed with hypothyroidism due to Hashimoto's Thyroiditis in about September 2009, 6-7 months after the birth of her second child. She was started on levothyroxine.  ? B. When I saw her for that initial visit, she felt better on her Synthroid dose of 50 mcg/day on even-numbered days and 75 mcg/day on odd-numbered days, but was still cold. She had a 20+ gram gland. The remainder of her exam was normal ? ?2. Clinical course: During the past ten years we have dealt with several issues: ? A. We have gradually increased her dose of Synthroid/levothyroxine in order to attain a TSH goal of 1.0-2.0. Her symptoms of coldness and fatigue have improved, but not totally resolved.  ? B. Her weight has fluctuated during this time.  ? C. Her last regular menstrual period was on July 20th 2016. 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  ? D. In mid-February she had an anaphylactic reaction, possibly to keto bread, but she isn't sure. She now has an epipen. She had seen an allergist two weeks prior and her allergy tests were negative. Tests for alpha-gal allergy are pending.  ? ?3. Her last clinic visit was on 09/27/21. At that visit I continued her Synthroid dosage of two of the 112 mcg tablets per day. I continued her Citracal/D, 600/400, three times daily and added one Tums at bedtime. However, after reviewing her lab results from 01/04/22, I reduced her levothyroxine dosage to 1.5 of the 112 mcg daily.  ? A. In the interim she has not been feeling well since her last visit. Her brain fog was essentially bad mor several months.  She had been fairly healthy until this Spring.  ?  1). In Late May or early June 2022 she developed covid-19. She really felt bad and was severely fatigued. She had to go to the ED several times due to having problems breathing, chest pain, and severe brain fog. She also lost some fine motor skills and developed severe hand tremors that she could not type or hold things.Her pre-covid baseline brain fog worsened dramatically and frighteningly. She had severe brain fog with trouble thinking and remembering. At one point she was in her bathroom and could not figure out how to get out of the room. She lost a lot of memory for work tasks and other functional issues. She was not able to drive or to work for about 4-6 weeks.  She still had problems with brain fog, concentration, and memory, but not as severe. She was doing better, but has only recovered to about the 70% of her pre-covid functioning. She worked part-time at 20 hours per week. She is also a full-time wife and mother. ? ` 2). Since her visit in October she has had several bouts of brain fog, dizziness, and muscle weakness and pain at times. Her overall neurologic function declined. She could not write, hold a pen, remember how to do simple work tasks, she could not drive, she had to use two hands to hold a glass of water. She saw Dr. Lucia Gaskins in neurology. Her EMG was reportedly normal. She had many lab tests  done, all of which were normal. She had some cognitive therapy to help her work around her disabilities. She will have tests for myasthenia gravis today. Ironically, today she feels pretty good. Her brain fog is "not substantial". She still feels tired, but better than several weeks ago. Her muscle weakness is still present, but stable now. She is able to write, drive, and hold a glass of water.  ?  3). Nancy [name]'s migraines have been "really good" after she was treated with botox. She is now having migraines only 1-2 times per month.  She continues botox  treatments every 3 months.  ?  4). She is still having leg cramps and muscle spasms, but better recently.       ?  5). She has been sleeping better with trazodone. She still needs to nap for 1-2 hours per day  ?  6). She stopped doing yoga when her migraines got bad. She stopped walking a mile every morning. Her back is not bothering her much. She still can't stand long enough to prepare a meal.  ?  7). Her diet hasn't been as consistent. She is cooking less at home and eating out more.  ? B. She remains on 1.5 of the Synthroid 112 mcg tablets per day. She still takes Citracal-D two-three daily and trazodone at bedtime.  ? C. She saw a cardiologist on 03/05/22. She will wear a cardiac monitor for 14 days.  ?  ?4. Pertinent Review of Systems: ?Constitutional. Nancy [name] feels "better than I have in the last 10 weeks".   ?Energy: Energy level is a bit better.    ?Body temperature: She is still very "cold natured".  ?Weight: She has re-gained 9 pounds.    ?Eyes: Her vision is good, but she has more issues with presbyopia. She had an eye exam in September 2022. There are no problems with soreness, bulging, or limited range of eye movements.   ?Neck: She is not aware of any problems relating to the anterior neck and thyroid bed. There have been no significant problems with swelling, pain, soreness, tenderness, pressure, discomfort, or difficulty swallowing. ?Heart: She sometimes has brief palpitations lasting "a couple of seconds". She feels the expected increase in heart rate during exercise or other physical activities. There have been no significant problems with palpitations, irregular heart beats, chest pain, or chest pressure. ?Gastrointestinal: She thinks that she has dairy intolerance. She does not have postprandial bloating. Bowel movements are normal. There are no significant complaints of excessive hunger, acid reflux, upset stomach, stomach aches or pains, diarrhea, or constipation. ?Hands: There is some tremor  when she has been using her hands for awhile. She is not having problems with sweaty palms or palmar erythema.  ?Legs: She has fewer leg cramps. No edema.  ?Psychological: She is tired of being sick for many months, but she is "okay" overall. She still sees her counselor once a month. ?Mental: Her brain fog today much less. She feels pretty clear today, but the brain fog tends to increase in the afternoons. She is doing better now, but need to take more concerted effort to keep herself organized.  ?GYN: She has premature ovarian failure. ? ?PAST MEDICAL, FAMILY, AND SOCIAL HISTORY: ?1. 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  The family still lives in [place], [place], where her husband, Seymour Bars, is a Optician, dispensing. Her sister was also diagnosed with hypothyroidism secondary to Hashimoto's thyroiditis. She has a grand aunt  who had Parkinson's Dz. [Addendum 02/19/11: Her paternal aunt was recently diagnosed with Hashimoto's disease.] ?2. Activities: She has not been walking daily.    ?3. Tobacco, alcohol, and drugs: None ?4. PCP: Ms. Nancy MottoSamantha Worley, PA ?5. Neurology: [facility] Naomie DeanAntonia Ahern, [facility] ? ?REVIEW OF SYSTEMS: [name] has no other significant issues involving her other body systems. ? ?PHYSICAL EXAM: ?BP 120/78 (BP Location: Right Arm, Patient Position: Sitting, Cuff Size: Normal)   Pulse 80   Wt 197 lb 12.8 oz (89.7 kg)   LMP 01/15/2015 (Exact Date) Comment: LMP 2016  BMI 30.52 kg/m?  ? ?Wt Readings from Last 3 Encounters:  ?03/08/22 197 lb 12.8 oz (89.7 kg)  ?03/05/22 200 lb 9.6 oz (91 kg)  ?02/03/22 195 lb 3.2 oz (88.5 kg)  ? ? ?Ht Readings from Last 3 Encounters:  ?03/05/22 5' 7.5" (1.715 m)  ?02/03/22 5\' 7"  (1.702 m)  ?02/01/22 5' 6.5" (1.689 m)  ? ?Constitutional: The patient looks healthy today, much better than I had expected. . She has re-gained 5 pounds. She is bright and alert. Her affect and insight are normal.   ?Head: I do not detect any head  tremor today.    ?Eyes: There is no arcus or proptosis.  ?Mouth: The oropharynx appears normal. The tongue appears normal. There is normal oral moisture. There is no obvious gingivitis. There is no oral  hype

## 2022-03-08 ENCOUNTER — Ambulatory Visit (INDEPENDENT_AMBULATORY_CARE_PROVIDER_SITE_OTHER): Payer: BC Managed Care – PPO | Admitting: "Endocrinology

## 2022-03-08 ENCOUNTER — Other Ambulatory Visit: Payer: Self-pay

## 2022-03-08 ENCOUNTER — Other Ambulatory Visit (INDEPENDENT_AMBULATORY_CARE_PROVIDER_SITE_OTHER): Payer: Self-pay

## 2022-03-08 ENCOUNTER — Encounter (INDEPENDENT_AMBULATORY_CARE_PROVIDER_SITE_OTHER): Payer: Self-pay | Admitting: "Endocrinology

## 2022-03-08 VITALS — BP 120/78 | HR 80 | Wt 197.8 lb

## 2022-03-08 DIAGNOSIS — M6281 Muscle weakness (generalized): Secondary | ICD-10-CM

## 2022-03-08 DIAGNOSIS — E063 Autoimmune thyroiditis: Secondary | ICD-10-CM | POA: Diagnosis not present

## 2022-03-08 DIAGNOSIS — U099 Post covid-19 condition, unspecified: Secondary | ICD-10-CM

## 2022-03-08 DIAGNOSIS — R531 Weakness: Secondary | ICD-10-CM | POA: Diagnosis not present

## 2022-03-08 DIAGNOSIS — R231 Pallor: Secondary | ICD-10-CM

## 2022-03-08 DIAGNOSIS — E049 Nontoxic goiter, unspecified: Secondary | ICD-10-CM

## 2022-03-08 DIAGNOSIS — E2839 Other primary ovarian failure: Secondary | ICD-10-CM | POA: Diagnosis not present

## 2022-03-08 DIAGNOSIS — R251 Tremor, unspecified: Secondary | ICD-10-CM

## 2022-03-08 DIAGNOSIS — R002 Palpitations: Secondary | ICD-10-CM | POA: Diagnosis not present

## 2022-03-08 DIAGNOSIS — Z0289 Encounter for other administrative examinations: Secondary | ICD-10-CM

## 2022-03-08 DIAGNOSIS — R2990 Unspecified symptoms and signs involving the nervous system: Secondary | ICD-10-CM | POA: Diagnosis not present

## 2022-03-08 DIAGNOSIS — E559 Vitamin D deficiency, unspecified: Secondary | ICD-10-CM

## 2022-03-08 DIAGNOSIS — R5383 Other fatigue: Secondary | ICD-10-CM

## 2022-03-08 NOTE — Patient Instructions (Signed)
Follow up visit in 3 months. Please obtain blood tests about one week earlier.  ? ?At [facility], we are committed to providing exceptional care. You will receive a patient satisfaction survey through text or email regarding your visit today. Your opinion is important to me. Comments are appreciated. ? ?

## 2022-03-11 LAB — ACETYLCHOLINE RECEPTOR, BLOCKING: Acetylchol Block Ab: 19 % (ref 0–25)

## 2022-03-11 LAB — ACETYLCHOLINE RECEPTOR, BINDING: AChR Binding Ab, Serum: 0.04 nmol/L (ref 0.00–0.24)

## 2022-03-18 ENCOUNTER — Encounter: Payer: BC Managed Care – PPO | Admitting: Neurology

## 2022-03-23 DIAGNOSIS — F411 Generalized anxiety disorder: Secondary | ICD-10-CM | POA: Diagnosis not present

## 2022-03-25 ENCOUNTER — Other Ambulatory Visit: Payer: Self-pay | Admitting: Neurology

## 2022-03-27 DIAGNOSIS — R002 Palpitations: Secondary | ICD-10-CM | POA: Diagnosis not present

## 2022-04-08 MED ORDER — BOTOX 200 UNITS IJ SOLR: INTRAMUSCULAR | 3 refills | Status: DC

## 2022-04-08 NOTE — Addendum Note (Signed)
Addended by: Bertram Savin on: 04/08/2022 03:33 PM ? ? Modules accepted: Orders ? ?

## 2022-04-08 NOTE — Telephone Encounter (Signed)
Please send Botox RX to [facility]. ?

## 2022-04-12 ENCOUNTER — Encounter: Payer: Self-pay | Admitting: Cardiology

## 2022-04-13 ENCOUNTER — Encounter: Payer: Self-pay | Admitting: Cardiology

## 2022-04-20 ENCOUNTER — Encounter (INDEPENDENT_AMBULATORY_CARE_PROVIDER_SITE_OTHER): Payer: Self-pay | Admitting: "Endocrinology

## 2022-04-20 DIAGNOSIS — F411 Generalized anxiety disorder: Secondary | ICD-10-CM | POA: Diagnosis not present

## 2022-04-21 ENCOUNTER — Telehealth (INDEPENDENT_AMBULATORY_CARE_PROVIDER_SITE_OTHER): Payer: Self-pay

## 2022-04-21 DIAGNOSIS — E063 Autoimmune thyroiditis: Secondary | ICD-10-CM

## 2022-04-21 MED ORDER — LEVOTHYROXINE SODIUM 112 MCG PO TABS: ORAL_TABLET | ORAL | 3 refills | Status: DC

## 2022-04-21 NOTE — Telephone Encounter (Signed)
Pt requested refill of levothyroxine. It has been sent in ?

## 2022-04-23 ENCOUNTER — Other Ambulatory Visit: Payer: Self-pay | Admitting: Neurology

## 2022-04-26 ENCOUNTER — Ambulatory Visit (INDEPENDENT_AMBULATORY_CARE_PROVIDER_SITE_OTHER): Payer: BC Managed Care – PPO | Admitting: Neurology

## 2022-04-26 ENCOUNTER — Encounter: Payer: Self-pay | Admitting: Neurology

## 2022-04-26 VITALS — BP 114/76 | HR 92 | Ht 67.0 in | Wt 200.5 lb

## 2022-04-26 DIAGNOSIS — R5383 Other fatigue: Secondary | ICD-10-CM | POA: Diagnosis not present

## 2022-04-26 DIAGNOSIS — D8989 Other specified disorders involving the immune mechanism, not elsewhere classified: Secondary | ICD-10-CM

## 2022-04-26 DIAGNOSIS — G4719 Other hypersomnia: Secondary | ICD-10-CM | POA: Diagnosis not present

## 2022-04-26 DIAGNOSIS — G43711 Chronic migraine without aura, intractable, with status migrainosus: Secondary | ICD-10-CM

## 2022-04-26 DIAGNOSIS — R29898 Other symptoms and signs involving the musculoskeletal system: Secondary | ICD-10-CM

## 2022-04-26 DIAGNOSIS — M6281 Muscle weakness (generalized): Secondary | ICD-10-CM | POA: Diagnosis not present

## 2022-04-26 DIAGNOSIS — R442 Other hallucinations: Secondary | ICD-10-CM

## 2022-04-26 DIAGNOSIS — G4753 Recurrent isolated sleep paralysis: Secondary | ICD-10-CM

## 2022-04-26 NOTE — Patient Instructions (Signed)
Hypersomnia ?Hypersomnia is a condition in which a person feels very tired during the day even though the person gets plenty of sleep at night. A person with this condition may take naps during the day and may find it very difficult to wake up from sleep. ?Hypersomnia may affect a person's ability to think, concentrate, drive, or remember things. ?What are the causes? ?The cause of this condition may not be known. Possible causes include: ?Taking certain medicines. ?Using drugs or alcohol. ?Sleep disorders, such as narcolepsy and sleep apnea. ?Injury to the head, brain, or spinal cord. ?Tumors. ?Certain medical conditions. These include: ?Depression. ?Diabetes. ?Gastroesophageal reflux disease (GERD). ?An underactive thyroid gland (hypothyroidism). ?What are the signs or symptoms? ?The main symptoms of hypersomnia include: ?Feeling very tired throughout the day, regardless of how much sleep you got the night before. ?Having trouble waking up. Others may find it difficult to wake you up when you are sleeping. ?Sleeping for longer and longer periods at a time. ?Taking naps throughout the day. ?Other symptoms may include: ?Feeling restless, anxious, or annoyed. ?Lacking energy. ?Having trouble with: ?Remembering. ?Speaking. ?Thinking. ?Loss of appetite. ?Seeing, hearing, tasting, smelling, or feeling things that are not real (hallucinations). ?How is this diagnosed? ?This condition may be diagnosed based on: ?Your symptoms and medical history. ?Your sleeping habits. Your health care provider may ask you to write down your sleeping habits in a daily sleep log, along with any symptoms you have. ?A series of tests that are done while you sleep (sleep study or polysomnogram). ?A test that measures how quickly you can fall asleep during the day (daytime nap study or multiple sleep latency test). ?How is this treated? ?This condition may be treated by: ?Following a regular sleep routine. ?Making lifestyle changes, such as  changing your eating habits, getting regular exercise, and avoiding alcohol or caffeinated beverages. ?Taking medicines to make you more alert (stimulants) during the day. ?Treating any underlying medical causes of hypersomnia. ?Follow these instructions at home: ?Sleep habits ?Stick to a routine that includes going to bed and waking up at the same times every day and night. ?Practice a relaxing bedtime routine. This may include reading, meditation, deep breathing, or taking a warm bath before going to sleep. ?Exercise regularly as told by your health care provider. However, avoid exercising in the hours right before bedtime. ?Keep your sleep environment at a cooler temperature, darkened, and quiet. ?Sleep with pillows and a mattress that are comfortable and supportive. ?Schedule short 20-minute naps for when you feel sleepiest during the day. ?Talk with your employer or teachers about your hypersomnia. If possible, adjust your schedule so that: ?You have a regular daytime work schedule. ?You can take a scheduled nap during the day. ?You do not have to work or be active at night. ?Do not eat a heavy meal for a few hours before bedtime. Eat your meals at about the same times every day. ?Safety ? ?Do not drive or use machinery if you are sleepy. Ask your health care provider if it is safe for you to drive. ?Wear a life jacket when swimming or spending time near water. ?General instructions ? ?Take over-the-counter and prescription medicines only as told by your health care provider. This includes supplements. ?Avoid drinking alcohol or caffeinated beverages. ?Keep a sleep log that will help your health care provider manage your condition. This may include information about: ?What time you go to bed each night. ?How often you wake up at night. ?How many hours  you sleep at night. ?How often and for how long you nap during the day. ?Any observations from others, such as leg movements during sleep, sleep walking, or  snoring. ?Keep all follow-up visits. This is important. ?Contact a health care provider if: ?You have new symptoms. ?Your symptoms get worse. ?Get help right away if: ?You have thoughts about hurting yourself or someone else. ?Get help right away if you feel like you may hurt yourself or others, or have thoughts about taking your own life. Go to your nearest emergency room or: ?Call 911. ?Call the National Suicide Prevention Lifeline at (815) 735-5140 or 988. This is open 24 hours a day. ?Text the Crisis Text Line at 719-427-6075. ?Summary ?Hypersomnia refers to a condition in which you feel very tired during the day even though you get plenty of sleep at night. ?A person with this condition may take naps during the day and may find it very difficult to wake up from sleep. ?Hypersomnia may affect a person's ability to think, concentrate, drive, or remember things. ?Treatment may include a regular sleep routine and making some lifestyle changes. ?This information is not intended to replace advice given to you by your health care provider. Make sure you discuss any questions you have with your health care provider. ?Document Revised: 11/09/2021 Document Reviewed: 11/09/2021 ?Elsevier Patient Education ? 2023 [employer] ?Narcolepsy ?Narcolepsy is a neurological disorder that causes people to fall asleep suddenly and without control (have sleep attacks) during the daytime. It is a lifelong disorder. Narcolepsy disrupts the sleep cycle at night, which then causes daytime sleepiness. ?What are the causes? ?The cause of narcolepsy is not fully understood, but it may be related to: ?Low levels of hypocretin, a chemical (neurotransmitter) in the brain that controls sleep and wake cycles. Hypocretin imbalance may be caused by: ?Abnormal genes that are passed from parent to child (inherited). ?An autoimmune disease in which the body's defense system (immune system) attacks the brain cells that make hypocretin. ?Infection, tumor,  or injury in the area of the brain that controls sleep. ?Exposure to poisons (toxins), such as heavy metals, pesticides, and secondhand smoke. ?What are the signs or symptoms? ?Symptoms of this condition include: ?Excessive daytime sleepiness. This is the most common symptom and is usually the first symptom you will notice. This may affect your performance at work or school. ?Sleep attacks. You may fall asleep in the middle of an activity, especially low-energy activities like reading or watching TV. ?Feeling like you cannot think clearly and trouble focusing or remembering things. You may also feel depressed. ?Sudden muscle weakness (cataplexy). When this occurs, your speech may become slurred, or your knees may buckle. Cataplexy is usually triggered by surprise, anger, fear, or laughter. ?Losing the ability to speak or move (sleep paralysis). This may occur just as you start to fall asleep or wake up. You will be aware of the paralysis. It usually lasts for just a few seconds or minutes. ?Seeing, hearing, tasting, smelling, or feeling things that are not real (hallucinations). Hallucinations may occur with sleep paralysis. They can happen when you are falling asleep, waking up, or dozing. ?Trouble staying asleep at night (insomnia) and restless sleep. ?How is this diagnosed? ?This condition may be diagnosed based on: ?A physical exam to rule out any other problems that may be causing your symptoms. You may be asked to write down your sleeping patterns for several weeks in a sleep diary. This will help your health care provider make a diagnosis. ?Sleep  studies that measure how well your REM sleep is regulated. These tests also measure your heart rate, breathing, movement, and brain waves. These tests include: ?An overnight sleep study (polysomnogram). ?A daytime sleep study that is done while you take several naps during the day (multiple sleep latency test, MSLT). This test measures how quickly you fall asleep and  how quickly you enter REM sleep. ?Removal of spinal fluid to measure hypocretin levels. ?How is this treated? ?There is no cure for this condition, but treatment can help relieve symptoms. Treatment may inc

## 2022-04-26 NOTE — Progress Notes (Signed)
? ? ?SLEEP MEDICINE CLINIC ?  ? ?Provider:  Larey Seat, MD  ?Primary Care Physician:  Inda Coke, [place] ?[address] ?[place] [address]  ? ?  ?Referring Provider: Melvenia Beam, Md ?[place] ?[address] ?[place],  [place] [address]  ?  ?  ?    ?Chief Complaint according to patient   ?Patient presents with:  ?  ? New Patient (Initial Visit)  ?     ?  ?  ?HISTORY OF PRESENT ILLNESS:  ?Nancy [name] is a 41 y.o. Caucasian female patient seen here as a referral on 04/26/2022 from Dr Jaynee Eagles, MD. ?   ?Chief concern according to patient :  " am very fatigued, not snoring or not much- I sleep a lot- an my fatigue proceeded Covid infection" ?  ?I have the pleasure of seeing Nancy [name] on 04-26-2022-  a right -handed Caucasian female with a possible sleep disorder.   ?She has a past medical history of Allergy, COVID, Eczema, Fatigue, Hashimoto's disease,-Thyroiditis, autoimmune ( dx at 27) , and Vaginal delivery (2006, 2009). ?  ?The patient had no previous sleep study.  ?Sleep relevant medical history: Nocturia -none, no Tonsillectomy, no TBI.   ?Family medical /sleep history: Father on CPAP with OSA, son with DM1 and insomnia,  ?  ?Social history: Former Academic librarian, Patient is working as a Engineer, manufacturing systems part time- book Haematologist.  and lives in a household with husband an 2 children, 42 and 57 years old.    ?Pets are present.: chicken , cats, dogs and [place] pig and a lizard.  ?Tobacco use- never.   ?ETOH use: none , ? Caffeine intake in form of Coffee( 1 cup I AM ) Soda( /) Tea ( /) . ?Regular exercise; none since COVID infection in Summer 2022. She used to walk a mile and has had regular Yoga before.  Palpitations since COVID. Negative heart monitor.  ? ?Sleep habits are as follows:  ?The patient's dinner time is between 6.30 PM. The patient goes to bed at 10 PM and has problems to stay asleep not going to sleep at all. 50 mg trazodone.  ?continues to sleep in intervals of 1-1.5  hours, wakes rarely for  bathroom breaks, the first time at 1 AM.  No RLS.  ?The preferred sleep position is side ways, with the support of 2 pillows.  ?Dreams are reportedly frequent/vivid.  ?6.30  AM is the usual rise time. The patient wakes up on WE spontaneously at 7.30 ,with a dog- alarm. Weekdays 6.30 alarm.  ?She reports not feeling refreshed or restored in AM, with symptoms such as morning headaches, and residual fatigue. She has usually Migraines in PM.  ? Naps are taken frequently, irresistible urge to sleep- any time in PM. ?These may be lasting from 1 hour onwards  and are not more refreshing than nocturnal sleep. Its hard to wake up from any nap. ?  ?Review of Systems: ?Out of a complete 14 system review, the patient complains of only the following symptoms, and all other reviewed systems are negative.:   cold sensitive, weight loss with thyroiditis, napping since teenage.  ?Fatigue, sleepiness , snoring, HYPERSOMNIA> naps.  ?Leg and arm weakness, heaviness. Loss of grip.  ?  ?How likely are you to doze in the following situations: ?0 = not likely, 1 = slight chance, 2 = moderate chance, 3 = high chance ?  ?Sitting and Reading? ?Watching Television? ?Sitting inactive  in a public place (theater or meeting)? ?As a passenger in a car for an hour without a break? ?Lying down in the afternoon when circumstances permit? ?Sitting and talking to someone? ?Sitting quietly after lunch without alcohol? ?In a car, while stopped for a few minutes in traffic? ?  ?Total = 12-14/ 24 points  ? FSS endorsed at 45/ 63 points.  ? ?Social History  ? ?Socioeconomic History  ? Marital status: Married  ?  Spouse name: Nancy [name]  ? Number of children: 2  ? Years of education: 34  ? Highest education level: Not on file  ?Occupational History  ? 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 Smoking status: Never  ? Smokeless tobacco: Never  ?Vaping Use  ? Vaping Use: Never used  ?Substance and Sexual Activity  ? Alcohol  use: No  ? Drug use: No  ? Sexual activity: Yes  ?  Birth control/protection: Post-menopausal, Other-see comments  ?  Comment: nuvaring for hormone replacement  ?Other Topics Concern  ? Not on file  ?Social History Narrative  ? 18 and 32 year old kids  ? 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 Coffee every morning (12 oz)  ? ?Social Determinants of Health  ? ?Financial Resource Strain: Not on file  ?Food Insecurity: Not on file  ?Transportation Needs: Not on file  ?Physical Activity: Not on file  ?Stress: Not on file  ?Social Connections: Not on file  ? ? ?Family History  ?Problem Relation Age of Onset  ? Stroke Mother   ? Lung cancer Mother   ?     Small cell  ? COPD Mother   ? Diabetes Mellitus II Mother   ? Anxiety disorder Mother   ? Cancer Mother   ? Stroke Maternal Grandmother   ? Arthritis Maternal Grandmother   ? Varicose Veins Maternal Grandmother   ? Heart disease Maternal Grandfather   ? Hashimoto's thyroiditis Daughter   ?     Hashimoto's 2  ? Diabetes Son   ? Hypothyroidism Son   ? Breast cancer Other   ? Colon cancer Other   ? Migraines Neg Hx   ? ? ?Past Medical History:  ?Diagnosis Date  ? Allergy   ? COVID   ? Eczema   ? Fatigue   ? Hashimoto's disease   ? Hypothyroidism, acquired, autoimmune   ? Migraines   ? Thyroiditis, autoimmune   ? Hashimotos   ? Vaginal delivery 2006, 2009  ? ? ?Past Surgical History:  ?Procedure Laterality Date  ? MOLE REMOVAL  12/13/2008  ? tongue growth  12/13/2008  ? WISDOM TOOTH EXTRACTION  12/13/1997  ?  ? ?Current Outpatient Medications on File Prior to Visit  ?Medication Sig Dispense Refill  ? Botulinum Toxin Type A (BOTOX) 200 units SOLR PROVIDER TO INJECT 155 UNITS INTO HEAD AND NECK MUSCLES EVERY 12 WEEKS.  DISCARD REMAINDER. 1 each 3  ? Calcium-Magnesium-Vitamin D (CITRACAL CALCIUM+D) 600-40-500 MG-MG-UNIT TB24 Take 1 tablet at dinner and 1 tablet at bedtime. 90 tablet 1  ? cetirizine (ZYRTEC) 10 MG tablet Take 10 mg by mouth daily as needed.    ?  EPINEPHrine 0.3 mg/0.3 mL IJ SOAJ injection Inject 0.3 mg into the muscle as needed for anaphylaxis. 2 each 1  ? levothyroxine (SYNTHROID) 112 MCG tablet TAKE 1 1/2 TABLETS BY MOUTH DAILY 180 tablet 3  ? naproxen (NAPROSYN) 500 MG tablet Take 500 mg by mouth as needed.    ?  NUVARING 0.12-0.015 MG/24HR vaginal ring UNWRAP AND INSERT 1 RING VAGINALLY AND LEAVE IN PLACE FOR 28 DAYS, THEN REPLACE 3 each 2  ? ondansetron (ZOFRAN-ODT) 4 MG disintegrating tablet DISSOLVE 1 TABLET(4 MG) ON THE TONGUE EVERY 8 HOURS AS NEEDED FOR NAUSEA OR VOMITING 30 tablet 11  ? SUMAtriptan (IMITREX) 100 MG tablet TAKE 1 TABLET(100 MG) BY MOUTH 1 TIME. MAY REPEAT IN 2 HOURS IF HEADACHE PERSISTS OR RECURS 10 tablet 5  ? traZODone (DESYREL) 50 MG tablet TAKE 1 TABLET(50 MG) BY MOUTH AT BEDTIME AS NEEDED FOR SLEEP 30 tablet 0  ? TROKENDI XR 100 MG CP24 TAKE ONE CAPSULE BY MOUTH EVERY NIGHT AT BEDTIME WITH 50MG  FOR TOTAL DOSE OF 150MG  30 capsule 11  ? TROKENDI XR 50 MG CP24 TAKE 1 CAPSULE BY MOUTH EVERY NIGHT AT BEDTIME. TAKE WITH 100GM CAPSULES FOR TITAL DOSE OF 150MG  30 capsule 2  ? ?No current facility-administered medications on file prior to visit.  ? ? ?Allergies  ?Allergen Reactions  ? Codeine Nausea Only  ? ? ?Physical exam: ? ?Today's Vitals  ? 04/26/22 1255  ?BP: 114/76  ?Pulse: 92  ?SpO2: 97%  ?Weight: 200 lb 8 oz (90.9 kg)  ?Height: 5\' 7"  (1.702 m)  ? ?Body mass index is 31.4 kg/m?.  ? ?Wt Readings from Last 3 Encounters:  ?04/26/22 200 lb 8 oz (90.9 kg)  ?03/08/22 197 lb 12.8 oz (89.7 kg)  ?03/05/22 200 lb 9.6 oz (91 kg)  ?  ? ?Ht Readings from Last 3 Encounters:  ?04/26/22 5\' 7"  (1.702 m)  ?03/05/22 5' 7.5" (1.715 m)  ?02/03/22 5\' 7"  (1.702 m)  ?  ?  ?General: The patient is awake, alert and appears not in acute distress. The patient is well groomed. ?Head: Normocephalic, atraumatic. Neck is supple. Mallampati 1,  ?neck circumference:13.5  inches . Nasal airflow patent.   ?Retrognathia is seen.  ?Dental status: crossbite,  overbite- irregular dentition.  ?Cardiovascular:  Regular rate and cardiac rhythm by pulse,  without distended neck veins. ?Respiratory: Lungs are clear to auscultation.  ?Skin:  With evidence of ankle edema- mild,Tr

## 2022-04-29 ENCOUNTER — Other Ambulatory Visit: Payer: Self-pay | Admitting: Neurology

## 2022-05-03 ENCOUNTER — Ambulatory Visit: Payer: BC Managed Care – PPO | Admitting: Adult Health

## 2022-05-04 ENCOUNTER — Encounter: Payer: Self-pay | Admitting: Neurology

## 2022-05-04 DIAGNOSIS — G43711 Chronic migraine without aura, intractable, with status migrainosus: Secondary | ICD-10-CM | POA: Diagnosis not present

## 2022-05-04 LAB — NARCOLEPSY EVALUATION
DQA1*01:02: POSITIVE
DQB1*06:02: POSITIVE

## 2022-05-05 NOTE — Telephone Encounter (Signed)
Received (1) 200 unit vial of Botox from [facility]. ?

## 2022-05-06 ENCOUNTER — Encounter: Payer: Self-pay | Admitting: Neurology

## 2022-05-11 ENCOUNTER — Ambulatory Visit (INDEPENDENT_AMBULATORY_CARE_PROVIDER_SITE_OTHER): Payer: BC Managed Care – PPO | Admitting: Adult Health

## 2022-05-11 DIAGNOSIS — G43711 Chronic migraine without aura, intractable, with status migrainosus: Secondary | ICD-10-CM | POA: Diagnosis not present

## 2022-05-11 NOTE — Progress Notes (Signed)
    05/11/2022: botox continues to work well. Trigger for migraines is typically weather related   BOTOX PROCEDURE NOTE FOR MIGRAINE HEADACHE    Contraindications and precautions discussed with patient(above). Aseptic procedure was observed and patient tolerated procedure. Procedure performed by Ward Givens, NP  The condition has existed for more than 6 months, and pt does not have a diagnosis of ALS, Myasthenia Gravis or Lambert-Eaton Syndrome.  Risks and benefits of injections discussed and pt agrees to proceed with the procedure.  Written consent obtained  These injections are medically necessary. ]These injections do not cause sedations or hallucinations which the oral therapies may cause.  Indication/Diagnosis: chronic migraine BOTOX(J0585) injection was performed according to protocol by [facility]. 200 units of BOTOX was dissolved into 4 cc NS.   NDC: [id number]  Botox- 200 units x 1 vial Lot: [id number] Expiration: 11/2024 NDC: [id number]   Bacteriostatic 0.9% Sodium Chloride- 25mL total Lot: [id number] Expiration: 07/14/2023 NDC: [id number]    Dx: [id number]  Description of procedure:  The patient was placed in a sitting position. The standard protocol was used for Botox as follows, with 5 units of Botox injected at each site:   -Procerus muscle, midline injection  -Corrugator muscle, bilateral injection  -Frontalis muscle, bilateral injection, with 2 sites each side, medial injection was performed in the upper one third of the frontalis muscle, in the region vertical from the medial inferior edge of the superior orbital rim. The lateral injection was again in the upper one third of the forehead vertically above the lateral limbus of the cornea, 1.5 cm lateral to the medial injection site.  -Temporalis muscle injection, 4 sites, bilaterally. The first injection was 3 cm above the tragus of the ear, second injection site was 1.5 cm to 3 cm up from the first injection  site in line with the tragus of the ear. The third injection site was 1.5-3 cm forward between the first 2 injection sites. The fourth injection site was 1.5 cm posterior to the second injection site.  -Occipitalis muscle injection, 3 sites, bilaterally. The first injection was done one half way between the occipital protuberance and the tip of the mastoid process behind the ear. The second injection site was done lateral and superior to the first, 1 fingerbreadth from the first injection. The third injection site was 1 fingerbreadth superiorly and medially from the first injection site.  -Cervical paraspinal muscle injection, 2 sites, bilateral knee first injection site was 1 cm from the midline of the cervical spine, 3 cm inferior to the lower border of the occipital protuberance. The second injection site was 1.5 cm superiorly and laterally to the first injection site.  -Trapezius muscle injection was performed at 3 sites, bilaterally. The first injection site was in the upper trapezius muscle halfway between the inflection point of the neck, and the acromion. The second injection site was one half way between the acromion and the first injection site. The third injection was done between the first injection site and the inflection point of the neck.   Will return for repeat injection in 3 months.   A 200 unit sof Botox was used, 155 units were injected, the rest of the Botox was wasted. The patient tolerated the procedure well, there were no complications of the above procedure.  Ward Givens, MSN, NP-C 05/11/2022, 7:55 AM [facility] [address], [place], [place] [address] [phone]

## 2022-05-11 NOTE — Progress Notes (Signed)
Botox- 200 units x 1 vial Lot: [id number] Expiration: 11/2024 NDC: [id number]  Bacteriostatic 0.9% Sodium Chloride- 2mL total Lot: [id number] Expiration: 07/14/2023 NDC: [id number]  Dx:G43.009 S/P

## 2022-05-18 DIAGNOSIS — F411 Generalized anxiety disorder: Secondary | ICD-10-CM | POA: Diagnosis not present

## 2022-05-23 ENCOUNTER — Other Ambulatory Visit: Payer: Self-pay | Admitting: Neurology

## 2022-05-23 ENCOUNTER — Other Ambulatory Visit: Payer: Self-pay | Admitting: Adult Health

## 2022-05-26 ENCOUNTER — Other Ambulatory Visit: Payer: Self-pay | Admitting: Neurology

## 2022-06-09 ENCOUNTER — Other Ambulatory Visit: Payer: Self-pay | Admitting: Neurology

## 2022-06-09 DIAGNOSIS — G43009 Migraine without aura, not intractable, without status migrainosus: Secondary | ICD-10-CM

## 2022-06-29 DIAGNOSIS — F411 Generalized anxiety disorder: Secondary | ICD-10-CM | POA: Diagnosis not present

## 2022-07-15 ENCOUNTER — Encounter (INDEPENDENT_AMBULATORY_CARE_PROVIDER_SITE_OTHER): Payer: Self-pay

## 2022-07-19 ENCOUNTER — Encounter (INDEPENDENT_AMBULATORY_CARE_PROVIDER_SITE_OTHER): Payer: Self-pay | Admitting: "Endocrinology

## 2022-07-27 ENCOUNTER — Telehealth: Payer: Self-pay

## 2022-07-27 DIAGNOSIS — F411 Generalized anxiety disorder: Secondary | ICD-10-CM | POA: Diagnosis not present

## 2022-07-27 NOTE — Telephone Encounter (Signed)
I called [facility] to check the status of the botox shipment. I spoke with Felicia. Patient has already consented to the botox shipment. They will begin processing this order for the shipment to arrive prior to her appt on 8/24. If the botox is not received by Tuesday 8/22 we can call back to [phone] to check the status.

## 2022-08-05 ENCOUNTER — Ambulatory Visit (INDEPENDENT_AMBULATORY_CARE_PROVIDER_SITE_OTHER): Payer: BC Managed Care – PPO | Admitting: Adult Health

## 2022-08-05 DIAGNOSIS — G43711 Chronic migraine without aura, intractable, with status migrainosus: Secondary | ICD-10-CM

## 2022-08-05 MED ORDER — ONABOTULINUMTOXINA 200 UNITS IJ SOLR
155.0000 [IU] | Freq: Once | INTRAMUSCULAR | Status: AC
  Administered 2022-08-05: 155 [IU] via INTRAMUSCULAR

## 2022-08-05 NOTE — Progress Notes (Signed)
Botox- 200 units x 1 vial Lot: [id number] Expiration: 01/2025 NDC: 0023-3921-02  Bacteriostatic 0.9% Sodium Chloride- 4mL total Lot: [id number] Expiration: 07/14/2023 NDC: 0409-1966-02  Dx: G43.711 S/P   

## 2022-08-05 NOTE — Progress Notes (Signed)
08/05/2022: Botox continues to work well for her.  She reports that if there is a bad storm she typically will get a migraine otherwise she has been doing well  05/11/2022: botox continues to work well. Trigger for migraines is typically weather related   BOTOX PROCEDURE NOTE FOR MIGRAINE HEADACHE    Contraindications and precautions discussed with patient(above). Aseptic procedure was observed and patient tolerated procedure. Procedure performed by Butch Penny, NP  The condition has existed for more than 6 months, and pt does not have a diagnosis of ALS, Myasthenia Gravis or Lambert-Eaton Syndrome.  Risks and benefits of injections discussed and pt agrees to proceed with the procedure.  Written consent obtained  These injections are medically necessary. ]These injections do not cause sedations or hallucinations which the oral therapies may cause.  Indication/Diagnosis: chronic migraine BOTOX(J0585) injection was performed according to protocol by [facility]. 200 units of BOTOX was dissolved into 4 cc NS.   NDC: [address]-09  Botox- 200 units x 1 vial Lot: [id number] Expiration: 01/2025 NDC: 2426-8341-96   Bacteriostatic 0.9% Sodium Chloride- 67mL total Lot: [id number] Expiration: 07/14/2023 NDC: 2229-7989-21   Dx: J94.174 S/P  Description of procedure:  The patient was placed in a sitting position. The standard protocol was used for Botox as follows, with 5 units of Botox injected at each site:   -Procerus muscle, midline injection  -Corrugator muscle, bilateral injection  -Frontalis muscle, bilateral injection, with 2 sites each side, medial injection was performed in the upper one third of the frontalis muscle, in the region vertical from the medial inferior edge of the superior orbital rim. The lateral injection was again in the upper one third of the forehead vertically above the lateral limbus of the cornea, 1.5 cm lateral to the medial injection site.  -Temporalis  muscle injection, 4 sites, bilaterally. The first injection was 3 cm above the tragus of the ear, second injection site was 1.5 cm to 3 cm up from the first injection site in line with the tragus of the ear. The third injection site was 1.5-3 cm forward between the first 2 injection sites. The fourth injection site was 1.5 cm posterior to the second injection site.  -Occipitalis muscle injection, 3 sites, bilaterally. The first injection was done one half way between the occipital protuberance and the tip of the mastoid process behind the ear. The second injection site was done lateral and superior to the first, 1 fingerbreadth from the first injection. The third injection site was 1 fingerbreadth superiorly and medially from the first injection site.  -Cervical paraspinal muscle injection, 2 sites, bilateral knee first injection site was 1 cm from the midline of the cervical spine, 3 cm inferior to the lower border of the occipital protuberance. The second injection site was 1.5 cm superiorly and laterally to the first injection site.  -Trapezius muscle injection was performed at 3 sites, bilaterally. The first injection site was in the upper trapezius muscle halfway between the inflection point of the neck, and the acromion. The second injection site was one half way between the acromion and the first injection site. The third injection was done between the first injection site and the inflection point of the neck.   Will return for repeat injection in 3 months.   A 200 unit sof Botox was used, 155 units were injected, the rest of the Botox was wasted. The patient tolerated the procedure well, there were no complications of the above procedure.  Butch Penny, MSN, NP-C  08/05/2022, 8:38 AM [facility] [address], [address] [place], [place] [address] [phone]

## 2022-08-05 NOTE — Telephone Encounter (Signed)
I called [facility]. I spoke with Omega. This was a 10 minute call. She reports that the PA on file for botox with [employer] is rejecting because the NPI is invalid on the PA. I advised her that we have been using this PA for botox since 02/18/2022 and the patient has had 2 botox appointments since this PA approval without this NPI issue. However, I provided Megan, NP's NPI number and [facility] will provide this number to [employer] to try and get the PA to run. Once this is completed and approved, they will be able to ship the botox for today's appointment. This may take up to 24 hours. Omega reports that they will call our office to give an update on the status of this. We can call Monday if we have not heard back.

## 2022-08-05 NOTE — Telephone Encounter (Signed)
Per [place] on 02/25/2022: "Received approval from [place], [place] # [id number] (02/18/2022-01/19/2023)

## 2022-08-05 NOTE — Telephone Encounter (Signed)
[facility] (Cherri) need ([facility]'s )NPI number updated on the PA. Can not get approved without the updated NPI. Would like a call back  Contact info: [phone]

## 2022-08-05 NOTE — Telephone Encounter (Signed)
I called [facility] and spoke with Tenelle. This was a 7 minute call. She reports they no longer handle patient's medications. I was asked to call [employer] at [phone].  I called [employer]. I spoke with Dutch Gray. This was a 19 minute call.  NPI  for Aundra Millet, NP  is correct. Botox claim is rejecting as service not covered even though the PA is approved. Upon further investigation, the PA completed in March of 2023 has a different NPI listed for [facility]. The PA for [facility] provided on the PA is invalid and incorrect. I asked that the PA but updated to reflect [facility]'s NPI of [id number]. This can take 24 hours to take effect. Then, [facility] needs to use that NPI to run the claim.   We should call [facility] on Monday to make sure the claim for botox is working and schedule delivery of her botox shipment.

## 2022-08-09 NOTE — Telephone Encounter (Signed)
I called [facility] again. I spoke with Dawn. The PA still has not been processed with the correct NPI. They are going to push this through as STAT again and I was advised to check back tomorrow morning. This was a 16 minute phone call.

## 2022-08-09 NOTE — Telephone Encounter (Signed)
I called [facility] and spoke with [facility]. The insurance verification is still in process. I asked her to expedite this since I was assured on 8/24 that this would not take any longer than 24 hours to complete. Arline Asp marked it as STAT processing and we can check the status later today.

## 2022-08-10 ENCOUNTER — Telehealth: Payer: Self-pay | Admitting: Neurology

## 2022-08-10 DIAGNOSIS — G43009 Migraine without aura, not intractable, without status migrainosus: Secondary | ICD-10-CM | POA: Diagnosis not present

## 2022-08-10 NOTE — Telephone Encounter (Signed)
I called [facility]. This was a 52 minute phone call. I spoke with [facility] in pharmacy. She was finally able to get the PA to go through with [employer]. I scheduled a delivery date of 08/11/22 for patient's botox.

## 2022-08-10 NOTE — Telephone Encounter (Signed)
NPSG/MSLT- [employer]BS Berkley Harvey: [id number] (exp. 08/10/22 to 10/08/22).  Patient is scheduled at [facility] for 09/29/22 at 8 pm.   Mailed packet and [employer] her.

## 2022-08-10 NOTE — Telephone Encounter (Signed)
I called [facility]. I spoke with Chrissy in the PA department. The botox PA is still in verification. Chrissy advised me that she will send an email to the supervisors and escalate this as far as possible and I should be getting a call regarding this within a few hours.

## 2022-08-17 NOTE — Telephone Encounter (Signed)
I spoke with Nancy [name]. Patient's botox arrived to replace the botox used for her most recent appointment.

## 2022-08-24 DIAGNOSIS — F411 Generalized anxiety disorder: Secondary | ICD-10-CM | POA: Diagnosis not present

## 2022-08-29 ENCOUNTER — Other Ambulatory Visit: Payer: Self-pay | Admitting: Nurse Practitioner

## 2022-08-29 DIAGNOSIS — Z79890 Hormone replacement therapy (postmenopausal): Secondary | ICD-10-CM

## 2022-08-30 DIAGNOSIS — E063 Autoimmune thyroiditis: Secondary | ICD-10-CM | POA: Diagnosis not present

## 2022-08-30 NOTE — Telephone Encounter (Signed)
Med refill request: Nuvaring 0.12-0.015 mg Last AEX: 10/12/21/TW Next AEX: 10/18/22/ TW Last MMG (if hormonal med) 11/24/21, BiRads 1 neg  Rx pended #1/1RF  Refill authorized: Please Advise?

## 2022-08-31 LAB — TSH: TSH: 1.21 mIU/L

## 2022-08-31 LAB — T3, FREE: T3, Free: 3 pg/mL (ref 2.3–4.2)

## 2022-08-31 LAB — T4, FREE: Free T4: 1.4 ng/dL (ref 0.8–1.8)

## 2022-09-06 ENCOUNTER — Encounter: Payer: Self-pay | Admitting: *Deleted

## 2022-09-07 ENCOUNTER — Other Ambulatory Visit: Payer: Self-pay

## 2022-09-07 ENCOUNTER — Telehealth: Payer: Self-pay | Admitting: Physician Assistant

## 2022-09-07 ENCOUNTER — Emergency Department (HOSPITAL_BASED_OUTPATIENT_CLINIC_OR_DEPARTMENT_OTHER)
Admission: EM | Admit: 2022-09-07 | Discharge: 2022-09-07 | Disposition: A | Discharge status: Home / Self Care | Payer: BC Managed Care – PPO | Attending: Emergency Medicine | Admitting: Emergency Medicine

## 2022-09-07 ENCOUNTER — Emergency Department (HOSPITAL_BASED_OUTPATIENT_CLINIC_OR_DEPARTMENT_OTHER): Payer: BC Managed Care – PPO | Admitting: Radiology

## 2022-09-07 ENCOUNTER — Encounter (HOSPITAL_BASED_OUTPATIENT_CLINIC_OR_DEPARTMENT_OTHER): Payer: Self-pay

## 2022-09-07 DIAGNOSIS — I491 Atrial premature depolarization: Secondary | ICD-10-CM | POA: Insufficient documentation

## 2022-09-07 DIAGNOSIS — E039 Hypothyroidism, unspecified: Secondary | ICD-10-CM | POA: Diagnosis not present

## 2022-09-07 DIAGNOSIS — R002 Palpitations: Secondary | ICD-10-CM

## 2022-09-07 DIAGNOSIS — R0602 Shortness of breath: Secondary | ICD-10-CM | POA: Diagnosis not present

## 2022-09-07 DIAGNOSIS — I493 Ventricular premature depolarization: Secondary | ICD-10-CM | POA: Diagnosis not present

## 2022-09-07 LAB — CBC
HCT: 39.7 % (ref 36.0–46.0)
Hemoglobin: 13.2 g/dL (ref 12.0–15.0)
MCH: 29.2 pg (ref 26.0–34.0)
MCHC: 33.2 g/dL (ref 30.0–36.0)
MCV: 87.8 fL (ref 80.0–100.0)
Platelets: 203 10*3/uL (ref 150–400)
RBC: 4.52 MIL/uL (ref 3.87–5.11)
RDW: 12.9 % (ref 11.5–15.5)
WBC: 4.9 10*3/uL (ref 4.0–10.5)
nRBC: 0 % (ref 0.0–0.2)

## 2022-09-07 LAB — BASIC METABOLIC PANEL
Anion gap: 10 (ref 5–15)
BUN: 12 mg/dL (ref 6–20)
CO2: 22 mmol/L (ref 22–32)
Calcium: 9.7 mg/dL (ref 8.9–10.3)
Chloride: 109 mmol/L (ref 98–111)
Creatinine, Ser: 0.8 mg/dL (ref 0.44–1.00)
GFR, Estimated: >60 mL/min (ref >60)
Glucose, Bld: 94 mg/dL (ref 70–99)
Potassium: 3.9 mmol/L (ref 3.5–5.1)
Sodium: 141 mmol/L (ref 135–145)

## 2022-09-07 LAB — TROPONIN I (HIGH SENSITIVITY): Troponin I (High Sensitivity): <2 ng/L (ref <18)

## 2022-09-07 LAB — HCG, SERUM, QUALITATIVE: Preg, Serum: NEGATIVE

## 2022-09-07 LAB — MAGNESIUM: Magnesium: 2.1 mg/dL (ref 1.7–2.4)

## 2022-09-07 NOTE — ED Triage Notes (Signed)
Patient here POV from Home.  Last PM and this AM the Patient was having Palpitations Sensation in Chest. Lasted 2 Hours Last PM and twice more this AM. Felt SOB at that time and Weak in Right Arm.  No Symptoms Currently.  NAD Noted during Triage. A&Ox4. GCS 15. Ambulatory.

## 2022-09-07 NOTE — Telephone Encounter (Signed)
Patient Name: Nancy [name] Gender: Female DOB: [birth year] Age: 41 Y 67 M 14 D Return Phone Number: [phone] (Primary), [phone] (Secondary) Address: City/ State/ Zip: [place] [address] Client [place] [facility] [place] Client Site [place] [facility] [place] [facility] Provider Morene Rankins, [place] Contact Type Call Who Is Calling Patient / Member / Family / Caregiver Call Type Triage / Clinical Relationship To Patient Self Return Phone Number [phone] (Primary) Chief Complaint BREATHING - shortness of breath or sounds breathless Reason for Call Symptomatic / Request for [name] states she is having irregular heart beat. States she is also having mild SOB and her right arm is a little weak. Translation No Nurse Assessment Nurse: Thad Ranger, RN, Denise Date/Time (Eastern Time): 09/07/2022 12:07:07 PM Confirm and document reason for call. If symptomatic, describe symptoms. ---Caller states she is having irregular heart beat. States she is also having mild SOB and her right arm is a little weak. Does the patient have any new or worsening symptoms? ---Yes Will a triage be completed? ---Yes Related visit to physician within the last 2 weeks? ---No Does the PT have any chronic conditions? (i.e. diabetes, asthma, this includes High risk factors for pregnancy, etc.) ---No Is the patient pregnant or possibly pregnant? (Ask all females between the ages of 35-55) ---No Is this a behavioral health or substance abuse call? ---No Guidelines Guideline Title Affirmed Question Affirmed Notes Nurse Date/Time (Eastern Time) Heart Rate and Heartbeat Questions Difficulty breathing Carmon, RN, Langley Gauss 09/07/2022 12:08:35 PM Disp. Time Eilene Ghazi Time) Disposition Final User 09/07/2022 12:10:21 PM Go to ED Now Yes Thad Ranger, RN, Langley Gauss  Final Disposition 09/07/2022 12:10:21 PM Go to ED Now Yes Carmon, RN,  Yevette Edwards Disagree/Comply Comply Caller Understands Yes PreDisposition Call Doctor Care Advice Given Per Guideline GO TO ED NOW: NOTE TO TRIAGER - DRIVING: * Another adult should drive. BRING MEDICINES: * Bring a list of your current medicines when you go to the Emergency Department (ER). CARE ADVICE given per Heart Rate and Heartbeat Questions (Adult) guideline. Referrals [place] - ED

## 2022-09-07 NOTE — Progress Notes (Unsigned)
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  HPI: [name] is a 41 y.o. Caucasian woman. She was accompanied by her husband.  1. [name] had her initial adult endocrine consultation with me on 02/03/11:  A. She was diagnosed with hypothyroidism due to Hashimoto's Thyroiditis in about September 2009, 6-7 months after the birth of her second child. She was started on levothyroxine.   B. When I saw her for that initial visit, she felt better on her Synthroid dose of 50 mcg/day on even-numbered days and 75 mcg/day on odd-numbered days, but was still cold. She had a 20+ gram gland. The remainder of her exam was normal  2. Clinical course: During the past ten years we have dealt with several issues:  A. We have gradually increased her dose of Synthroid/levothyroxine in order to attain a TSH goal of 1.0-2.0. Her symptoms of coldness and fatigue have improved, but not totally resolved.   B. Her weight has fluctuated during this time.   C. Her last regular menstrual period was on July 20th 2016. 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   D. In mid-February she had an anaphylactic reaction, possibly to keto bread, but she isn't sure. She now has an epipen. She had seen an allergist two weeks prior and her allergy tests were negative. Tests for alpha-gal allergy are pending.   E.  In Late May or early June 2022 she developed covid-19.  1). She really felt bad and was severely fatigued. She had to go to the ED several times due to having problems breathing, chest pain, and severe brain fog. She also lost some fine motor skills and developed severe hand tremors that she could not type or hold things.Her pre-covid baseline brain fog worsened dramatically and frighteningly. She had severe brain fog with trouble thinking and  remembering. At one point she was in her bathroom and could not figure out how to get out of the room. She lost a lot of memory for work tasks and other functional issues. She was not able to drive or to work for about 4-6 weeks.   2). After her visit in October 2022 she had several more bouts of brain fog, dizziness, and muscle weakness and pain at times. Her overall neurologic function declined. She could not write, hold a pen, remember how to do simple work tasks, she could not drive, she had to use two hands to hold a glass of water. She saw Dr. Jaynee Eagles in neurology. Her EMG was reportedly normal. She had many lab tests done, all of which were normal. She had some cognitive therapy to help her work around her disabilities. She will have tests for myasthenia gravis today. I 3). At her clinic visit in March 2023 she felt pretty good. Her brain fog was "not substantial". She still felt tired, but better than several weeks ago. Her muscle weakness was still present, but stable now. She was able to write, drive, and hold a glass of water. She had recovered to about the 70% of her pre-covid functioning at that time.   3. Her last clinic visit was on 03/08/22. At that visit I continued her Synthroid dosage of 1.5 of the 112 mcg tablets per day. I continued her Citracal/D, 600/400, three times daily and added one Tums at bedtime.  A. In the interim she has  not been feeling well since her last visit. Her brain fog was essentially bad for several months. She had been fairly healthy until this Spring.    1).. She worked part-time at 20 hours per week. She is also a full-time wife and mother.  [name] 2). She presented to the ED on 09/07/22 with palpitations. Her heart monitor showed some PACs. Her ECG showed NSR. Her troponin, BMP, and CBC were normal.    3). Nancy [name]'s migraines have been "really good" after she was treated with botox. She is now having migraines only 1-2 times per month.  She continues botox treatments  every 3 months.    4). She is still having leg cramps and muscle spasms, but better recently.         5). She has been sleeping better with trazodone. She still needs to nap for 1-2 hours per day    6). She stopped doing yoga when her migraines got bad. She stopped walking a mile every morning. Her back is not bothering her much. She still can't stand long enough to prepare a meal.    7). Her diet hasn't been as consistent. She is cooking less at home and eating out more.   B. She remains on 1.5 of the Synthroid 112 mcg tablets per day. She still takes Citracal-D two-three daily and trazodone at bedtime.   C. She saw a cardiologist on 03/05/22. She will wear a cardiac monitor for 14 days.    4. Pertinent Review of Systems: Constitutional. Nancy [name] feels "better than I have in the last 10 weeks".   Energy: Energy level is a bit better.    Body temperature: She is still very "cold natured".  Weight: She has re-gained 9 pounds.    Eyes: Her vision is good, but she has more issues with presbyopia. She had an eye exam in September 2022. There are no problems with soreness, bulging, or limited range of eye movements.   Neck: She is not aware of any problems relating to the anterior neck and thyroid bed. There have been no significant problems with swelling, pain, soreness, tenderness, pressure, discomfort, or difficulty swallowing. Heart: She sometimes has brief palpitations lasting "a couple of seconds". She feels the expected increase in heart rate during exercise or other physical activities. There have been no significant problems with palpitations, irregular heart beats, chest pain, or chest pressure. Gastrointestinal: She thinks that she has dairy intolerance. She does not have postprandial bloating. Bowel movements are normal. There are no significant complaints of excessive hunger, acid reflux, upset stomach, stomach aches or pains, diarrhea, or constipation. Hands: There is some tremor when she has  been using her hands for awhile. She is not having problems with sweaty palms or palmar erythema.  Legs: She has fewer leg cramps. No edema.  Psychological: She is tired of being sick for many months, but she is "okay" overall. She still sees her counselor once a month. Mental: Her brain fog today much less. She feels pretty clear today, but the brain fog tends to increase in the afternoons. She is doing better now, but need to take more concerted effort to keep herself organized.  GYN: She has premature ovarian failure.  PAST MEDICAL, FAMILY, AND SOCIAL HISTORY: 1. 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  The family still lives in [place], [place], where her husband, [name], is a Company secretary. Her sister was also diagnosed with hypothyroidism secondary to Hashimoto's thyroiditis.  She has a grand aunt who had Parkinson's Dz. [Addendum 02/19/11: Her paternal aunt was recently diagnosed with Hashimoto's disease.] 2. Activities: She has not been walking daily.    3. Tobacco, alcohol, and drugs: None 4. PCP: Ms. Inda Coke, PA 5. Neurology: Dr. Sarina Ill, [facility]  REVIEW OF SYSTEMS: Ms. kili gracy has no other significant issues involving her other body systems.  PHYSICAL EXAM: LMP 01/15/2015 (Exact Date) Comment: LMP 2016  Wt Readings from Last 3 Encounters:  09/07/22 200 lb 6.4 oz (90.9 kg)  04/26/22 200 lb 8 oz (90.9 kg)  03/08/22 197 lb 12.8 oz (89.7 kg)    Ht Readings from Last 3 Encounters:  09/07/22 5' 7" (1.702 m)  04/26/22 5' 7" (1.702 m)  03/05/22 5' 7.5" (1.715 m)   Constitutional: The patient looks healthy today, much better than I had expected. . She has re-gained 5 pounds. She is bright and alert. Her affect and insight are normal.   Head: I do not detect any head tremor today.    Eyes: There is no arcus or proptosis.  Mouth: The oropharynx appears normal. The tongue appears normal. There is normal oral moisture. There is  no obvious gingivitis. There is no oral  hyperpigmentation.  Neck: There are no bruits present. The thyroid gland appears mildly enlarged on inspection. The thyroid gland is slightly more enlarged today at about 21 grams. Today both lobes are mildly enlarged. The consistency of the thyroid gland is normal. There is no thyroid tenderness to palpation. Lungs: The lungs are clear. Air movement is good. Heart: The heart rhythm and rate appear normal. Heart sounds S1 and S2 are normal. I do not appreciate any pathologic heart murmurs. Abdomen: The abdomen is enlarged. Bowel sounds are normal. The abdomen is soft and non-tender. There is no obviously palpable hepatomegaly, splenomegaly, or other masses.  Arms: Muscle mass appears appropriate for age.  Hands: She has a trace tremor today. Phalangeal and metacarpophalangeal joints appear normal. Palms are cool, but not wet. She has no pallor of the fingernail beds when she flexes her fingers. She does not have any hyperpigmentation or erythema of the palms.   Legs: Muscle mass appears appropriate for age. There is no edema.  Neurologic: Muscle strength is normal for age and gender in both the upper and the lower extremities. Muscle tone appears normal. Sensation to touch is normal in the legs.  LAB DATA:   Labs 09/07/22: BMP normal; CBC normal; troponin high sensitivity <2 (ref <18); hCG negative; magnesium 2.1 (ref 1.7-2.4)  Labs 08/30/22: TSH 1.21, free T4 ,1.4 free T3 3.0  Labs 04/26/22: narcolepsy evaluation: DQA1*01:02 positive; DQB1*06:02 positive, c/w but not diagnostic of narcolepsy   Labs 03/08/22:Acetylcholine receptor binding antibody: 0.04 (ref 0.0-0.24); acetylcholine blocking antibody 19% (ref 0-25)  Labs 03/03/22: TSH 1.36, free T4 1.2, free T3 2.7  Labs 02/03/22: topiramate level 3.8 (ref 2.0-25.0); ANA comprehensive negative; HbA1c 5.4%; vitamin B1 144,7 (ref 66.5-200); methylmalonic acid 83 (ref 0-378); multiple myeloma protein normal;  Vitamin B6 5.1 (ref 3.4-65.2); heavy metals <1.0; Hep C antibody non-reactive; B12 928 (ref [id number]); folate 11.5 (ref >3.0); lactic acid 8.9 (ref 4.8-25.7); magnesium 2.2 (ref 1.6-2.3); lead <1.0); arsenic <3 , mercury <1.0;   Labs 02/01/22:  CMP normal, except alk phos 37 (ref 39-117); CBC normal ; CK 52 (ref 7-177); ANA negative; CRP ; ESR 13 (ref 0-20)  Labs 01/04/22:  TSH 0.09, free T4 1.6, free T3 2.7; PTH 18 (ref 16-77), calcium  9.7, 25-OH vitamin D 35  Labs 10/02/21 at 8:13 AM: TSH 2.68, free T4 1.3, free T3 2.8; ACTH 25, cortisol 27.2 (ref 4-22); PTH 12 (ref 16-77), calcium 9.6 (ref 8.6-10.2), 25-OH vitamin D 39, calcitriol 57 (ref 18-72), phosphorus 2.6 (ref 2.5-4.5)  Labs 09/07/21: CMP normal, except alk phos 35 (ref 39-117); CBC normal; cholesterol 178, triglycerides 130, HDL 65, LDL 87  Labs 05/29/21 8:10 AM: TSH 1.93, free T4 1.3, free T3 2.5; ACTH 27 (ref 6-50), cortisol 29.2 (ref 4-22)   Labs 03/13/21: TSH 1.50, free T4 1.4, free T3 2.4; 25-OH vitamin D 33; PTH 16 (ref 17-77), calcium 9.5 (ref 8.6-10.2); alpha-gal panel negative  Labs 01/12/21: TSH 0.81, free T4 1.4, free T3 2.6  Labs 09/10/20: TSH 3.10, free T4 1.4, free T3 2.4; PTH 16 (14-64), calcium 9.5 (8.6-10.2), 25-OH vitamin D 40  Labs 7/12 21: TSH 2.16, free T4 1.3, free T3 2.6  Labs 09/01/20: CMP normal; CBC normal; cholesterol 163, triglycerides 238, HDL 58, LDL 72; Hepatitis C antibody non-reactive; HIV non-reactive  Labs 02/18/20: TSH 2.19, free T4 1.4, free T3 2.5; PTH 21, calcium 9.4, 25-OH vitamin D 31  Labs 12/10/19: TSH 2.17, free T4 1.2, free T3 2.8  Labs 08/14/19: TSH 3.77, free 4 1.4, free T3 2.4; CMP normal, with calcium 9.3; PTH 20 (ref 14-64); 25-OH vitamin D 29  Labs 05/21/19: TSH 0.17, free T4 1.4, free T3 2.9  Labs 02/06/19: TSH 0.14, free T4 1.7, free T3 3.2; PTH 15, calcium 9.1, 25-OH vitamin D 34  Labs 11/29/18: TSH 4.82, free T4 1.1, free T3 2.3  Labs 09/26/18: TSH 5.37, free T4 1.0, free T3  2.2  Labs 06/09/18: TSH 2.97, free T4 1.4, free T3 2.6; PTH 11, calcium 9.7, 25-OH vitamin 38  Labs 04/07/18: TSH 1.95, free T4 1.4, free T3 2.6  Labs 12/08/17: TSH 1.53, free T4 1.4, free T3 2.5  Labs 06/02/17: TSH 1.49, free T4 1.5, free T3 2.6; 1,25-OH vitamin D 70 (ref 18-72), 25-OH vitamin D 29, magnesium 2.1 (ref 1.5-2.5), phosphorus 3.0 (ref 2.5-4.5), PTH 25 (ref 14-64), calcium 9.3 (8.6-10.2); CMP normal   Labs 02/16/17: TSH 2.62, free T4 1.4, free T3 2.9  11/16/16: TSH 2.85, free T4 1.2, free T3 2.6  08/17/16: TSH 2.234, free T4 1.4, free T3 2.4  04/14/16: TSH 2.90, free T4 1.4, free T3 2.6  10/08/15: TSH 1.098, free T4 1.10, free T3 3.0; CBC normal, iron 97  08/20/15: TSH 1.513, free T4 1.34, free T3 2.8  04/09/15: TSH 2.563, free T4 1.29, free T3 2.6; WBC 4.3, Hgb 13.3, Hct 39.9%; iron 95  10/07/14: TSH 2.478, free T4 1.39, free T3 2.8  04/03/14: TSH 4.281, free T4 1.27, free T3 3.0  10/04/13: TSH 2.393, free T4 1.34, free T3 2.9  03/16/13: TSH 2.862, free T4 1.14, free T3 2.7  01/22/13: TSH 4.261, free T4 1.14, free T3 2.9  06/21/12: TSH 2.427, free T4 1.11, free T3 3.1  03/20/12: TSH 2.980, free T4 1.14, free T3 3.1  IMAGING  02/16/22: MRI of brain and cervical spine with and  without contrast: Normal  ASSESSMENT: 1. Hypothyroidism, acquired, autoimmune:   A. Oliva is clinically euthyroid today, but with some hypothyroid symptoms. Her TFTs were mid-euthyroid in March 2023 on her current dosage of levothyroxine. B. It appears that Satara is one of those people who has a very narrow therapeutic window for Synthroid, so we really needs to keep her TSH between 1.0-2.0, which is the ideal goal  range for TSH for patients who are on Synthroid/levothyroxine replacement.   C. We will continue to check her TFTs every 3 months and see her every 6 months in follow up.  2. Thyroiditis: Her thyroiditis is clinically quiescent today, but intermittently active.  3. Goiter: Her  goiter has increased in size and is more enlarged. The process of waxing and waning of thyroid gland size over time is c/w evolving Hashimoto's disease.  4. Pallor: Not present 5-6. Fatigue/poor sleep:   A. Her fatigue improved when her thyroid hormone levels were mid-euthyroid in the past and when she was sleeping better. I suspect that her early awakening has been a major factor in her fatigue.   B. After switching to trazodone she has been  sleeping much better.   C. Unfortunately, she is still fatigued and has more brain fog, status post covid-19. She may have some other neurologic disease  D. Given her autoimmune hypothyroidism and her presumably autoimmune premature ovarian failure, we need to anticipate possible Addison's disease. Fortunately, her ACTH and cortisol values have remained normal in June and October 2022.  7. Head tremor: None today 8. Tremor: She has trace hand tremor today.    9. 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 With the addition of botox, Topiramate XR, and other medications, the frequency and severity of her migraines have markedly improved.    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 This problem has become increasingly more common in the population, paralleling the increasing incidence of autoimmune thyroid disease and other autoimmune diseases in the population at large.  11. Muscle weakness, cramps, and spasms:   A. Her muscle weakness has been a major issue for her, but her cramps and spasms have been occurring less frequently.    B. Given the autoimmune nature of her hypothyroidism and POF, it was quite possible that she might be developing autoimmune hypoparathyroidism. Fortunately, in June 2018 her PTH, calcium, magnesium, phosphorus, and calcitriol levels were normal. Her vitamin D level was a bit low. Since then the vitamin D level has improved.  C. Her calcium and PTH were still good as of June  2019 and February 2020.   D. In her most labs in September 2021, the calcium had increased and the PTH had decreased, but the PTH was low normal and the calcium was only mid-normal. Her lab tests from 03/13/21 were normal.   E. Her labs in October 2022 showed that her PTH was low, but her calcium had increased, her 5-OH vitamin D had increased, her calcitriol was good, and her phosphorus was good, altogether indicating that her PTH function was still good.   E. In January 2023 her calcium and vitamin D levels were normal.  12. Vitamin D deficiency:   A. Because she has been reducing her dairy intake, and because her intestine absorbs vitamin D less well over time, she needs exogenous vitamin D.  B.  After converting to one Citracal-D at lunch, at dinner, and at bedtime, her 25-OH vitamin D has improved. Her vitamin D levels in October 2022 and again in January 2-023 were normal. 12. Covid long-hauler, chronic neurologic symptoms: She had severe neurologic/mental status problems due to covid. In many ways her problems were much worse since her last visit, but seem to be beginning to improve. Fortunately, she is recovering, but is not yet back to her normal baseline.     Plan:  1. Diagnostic: Check  TFTs in 3 months. 2. Therapeutic: Continue Synthroid dose of 1.5 of the 112 mcg pills per day. Continue one Citracal, 600/400 tablet three times daily. Add one Tums at bedtime. 3. Patient education: We discussed the fact that she has gradually lost thyroid cells over time. Therefore we will need to check her TFTs every 3-6 months and increase the dose of Synthroid as needed. We also discussed the need to draw labs to assess her PTH, calcium, and vitamin D status to identify if she develops autoimmune hypoparathyroidism. 4. Follow-up: FU in three months.   Level of Service: This visit lasted in excess of 55 minutes. More than 50% of the visit was devoted to counseling.  Tillman Sers, MD, CDE Adult and  Pediatric Endocrinology

## 2022-09-07 NOTE — Telephone Encounter (Signed)
Pt in the ED

## 2022-09-07 NOTE — ED Notes (Signed)
Patient call RN into room.  States felt her "palpations" again.  ECG strip reviewed and noted occasional PAC.

## 2022-09-07 NOTE — ED Provider Notes (Signed)
[facility] EMERGENCY DEPT Provider Note   CSN: [id number] Arrival date & time: 09/07/22  1250     History  Chief Complaint  Patient presents with   Palpitations    Nancy [name] is a 41 y.o. female.  Nancy [name] is a 41 y.o. female with a history of hypothyroidism, migraines, eczema, fatigue, who presents to the emergency department for evaluation of palpitations.  Patient reports for the past year she has had intermittent palpitations but they typically only lasted a few seconds.,  But last night she had an episode of palpitations that she describes as feeling like her heart is beating irregularly and some beats are more intense that lasted 2 hours.  She reports that the palpitations eventually resolved but this morning she had 2 additional episodes lasting about an hour to an hour and a half.  She reports sometimes with these palpitations she feels short of breath which she describes as feeling like she needs to focus on taking a deep breath.  She denies any associated chest pain, she has felt dizzy briefly with palpitations but denies any syncopal episode.  She also reports that she intermittently feels a brief weakness in her right arm that is not persistent.  No associated numbness, no weakness elsewhere, no facial droop, changes in speech or visual changes, no headache.  She has been evaluated for similar palpitations and wore a 14-day heart monitor that showed some occasional PVCs, has not followed up with cardiology more recently as palpitations have once again become more persistent.  The history is provided by the patient, the spouse and medical records.  Palpitations Associated symptoms: dizziness and shortness of breath   Associated symptoms: no chest pain, no cough, no nausea and no vomiting        Home Medications Prior to Admission medications   Medication Sig Start Date End Date Taking? Authorizing Provider  Botulinum Toxin Type  A (BOTOX) 200 units SOLR PROVIDER TO INJECT 155 UNITS INTO HEAD AND NECK MUSCLES EVERY 12 WEEKS.  DISCARD REMAINDER. 04/08/22   Ward Givens, NP  Calcium-Magnesium-Vitamin D (CITRACAL CALCIUM+D) 600-40-500 MG-MG-UNIT TB24 Take 1 tablet at dinner and 1 tablet at bedtime. 12/04/18   Sherrlyn Hock, MD  cetirizine (ZYRTEC) 10 MG tablet Take 10 mg by mouth daily as needed.    [provider]  EPINEPHrine 0.3 mg/0.3 mL IJ SOAJ injection Inject 0.3 mg into the muscle as needed for anaphylaxis. 01/30/21   Inda Coke, PA  levothyroxine (SYNTHROID) 112 MCG tablet TAKE 1 1/2 TABLETS BY MOUTH DAILY 04/21/22   Sherrlyn Hock, MD  naproxen (NAPROSYN) 500 MG tablet Take 500 mg by mouth as needed.    [provider]  NUVARING 0.12-0.015 MG/24HR vaginal ring INSERT 1 RING VAGINALLY AND LEAVE IN PLACE FOR 28 DAYS, THEN REPLACE 08/30/22   Marny Lowenstein A, NP  ondansetron (ZOFRAN-ODT) 4 MG disintegrating tablet DISSOLVE 1 TABLET(4 MG) ON THE TONGUE EVERY 8 HOURS AS NEEDED FOR NAUSEA OR VOMITING 06/10/22   Melvenia Beam, MD  SUMAtriptan (IMITREX) 100 MG tablet TAKE 1 TABLET(100 MG) BY MOUTH 1 TIME. MAY REPEAT IN 2 HOURS IF HEADACHE PERSISTS OR RECURS 12/01/21   Ward Givens, NP  Topiramate ER (TROKENDI XR) 50 MG CP24 TAKE 1 CAPSULE BY MOUTH EVERY NIGHT AT BEDTIME, TAKE WITH 100MG  CAPSULE FOR A TOTAL DOSE OF 150MG  05/24/22   Ward Givens, NP  traZODone (DESYREL) 50 MG tablet TAKE 1 TABLET(50 MG) BY MOUTH  AT BEDTIME AS NEEDED FOR SLEEP 05/26/22   Dohmeier, Asencion Partridge, MD  TROKENDI XR 100 MG CP24 TAKE 1 CAPSULE BY MOUTH EVERY NIGHT AT BEDTIME WITH 50MG  FOR TOTAL DOSE OF 150MG  05/24/22   Ward Givens, NP      Allergies    Codeine    Review of Systems   Review of Systems  Constitutional:  Negative for chills and fever.  Eyes:  Negative for visual disturbance.  Respiratory:  Positive for shortness of breath. Negative for cough.   Cardiovascular:  Positive for palpitations. Negative  for chest pain and leg swelling.  Gastrointestinal:  Negative for abdominal pain, nausea and vomiting.  Neurological:  Positive for dizziness. Negative for syncope, light-headedness and headaches.    Physical Exam Updated Vital Signs BP 138/87 (BP Location: Right Arm)   Pulse 100   Temp 97.8 F (36.6 C)   Resp 16   Ht 5\' 7"  (1.702 m)   Wt 90.9 kg   LMP 01/15/2015 (Exact Date) Comment: LMP 2016  SpO2 100%   BMI 31.39 kg/m  Physical Exam Vitals and nursing note reviewed.  Constitutional:      General: She is not in acute distress.    Appearance: Normal appearance. She is well-developed. She is not ill-appearing or diaphoretic.  HENT:     Head: Normocephalic and atraumatic.  Eyes:     General:        Right eye: No discharge.        Left eye: No discharge.     Pupils: Pupils are equal, round, and reactive to light.  Cardiovascular:     Rate and Rhythm: Normal rate and regular rhythm.     Pulses: Normal pulses.     Heart sounds: Normal heart sounds.  Pulmonary:     Effort: Pulmonary effort is normal. No respiratory distress.     Breath sounds: Normal breath sounds. No wheezing or rales.     Comments: Respirations equal and unlabored, patient able to speak in full sentences, lungs clear to auscultation bilaterally  Abdominal:     General: Bowel sounds are normal. There is no distension.     Palpations: Abdomen is soft.     Tenderness: There is no abdominal tenderness.     Comments: Abdomen soft, nondistended, nontender to palpation in all quadrants without guarding or peritoneal signs  Musculoskeletal:        General: No deformity.     Cervical back: Neck supple.  Skin:    General: Skin is warm and dry.     Capillary Refill: Capillary refill takes less than 2 seconds.  Neurological:     Mental Status: She is alert and oriented to person, place, and time.     Coordination: Coordination normal.     Comments: Speech is clear, able to follow commands Moves extremities  without ataxia, coordination intact  Psychiatric:        Mood and Affect: Mood normal.        Behavior: Behavior normal.     ED Results / Procedures / Treatments   Labs (all labs ordered are listed, but only abnormal results are displayed) Labs Reviewed  BASIC METABOLIC PANEL  CBC  HCG, SERUM, QUALITATIVE  MAGNESIUM  TROPONIN I (HIGH SENSITIVITY)    EKG EKG Interpretation  Date/Time:  Tuesday September 07 2022 13:26:05 EDT Ventricular Rate:  93 PR Interval:  124 QRS Duration: 82 QT Interval:  362 QTC Calculation: 450 R Axis:   89 Text Interpretation: Normal sinus rhythm Right  atrial enlargement Borderline ECG When compared with ECG of 26-May-2021 15:52, No significant change was found Confirmed by Pattricia Boss [phone]) on 09/07/2022 1:30:39 PM  Radiology DG Chest 2 View  Result Date: 09/07/2022 CLINICAL DATA:  Shortness of breath, palpitations EXAM: CHEST - 2 VIEW COMPARISON:  05/26/2021 FINDINGS: The heart size and mediastinal contours are within normal limits. Both lungs are clear. The visualized skeletal structures are unremarkable. IMPRESSION: No active cardiopulmonary disease. Electronically Signed   By: Elmer Picker M.D.   On: 09/07/2022 14:06    Procedures Procedures    Medications Ordered in ED Medications - No data to display  ED Course/ Medical Decision Making/ A&P                           Medical Decision Making Amount and/or Complexity of Data Reviewed Labs: ordered. Radiology: ordered.   41 y.o. female presents to the ED with complaints of palpitations, this involves an extensive number of treatment options, and is a complaint that carries with it a high risk of complications and morbidity.  The differential diagnosis includes arrhythmia, PACs, PVCs, electrolyte derangement, anxiety   On arrival pt is nontoxic, vitals WNL.  Not currently experiencing the palpitations  Additional history obtained from spouse at bedside. Previous records  obtained and reviewed including prior cardiology follow-up  EKG: Normal sinus rhythm, no ischemic changes  Lab Tests:  I Ordered, reviewed, and interpreted labs, which included: No leukocytosis, normal hemoglobin, no electrolyte derangements and normal renal function, troponin negative, normal magnesium and negative pregnancy, TSH level checked last week and were normal.  Imaging Studies ordered:  I ordered imaging studies which included chest x-ray, I independently visualized and interpreted imaging which showed no active cardiopulmonary disease  ED Course:   Patient had brief episode of palpitations here in the ED and upon review of cardiac monitoring she had a few PACs at this time but no other arrhythmia noted.  Provided patient with reassurance and discussed lab evaluation, chest x-ray EKG and cardiac monitoring done in the ED here today with patient and spouse at bedside.  Suspect patient may be having increased burden of PACs or PVCs leading to palpitations.  I think patient needs close follow-up with her cardiologist but do not feel there is further emergent work-up needed at this time.  Discussed return precautions.  At this time there does not appear to be any evidence of an acute emergency medical condition requiring further emergent evaluation and the patient appears stable for discharge with appropriate outpatient follow up. Diagnosis and return precautions discussed with patient who verbalizes understanding and is agreeable to discharge.     Portions of this note were generated with Lobbyist. Dictation errors may occur despite best attempts at proofreading.         Final Clinical Impression(s) / ED Diagnoses Final diagnoses:  Palpitations  Premature atrial contractions    Rx / DC Orders ED Discharge Orders          Ordered    Ambulatory referral to Cardiology       Comments: If you have not heard from the Cardiology office within the next 72 hours  please call [phone].   09/07/22 Phillips, [place], PA-C [id number]    Gray, [place], DO [phone]

## 2022-09-07 NOTE — Discharge Instructions (Addendum)
Your lab work, EKG and chest x-ray today were all very reassuring.  On cardiac monitoring here it appears that you are having some intermittent premature atrial contractions.  This could be what is causing her symptoms especially if you are intermittently having a higher burden of these.  Please follow-up with your cardiologist for further evaluation of palpitations.  If you develop chest pain, shortness of breath, more persistent palpitations, passout or have other new or worsening symptoms return for reevaluation.

## 2022-09-07 NOTE — Telephone Encounter (Signed)
Patient states that she is not feeling right-and that she has quick PCV's (extra beat in the lower chamber of heart) every day but they usually only last a few seconds-over the last 2 months they are lasting longer.  On 09/06/22 Patient states PCV lasted 2 hours and on 09/07/22 in the am it lasted 1 hour 20 minutes-at 11:20 am it started again and has not stopped.  Patient states she has mild shortness of breath and right arm is a little weak.  Transferred to Triage.

## 2022-09-07 NOTE — ED Notes (Signed)
Patient placed in room and put on cardiac monitor.  Warm blankets given.  No further needs voiced at this time.

## 2022-09-08 ENCOUNTER — Ambulatory Visit (INDEPENDENT_AMBULATORY_CARE_PROVIDER_SITE_OTHER): Payer: BC Managed Care – PPO | Admitting: "Endocrinology

## 2022-09-08 ENCOUNTER — Encounter (INDEPENDENT_AMBULATORY_CARE_PROVIDER_SITE_OTHER): Payer: Self-pay | Admitting: "Endocrinology

## 2022-09-08 VITALS — BP 122/78 | HR 84 | Wt 197.0 lb

## 2022-09-08 DIAGNOSIS — E063 Autoimmune thyroiditis: Secondary | ICD-10-CM | POA: Diagnosis not present

## 2022-09-08 DIAGNOSIS — E2839 Other primary ovarian failure: Secondary | ICD-10-CM | POA: Diagnosis not present

## 2022-09-08 DIAGNOSIS — R5383 Other fatigue: Secondary | ICD-10-CM

## 2022-09-08 DIAGNOSIS — E049 Nontoxic goiter, unspecified: Secondary | ICD-10-CM | POA: Diagnosis not present

## 2022-09-08 DIAGNOSIS — R2990 Unspecified symptoms and signs involving the nervous system: Secondary | ICD-10-CM

## 2022-09-08 DIAGNOSIS — U099 Post covid-19 condition, unspecified: Secondary | ICD-10-CM

## 2022-09-08 NOTE — Patient Instructions (Signed)
No further follow up here.  At [facility], we are committed to providing exceptional care. You will receive a patient satisfaction survey through text or email regarding your visit today. Your opinion is important to me. Comments are appreciated.  

## 2022-09-09 DIAGNOSIS — R002 Palpitations: Secondary | ICD-10-CM | POA: Insufficient documentation

## 2022-09-09 NOTE — Progress Notes (Signed)
Cardiology Office Note   Date:  09/10/2022   ID:  maleigha fowler, DOB 07/15/1981, MRN [id number]  PCP:  Inda Coke, PA  Cardiologist:   None Referring:    Chief Complaint  Patient presents with   Palpitations      History of Present Illness: Nancy [name] is a 41 y.o. female who presents for evaluation of weakness.  She has chronic fatigue and upper and lower extremity weakness and has been seen by neurology.  She has extensive blood work ordered.  He had a normal brain MRI.  She is being treated for possible migraines with Botox.  She had a CT of her chest to rule out pulmonary embolism.  There was no pulmonary embolism or other disease identified.  She had no suggestion of coronary calcium or aortic atherosclerosis.  Shehad episodes of weakness .  There was no clear etiology.  She wore a monitor and she had some brief runs of SVT with the longest lasting 8 beats.    She was in the emergency room with episodes of fluttering.  She was feeling it increasing in frequency and intensity in July.  It would last for 1-1/2 hours.  I do see some recordings on her phone but there are no sustained tach arrhythmias.  There was no atrial fibrillation.  There was no clear SVT.  She may or may not have captured the events.  She feels lightheaded and presyncopal with them but has not been able to check her blood pressure.  She is not having any new chest discomfort or shortness of breath.  In the emergency room they were not able to record her events.  EKG was unremarkable.  I did review these records for this visit.   Past Medical History:  Diagnosis Date   Allergy    COVID    Eczema    Fatigue    Hashimoto's disease    Hypothyroidism, acquired, autoimmune    Migraines    Thyroiditis, autoimmune    Hashimotos    Vaginal delivery 2006, 2009    Past Surgical History:  Procedure Laterality Date   MOLE REMOVAL  12/13/2008   tongue growth  12/13/2008   WISDOM  TOOTH EXTRACTION  12/13/1997     Current Outpatient Medications  Medication Sig Dispense Refill   Botulinum Toxin Type A (BOTOX) 200 units SOLR PROVIDER TO INJECT 155 UNITS INTO HEAD AND NECK MUSCLES EVERY 12 WEEKS.  DISCARD REMAINDER. 1 each 3   Calcium-Magnesium-Vitamin D (CITRACAL CALCIUM+D) 600-40-500 MG-MG-UNIT TB24 Take 1 tablet at dinner and 1 tablet at bedtime. 90 tablet 1   cetirizine (ZYRTEC) 10 MG tablet Take 10 mg by mouth daily as needed.     EPINEPHrine 0.3 mg/0.3 mL IJ SOAJ injection Inject 0.3 mg into the muscle as needed for anaphylaxis. 2 each 1   levothyroxine (SYNTHROID) 112 MCG tablet TAKE 1 1/2 TABLETS BY MOUTH DAILY 180 tablet 3   naproxen (NAPROSYN) 500 MG tablet Take 500 mg by mouth as needed.     NUVARING 0.12-0.015 MG/24HR vaginal ring INSERT 1 RING VAGINALLY AND LEAVE IN PLACE FOR 28 DAYS, THEN REPLACE 3 each 0   ondansetron (ZOFRAN-ODT) 4 MG disintegrating tablet DISSOLVE 1 TABLET(4 MG) ON THE TONGUE EVERY 8 HOURS AS NEEDED FOR NAUSEA OR VOMITING 30 tablet 0   propranolol (INDERAL) 10 MG tablet Take 1 tablet (10 mg total) by mouth every 8 (eight) hours as needed. 90 tablet 1  SUMAtriptan (IMITREX) 100 MG tablet TAKE 1 TABLET(100 MG) BY MOUTH 1 TIME. MAY REPEAT IN 2 HOURS IF HEADACHE PERSISTS OR RECURS 10 tablet 5   Topiramate ER (TROKENDI XR) 50 MG CP24 TAKE 1 CAPSULE BY MOUTH EVERY NIGHT AT BEDTIME, TAKE WITH 100MG  CAPSULE FOR A TOTAL DOSE OF 150MG  30 capsule 11   traZODone (DESYREL) 50 MG tablet TAKE 1 TABLET(50 MG) BY MOUTH AT BEDTIME AS NEEDED FOR SLEEP 30 tablet 5   TROKENDI XR 100 MG CP24 TAKE 1 CAPSULE BY MOUTH EVERY NIGHT AT BEDTIME WITH 50MG  FOR TOTAL DOSE OF 150MG  30 capsule 11   No current facility-administered medications for this visit.    Allergies:   Codeine    ROS:  Please see the history of present illness.   Otherwise, review of systems are positive for none.   All other systems are reviewed and negative.    PHYSICAL EXAM: VS:  BP 122/68    Pulse 60   Ht 5' 7.5" (1.715 m)   Wt 197 lb 6.4 oz (89.5 kg)   LMP 01/15/2015 (Exact Date) Comment: LMP 2016  SpO2 100%   BMI 30.46 kg/m  , BMI Body mass index is 30.46 kg/m. GENERAL:  Well appearing NECK:  No jugular venous distention, waveform within normal limits, carotid upstroke brisk and symmetric, no bruits, no thyromegaly LUNGS:  Clear to auscultation bilaterally CHEST:  Unremarkable HEART:  PMI not displaced or sustained,S1 and S2 within normal limits, no S3, no S4, no clicks, no rubs, no murmurs ABD:  Flat, positive bowel sounds normal in frequency in pitch, no bruits, no rebound, no guarding, no midline pulsatile mass, no hepatomegaly, no splenomegaly EXT:  2 plus pulses throughout, no edema, no cyanosis no clubbing    EKG:  EKG is not ordered today. The ekg ordered today demonstrates sinus rhythm, rate 93, axis within normal limits, intervals within normal limits, no acute ST-T wave changes.  09/07/2022   Recent Labs: 02/01/2022: ALT 16 08/30/2022: TSH 1.21 09/07/2022: BUN 12; Creatinine, Ser 0.80; Hemoglobin 13.2; Magnesium 2.1; Platelets 203; Potassium 3.9; Sodium 141    Lipid Panel    Component Value Date/Time   CHOL 178 09/07/2021 1114   TRIG 130.0 09/07/2021 1114   HDL 65.20 09/07/2021 1114   CHOLHDL 3 09/07/2021 1114   VLDL 26.0 09/07/2021 1114   LDLCALC 87 09/07/2021 1114   LDLCALC 72 09/01/2020 1027      Wt Readings from Last 3 Encounters:  09/10/22 197 lb 6.4 oz (89.5 kg)  09/08/22 197 lb (89.4 kg)  09/07/22 200 lb 6.4 oz (90.9 kg)      Other studies Reviewed: Additional studies/ records that were reviewed today include: ED records Review of the above records demonstrates:  Please see elsewhere in the note.     ASSESSMENT AND PLAN:  Palpitations: I am going to check an echocardiogram given the increased frequency and intensity.  I am going to give her propranolol 10 mg as needed every 8 hours.  We also talked about over-the-counter  supplemental magnesium which can help even in the face of normal magnesium levels.  Further management will be based on the results and response to therapy.   Current medicines are reviewed at length with the patient today.  The patient does not have concerns regarding medicines.  The following changes have been made: As above  Labs/ tests ordered today include:    Orders Placed This Encounter  Procedures   ECHOCARDIOGRAM COMPLETE     Disposition:  FU with me in 3 months   Signed, Minus Breeding, MD  09/10/2022 1:01 PM    [facility]

## 2022-09-10 ENCOUNTER — Other Ambulatory Visit: Payer: Self-pay | Admitting: Cardiology

## 2022-09-10 ENCOUNTER — Ambulatory Visit: Payer: BC Managed Care – PPO | Attending: Cardiology | Admitting: Cardiology

## 2022-09-10 ENCOUNTER — Encounter: Payer: Self-pay | Admitting: Cardiology

## 2022-09-10 VITALS — BP 122/68 | HR 60 | Ht 67.5 in | Wt 197.4 lb

## 2022-09-10 DIAGNOSIS — R531 Weakness: Secondary | ICD-10-CM

## 2022-09-10 DIAGNOSIS — R002 Palpitations: Secondary | ICD-10-CM

## 2022-09-10 MED ORDER — PROPRANOLOL HCL 10 MG PO TABS: 10.0000 mg | ORAL_TABLET | Freq: Three times a day (TID) | ORAL | 1 refills | Status: DC | PRN

## 2022-09-10 NOTE — Patient Instructions (Signed)
Medication Instructions:  START Propranolol 10 mg every 8 hours as needed  *If you need a refill on your cardiac medications before your next appointment, please call your pharmacy*   Testing/Procedures: Echocardiogram - Your physician has requested that you have an echocardiogram. Echocardiography is a painless test that uses sound waves to create images of your heart. It provides your doctor with information about the size and shape of your heart and how well your heart's chambers and valves are working. This procedure takes approximately one hour. There are no restrictions for this procedure.     Follow-Up: At [facility], you and your health needs are our priority.  As part of our continuing mission to provide you with exceptional heart care, we have created designated Provider Care Teams.  These Care Teams include your primary Cardiologist (physician) and Advanced Practice Providers (APPs -  Physician Assistants and Nurse Practitioners) who all work together to provide you with the care you need, when you need it.  We recommend signing up for the patient portal called "MyChart".  Sign up information is provided on this After Visit Summary.  MyChart is used to connect with patients for Virtual Visits (Telemedicine).  Patients are able to view lab/test results, encounter notes, upcoming appointments, etc.  Non-urgent messages can be sent to your provider as well.   To learn more about what you can do with MyChart, go to [email or web address].    Your next appointment:   2 month(s)  The format for your next appointment:   In Person  Provider:   Minus Breeding, MD

## 2022-09-13 ENCOUNTER — Encounter: Payer: Self-pay | Admitting: Physician Assistant

## 2022-09-13 ENCOUNTER — Ambulatory Visit (INDEPENDENT_AMBULATORY_CARE_PROVIDER_SITE_OTHER): Payer: BC Managed Care – PPO | Admitting: Physician Assistant

## 2022-09-13 VITALS — BP 100/62 | HR 72 | Temp 97.7°F | Ht 67.5 in | Wt 197.0 lb

## 2022-09-13 DIAGNOSIS — Z23 Encounter for immunization: Secondary | ICD-10-CM | POA: Diagnosis not present

## 2022-09-13 DIAGNOSIS — E063 Autoimmune thyroiditis: Secondary | ICD-10-CM | POA: Diagnosis not present

## 2022-09-13 DIAGNOSIS — E669 Obesity, unspecified: Secondary | ICD-10-CM | POA: Diagnosis not present

## 2022-09-13 DIAGNOSIS — Z0000 Encounter for general adult medical examination without abnormal findings: Secondary | ICD-10-CM | POA: Diagnosis not present

## 2022-09-13 NOTE — Progress Notes (Signed)
Subjective:    Nancy [name] is a 41 y.o. female and is here for a comprehensive physical exam.  HPI  Health Maintenance Due  Topic Date Due   INFLUENZA VACCINE  07/13/2022    Acute Concerns: None  Chronic Issues: Thyroiditis -- TSH was just checked 9/18 and WNL. Her endocrinologist is retiring.  He is okay with [place] managing her TSH as long as it remains normal.  She is up-to-date on care with all of her providers.  Health Maintenance: Immunizations --up-to-date Colonoscopy -- n/a Mammogram -- n/a PAP -- up to date  Bone Density -- n/a Diet -- joined a 12-week weight loss program in her church Exercise -- was exercising regularly prior to palpitations  Sleep habits -- has a sleep study coming up to check for narcolepsy Mood -- no major concerns  UTD with dentist? - yes UTD with eye doctor? - yes  Weight history: Wt Readings from Last 10 Encounters:  09/13/22 197 lb (89.4 kg)  09/10/22 197 lb 6.4 oz (89.5 kg)  09/08/22 197 lb (89.4 kg)  09/07/22 200 lb 6.4 oz (90.9 kg)  04/26/22 200 lb 8 oz (90.9 kg)  03/08/22 197 lb 12.8 oz (89.7 kg)  03/05/22 200 lb 9.6 oz (91 kg)  02/03/22 195 lb 3.2 oz (88.5 kg)  02/01/22 194 lb 8 oz (88.2 kg)  10/12/21 192 lb (87.1 kg)   Body mass index is 30.4 kg/m. Patient's last menstrual period was 01/15/2015 (exact date).  Alcohol use:  reports no history of alcohol use.  Tobacco use:  Tobacco Use: Low Risk  (09/13/2022)   Patient History    Smoking Tobacco Use: Never    Smokeless Tobacco Use: Never    Passive Exposure: Not on file   Eligible for lung cancer screening? N/a     09/13/2022   11:04 AM  Depression screen PHQ 2/9  Decreased Interest 0  Down, Depressed, Hopeless 0  PHQ - 2 Score 0     Other providers/specialists: Patient Care Team: Inda Coke, [place] as PCP - General (Physician Assistant) Gynecology, [place] as Consulting Physician (Obstetrics and Gynecology) [facility], [place]. as Consulting Physician (Neurology) Sherrlyn Hock, MD as Consulting Physician (Endocrinology)    PMHx, SurgHx, SocialHx, Medications, and Allergies were reviewed in the Visit Navigator and updated as appropriate.   Past Medical History:  Diagnosis Date   Allergy    COVID    Eczema    Fatigue    Hashimoto's disease    Hypothyroidism, acquired, autoimmune    Migraines    Thyroiditis, autoimmune    Hashimotos    Vaginal delivery 2006, 2009     Past Surgical History:  Procedure Laterality Date   MOLE REMOVAL  12/13/2008   tongue growth  12/13/2008   WISDOM TOOTH EXTRACTION  12/13/1997     Family History  Problem Relation Age of Onset   Stroke Mother    Lung cancer Mother        Small cell   COPD Mother    Diabetes Mellitus II Mother    Anxiety disorder Mother    Cancer Mother    Stroke Maternal Grandmother    Arthritis Maternal Grandmother    Varicose Veins Maternal Grandmother    Heart disease Maternal Grandfather    Hashimoto's thyroiditis Daughter        Hashimoto's 2   Diabetes Son    Hypothyroidism Son    Breast cancer Other  Colon cancer Other    Migraines Neg Hx     Social History   Tobacco Use   Smoking status: Never   Smokeless tobacco: Never  Vaping Use   Vaping Use: Never used  Substance Use Topics   Alcohol use: No   Drug use: No    Review of Systems:   Review of Systems  Constitutional:  Negative for chills, fever, malaise/fatigue and weight loss.  HENT:  Negative for hearing loss, sinus pain and sore throat.   Respiratory:  Negative for cough and hemoptysis.   Cardiovascular:  Negative for chest pain, palpitations, leg swelling and PND.  Gastrointestinal:  Negative for abdominal pain, constipation, diarrhea, heartburn, nausea and vomiting.  Genitourinary:  Negative for dysuria, frequency and urgency.  Musculoskeletal:  Negative for back pain, myalgias and neck pain.  Skin:  Negative for itching and  rash.  Neurological:  Negative for dizziness, tingling, seizures and headaches.  Endo/Heme/Allergies:  Negative for polydipsia.  Psychiatric/Behavioral:  Negative for depression. The patient is not nervous/anxious.     Objective:   BP 100/62 (BP Location: Left Arm, Patient Position: Sitting, Cuff Size: Large)   Pulse 72   Temp 97.7 F (36.5 C) (Temporal)   Ht 5' 7.5" (1.715 m)   Wt 197 lb (89.4 kg)   LMP 01/15/2015 (Exact Date) Comment: LMP 2016  SpO2 97%   BMI 30.40 kg/m  Body mass index is 30.4 kg/m.   General Appearance:    Alert, cooperative, no distress, appears stated age  Head:    Normocephalic, without obvious abnormality, atraumatic  Eyes:    PERRL, conjunctiva/corneas clear, EOM's intact, fundi    benign, both eyes  Ears:    Normal TM's and external ear canals, both ears  Nose:   Nares normal, septum midline, mucosa normal, no drainage    or sinus tenderness  Throat:   Lips, mucosa, and tongue normal; teeth and gums normal  Neck:   Supple, symmetrical, trachea midline, no adenopathy;    thyroid:  no enlargement/tenderness/nodules; no carotid   bruit or JVD  Back:     Symmetric, no curvature, ROM normal, no CVA tenderness  Lungs:     Clear to auscultation bilaterally, respirations unlabored  Chest Wall:    No tenderness or deformity   Heart:    Regular rate and rhythm, S1 and S2 normal, no murmur, rub or gallop  Breast Exam:    Deferred  Abdomen:     Soft, non-tender, bowel sounds active all four quadrants,    no masses, no organomegaly  Genitalia:    Deferred  Extremities:   Extremities normal, atraumatic, no cyanosis or edema  Pulses:   2+ and symmetric all extremities  Skin:   Skin color, texture, turgor normal, no rashes or lesions  Lymph nodes:   Cervical, supraclavicular, and axillary nodes normal  Neurologic:   CNII-XII intact, normal strength, sensation and reflexes    throughout    Assessment/Plan:   Routine physical examination Today patient  counseled on age appropriate routine health concerns for screening and prevention, each reviewed and up to date or declined. Immunizations reviewed and up to date or declined.  Labs were not ordered as they were recently done in the ER.  The only thing that is needed is a lipid panel but we will recheck this in 6 months when she follows up with her thyroid.  Risk factors for depression reviewed and negative. Hearing function and visual acuity are intact. ADLs screened and addressed  as needed. Functional ability and level of safety reviewed and appropriate. Education, counseling and referrals performed based on assessed risks today. Patient provided with a copy of personalized plan for preventive services.  Hypothyroidism, acquired, autoimmune Follow-up in 6 months, sooner if concerns Low threshold to refer to endocrinology if her levels become unstable  Obesity, unspecified classification, unspecified obesity type, unspecified whether serious comorbidity present Continue efforts at healthy lifestyle   Inda Coke, PA-C [place]

## 2022-09-13 NOTE — Patient Instructions (Addendum)
It was great to see you!  *Mammogram is due in mid December at the breast center -- you can schedule this on MyChart  Let's plan to follow-up in 6 months for your cholesterol and thyroid.  Take care,  Aldona Bar

## 2022-09-14 ENCOUNTER — Ambulatory Visit (INDEPENDENT_AMBULATORY_CARE_PROVIDER_SITE_OTHER): Payer: BC Managed Care – PPO

## 2022-09-14 DIAGNOSIS — R002 Palpitations: Secondary | ICD-10-CM

## 2022-09-14 LAB — ECHOCARDIOGRAM COMPLETE
Area-P 1/2: 4.29 cm2
S' Lateral: 2.55 cm

## 2022-09-15 ENCOUNTER — Encounter: Payer: Self-pay | Admitting: Neurology

## 2022-09-21 DIAGNOSIS — F411 Generalized anxiety disorder: Secondary | ICD-10-CM | POA: Diagnosis not present

## 2022-09-29 ENCOUNTER — Ambulatory Visit (INDEPENDENT_AMBULATORY_CARE_PROVIDER_SITE_OTHER): Payer: BC Managed Care – PPO | Admitting: Neurology

## 2022-09-29 DIAGNOSIS — R5383 Other fatigue: Secondary | ICD-10-CM

## 2022-09-29 DIAGNOSIS — M6281 Muscle weakness (generalized): Secondary | ICD-10-CM

## 2022-09-29 DIAGNOSIS — G43711 Chronic migraine without aura, intractable, with status migrainosus: Secondary | ICD-10-CM

## 2022-09-29 DIAGNOSIS — G4710 Hypersomnia, unspecified: Secondary | ICD-10-CM

## 2022-09-29 DIAGNOSIS — R29898 Other symptoms and signs involving the musculoskeletal system: Secondary | ICD-10-CM

## 2022-09-29 DIAGNOSIS — G4719 Other hypersomnia: Secondary | ICD-10-CM

## 2022-09-30 ENCOUNTER — Other Ambulatory Visit: Payer: Self-pay | Admitting: Neurology

## 2022-09-30 ENCOUNTER — Ambulatory Visit: Payer: BC Managed Care – PPO | Admitting: Neurology

## 2022-09-30 ENCOUNTER — Other Ambulatory Visit (INDEPENDENT_AMBULATORY_CARE_PROVIDER_SITE_OTHER): Payer: Self-pay

## 2022-09-30 DIAGNOSIS — Z79899 Other long term (current) drug therapy: Secondary | ICD-10-CM | POA: Diagnosis not present

## 2022-09-30 DIAGNOSIS — G4719 Other hypersomnia: Secondary | ICD-10-CM

## 2022-09-30 DIAGNOSIS — D8989 Other specified disorders involving the immune mechanism, not elsewhere classified: Secondary | ICD-10-CM

## 2022-09-30 DIAGNOSIS — R29898 Other symptoms and signs involving the musculoskeletal system: Secondary | ICD-10-CM

## 2022-09-30 DIAGNOSIS — R5383 Other fatigue: Secondary | ICD-10-CM

## 2022-09-30 DIAGNOSIS — Z0289 Encounter for other administrative examinations: Secondary | ICD-10-CM

## 2022-09-30 DIAGNOSIS — G43711 Chronic migraine without aura, intractable, with status migrainosus: Secondary | ICD-10-CM

## 2022-09-30 DIAGNOSIS — M6281 Muscle weakness (generalized): Secondary | ICD-10-CM

## 2022-10-07 LAB — COMPREHENSIVE DRUG ANALYSIS,UR

## 2022-10-08 ENCOUNTER — Other Ambulatory Visit: Payer: Self-pay | Admitting: Cardiology

## 2022-10-12 ENCOUNTER — Encounter: Payer: Self-pay | Admitting: Neurology

## 2022-10-12 NOTE — Procedures (Signed)
[facility] MSLT REPORT DOS : 09-30-2022 [name] had a preceding PSG ( 09-29-2022) at [facility] the night before this MSLT was performed.  During the PSG, 6 hours and 37 minutes of sleep time were recorded, with a REM sleep latency of 9 minutes and REM sleep latency of 127 minutes.   4 REM sleep phases were recorded.   Name: Nancy, [name] BMI: 31 Physician: ,   ID: [id number] Height: 63 in Technician: Gaylyn Cheers  Sex: Female Weight: 200 lb Record: [id number]  Age: 41 [07-12-1981] Date: 09/30/2022 Scorer: Barrie Lyme   Nap 1 Nap Start: 08:29:50 AM Nap End: 08:49:38 AM    Latency to Sleep Onset: 20.91m Total Sleep Time: 0.31m Stg Dur REM N1 N2 N3   0.32m 0.36m 0.6m 0.55m    Nap 2 Nap Start: 10:22:45 AM Nap End: 10:56:23 AM    Latency to Sleep Onset: 18.56m Total Sleep Time: 15.3m Stg Dur REM N1 N2 N3   0.15m 5.15m 9.70m 0.65m    Nap 3 Nap Start: 12:08:27 PM Nap End: 40:98:11 PM    Latency to Sleep Onset: 13.60m Total Sleep Time: 14.43m Stg Dur REM N1 N2 N3   0.5m 2.72m 12.27m 0.80m    Nap 4 Nap Start: 02:11:38 PM Nap End: 02:33:14 PM    Latency to Sleep Onset: 6.65m Total Sleep Time: 14.45m Stg Dur REM N1 N2 N3   0.57m 2.33m 12.2m 0.51m    This MSLT showed 3 out of 4 nap p opportunities with actual sleep onset but none with REM sleep onset.  The first nap opportunity had no sleep onset, the sleep latency became progressively shorter in the following 3 naps.    Mean Sleep Latency: 14.6 minutes Number of Sleep Onset REM Periods: 0 This result is not indicative of narcolepsy and does not support a pathological degree of daytime sleepiness. Normal MSLT.  Larey Seat, MD - 10-12-2022

## 2022-10-12 NOTE — Procedures (Signed)
[facility] POLYSOMNOGRAPHY  INTERPRETATION REPORT   STUDY DATE:  09/29/2022     PATIENT NAME:  Nancy [name]         DATE OF BIRTH:  1981/06/09  PATIENT ID:  [id number]    TYPE OF STUDY:  PSG  READING PHYSICIAN: Melvyn Novas, MD REFERRED BY: Dr Lucia Gaskins, MD  SCORING TECHNICIAN: Domingo Cocking, RPSGT   HISTORY:  This 41 year-old Female patient was seen on 04-26-2022.  ADDITIONAL INFORMATION:  The Epworth Sleepiness Scale endorsed at at  12 /24 points (scores above or equal to 10 are suggestive of hypersomnolence). FSS endorsed at  45 /63 points.  Height: 67 in Weight: 200 lb (BMI 31) Neck Size: 14 in  MEDICATIONS: Botox, Citracal calcium+D, Zyrtec, EpiPen, Synthroid, Naprosyn, Nuvaring, Zofran-ODT, Imitrex, Desyrel, Trokendi TECHNICAL DESCRIPTION: A registered sleep technologist ( RPSGT)  was in attendance for the duration of the recording.  Data collection, scoring, video monitoring, and reporting were performed in compliance with the AASM Manual for the Scoring of Sleep and Associated Events; (Hypopnea is scored based on the criteria listed in Section VIII D. 1b in the AASM Manual V2.6 using a 4% oxygen desaturation rule or Hypopnea is scored based on the criteria listed in Section VIII D. 1a in the AASM Manual V2.6 using 3% oxygen desaturation and /or arousal rule).   SLEEP CONTINUITY AND SLEEP ARCHITECTURE:  Lights-out was at 22:23: and lights-on at  06:19:, with  7.9 minutes of recording time. Total sleep time ( TST) was 396.5 minutes with a decreased sleep efficiency at 83.3%. WASO time was 70 minutes.  BODY POSITION:  TST was divided  between the following sleep positions: 39.5% supine;  60.5% lateral;  0% prone. Duration of total sleep and percent of total sleep in their respective position is as follows: supine 156 minutes (39%), non-supine 240 minutes (61%); right 102 minutes (26%), left 138 minutes (35%), and prone 00 minutes (0%). Total  supine REM sleep time was 35 minutes (49% of total REM sleep). Sleep latency was normal at 9.0 minutes.  REM sleep latency was increased at 127.0 minutes. Of the total sleep time, the percentage of stage N1 sleep was 8.6%, stage N2 sleep was 65%, stage N3 sleep was 8.1%, and REM sleep was 18.2%. There were 4 Stage R periods observed on this study night, 28 awakenings (i.e. transitions to Stage W from any sleep stage), and 96 total stage transitions.  RESPIRATORY MONITORING:   Based on AASM criteria (using a 3% oxygen desaturation and /or arousal rule for scoring hypopneas), there were 19 apneas (14 obstructive; 1 central; 4 mixed), and 4 hypopneas. Apnea index was 2.9. Hypopnea index was 0.6. The AHI (apnea-hypopnea index) was 3.5/h overall (7.3 supine, 0 non-supine; 0.0 REM, 0.0 supine REM).  There were 0 respiratory effort-related arousals (RERAs).  OXIMETRY: Oxyhemoglobin Saturation Nadir during sleep was at  91%) from a mean of 96%.  Of the Total sleep time (TST)   hypoxemia (=<88%) was present for  0.1 minutes, or 0.0% of total sleep time.  LIMB MOVEMENTS: There were 0 periodic limb movements of sleep (0.0/hr), of which 0 (0.0/hr) were associated with an arousal. AROUSAL: There were 73 arousals in total, for an arousal index of 11 hour.  Of these, 13 were identified as respiratory-related arousals (2 /h), 0 were PLM-related arousals (0 /h), and 75 were non-specific arousals (11 /h). EEG:  PSG EEG was of normal amplitude and frequency, with symmetric manifestation of  sleep stages. EKG: The electrocardiogram documented  NSR.  The average heart rate during sleep was 80 bpm.  The heart rate during sleep varied between a minimum of 71and  a maximum of  103 bpm.    IMPRESSION: Valid PSG for MSLT to follow.  1) Sleep disordered breathing was not present.    2) Periodic limb movements were not present 3) REM sleep latency was not shortened.  Total sleep time was within normal limits at 396.5 minutes.   Sleep efficiency was decreased at 83.3%.     Larey Seat,  MD

## 2022-10-15 ENCOUNTER — Other Ambulatory Visit: Payer: Self-pay | Admitting: Physician Assistant

## 2022-10-15 DIAGNOSIS — Z1231 Encounter for screening mammogram for malignant neoplasm of breast: Secondary | ICD-10-CM

## 2022-10-18 ENCOUNTER — Encounter: Payer: Self-pay | Admitting: Nurse Practitioner

## 2022-10-18 ENCOUNTER — Ambulatory Visit (INDEPENDENT_AMBULATORY_CARE_PROVIDER_SITE_OTHER): Payer: BC Managed Care – PPO | Admitting: Nurse Practitioner

## 2022-10-18 VITALS — BP 114/74 | HR 96 | Ht 67.25 in | Wt 198.0 lb

## 2022-10-18 DIAGNOSIS — Z01419 Encounter for gynecological examination (general) (routine) without abnormal findings: Secondary | ICD-10-CM | POA: Diagnosis not present

## 2022-10-18 DIAGNOSIS — E2839 Other primary ovarian failure: Secondary | ICD-10-CM | POA: Diagnosis not present

## 2022-10-18 DIAGNOSIS — Z79890 Hormone replacement therapy (postmenopausal): Secondary | ICD-10-CM | POA: Diagnosis not present

## 2022-10-18 MED ORDER — ESTRADIOL 0.05 MG/24HR TD PTWK: 0.0500 mg | MEDICATED_PATCH | TRANSDERMAL | 3 refills | Status: DC

## 2022-10-18 MED ORDER — PROGESTERONE MICRONIZED 100 MG PO CAPS: 100.0000 mg | ORAL_CAPSULE | Freq: Every day | ORAL | 3 refills | Status: DC

## 2022-10-18 NOTE — Progress Notes (Signed)
Nancy [name] 03-07-1981 [id number]   History:  41 y.o. G2P0002 presents for annual exam. POI at age 31. Nuvaring continuously for hormone replacement and management of migraines with good relief. Insurance would not cover HRT at that time, so Nuvaring was used as an alternative. Normal pap history. Auras with migraines, is doing botox now for management. Hypothyroidism managed by endocrinology.   Gynecologic History Patient's last menstrual period was 01/15/2015 (exact date).   Contraception: NuvaRing vaginal inserts, post menopausal status, and vasectomy Sexually active: Yes  Health Maintenance Last Pap: 09/26/2018. Results were: Constance Holster neg HPV Last mammogram: 11/24/2021. Results were. Normal Last colonoscopy: Not indicated Last Dexa: Not indicated  Past medical history, past surgical history, family history and social history were all reviewed and documented in the EPIC chart. Married. 42 yo daughter, 83 yo son. -> 42 yo son. Son T1DM. Works PT at [employer], husband is Theme park manager.   ROS:  A ROS was performed and pertinent positives and negatives are included.  Exam:  Vitals:   10/18/22 0914  BP: 114/74  Pulse: 96  SpO2: 97%  Weight: 198 lb (89.8 kg)  Height: 5' 7.25" (1.708 m)     Body mass index is 30.78 kg/m.  General appearance:  Normal Thyroid:  Symmetrical, normal in size, without palpable masses or nodularity. Respiratory  Auscultation:  Clear without wheezing or rhonchi Cardiovascular  Auscultation:  Regular rate, without rubs, murmurs or gallops  Edema/varicosities:  Not grossly evident Abdominal  Soft,nontender, without masses, guarding or rebound.  Liver/spleen:  No organomegaly noted  Hernia:  None appreciated  Skin  Inspection:  Grossly normal. Slight hyperpigmentation or right groin. No redness Breasts: Examined lying and sitting.   Right: Without masses, retractions, discharge or axillary adenopathy.   Left: Without masses, retractions, discharge or  axillary adenopathy. Genitourinary   Inguinal/mons:  Normal without inguinal adenopathy  External genitalia:  Normal appearing vulva with no masses, tenderness, or lesions  BUS/Urethra/Skene's glands:  Normal  Vagina:  Normal appearing with normal color and discharge, no lesions  Cervix:  Normal appearing without discharge or lesions  Uterus:  Normal in size, shape and contour.  Midline and mobile, nontender  Adnexa/parametria:     Rt: Normal in size, without masses or tenderness.   Lt: Normal in size, without masses or tenderness.  Anus and perineum: Normal  Patient informed chaperone available to be present for breast and pelvic exam. Patient has requested no chaperone to be present. Patient has been advised what will be completed during breast and pelvic exam.    Assessment/Plan:  41 y.o. G2P0002 for annual exam.   Well female exam with routine gynecological exam - Education provided on SBEs, importance of preventative screenings, current guidelines, high calcium diet, regular exercise, and multivitamin daily. Labs with PCP and endocrinology.  Hormone replacement therapy - Plan: estradiol (CLIMARA) 0.05 mg/24hr patch weekly, progesterone (PROMETRIUM) 100 MG capsule nightly. Has been using Nuvaring continuously due to insurance not covering HRT at that time. Recommend switching to bioidentical hormones for heart and bone health. Patch preferred due to h/o migraine with aura. Discussed proper use of patch and use of progesterone for uterine protection.   Premature ovarian insufficiency - Plan: estradiol (CLIMARA) 0.05 mg/24hr patch weekly, progesterone (PROMETRIUM) 100 MG capsule nightly. At age 41.  Screening for cervical cancer - Normal pap history. Will repeat pap at 5-year interval per guidelines.   Screening for breast cancer - Normal mammogram history. Continue annual screenings. Normal breast exam today.   Follow up  in 1 year for annual.        Tamela Gammon [facility], 10:21  AM 10/18/2022

## 2022-10-19 DIAGNOSIS — F411 Generalized anxiety disorder: Secondary | ICD-10-CM | POA: Diagnosis not present

## 2022-10-22 ENCOUNTER — Other Ambulatory Visit (INDEPENDENT_AMBULATORY_CARE_PROVIDER_SITE_OTHER): Payer: Self-pay

## 2022-10-22 DIAGNOSIS — E063 Autoimmune thyroiditis: Secondary | ICD-10-CM

## 2022-10-27 IMAGING — CT CT ANGIO CHEST
2 of 7 series · 13 of 36 positions shown · IV contrast (omnipaque)
Comparison: None.

CLINICAL DATA: Chest tightness, shortness of breath

EXAM:
CT ANGIOGRAPHY CHEST WITH CONTRAST
TECHNIQUE: Multidetector CT imaging of the chest was performed using the
standard protocol during bolus administration of intravenous
contrast. Multiplanar CT image reconstructions and MIPs were
obtained to evaluate the vascular anatomy.
CONTRAST:  78mL OMNIPAQUE IOHEXOL 350 MG/ML SOLN

[Series 5: pe axial thins · axial · 0.76mm/px · z∈[+1016,+1292]mm · 12 of 328 slices shown]
[im 26/328  lung]
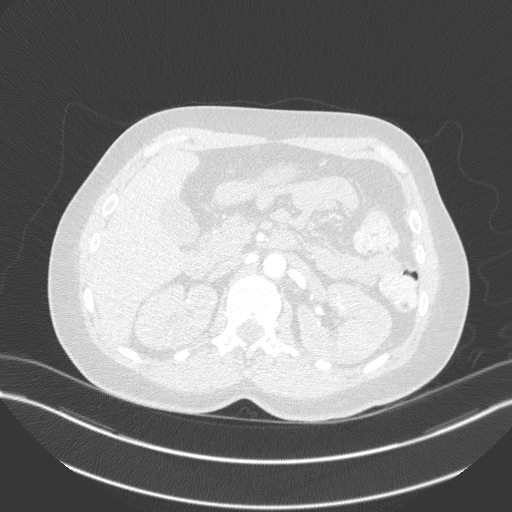
[im 51/328  mediastinal]
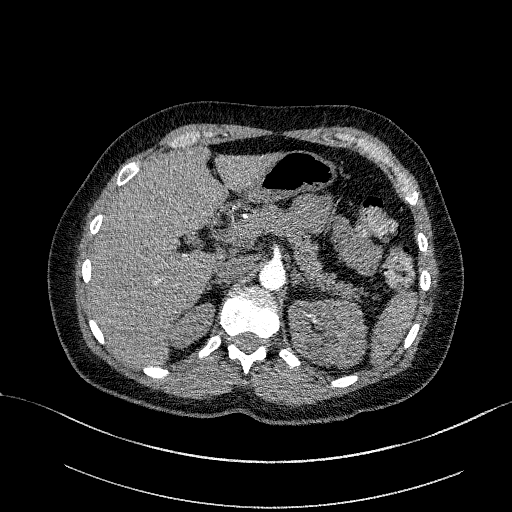
[im 76/328  lung]
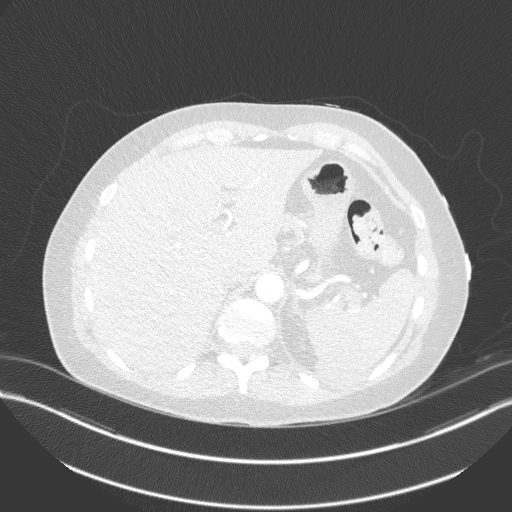
[im 101/328  mediastinal]
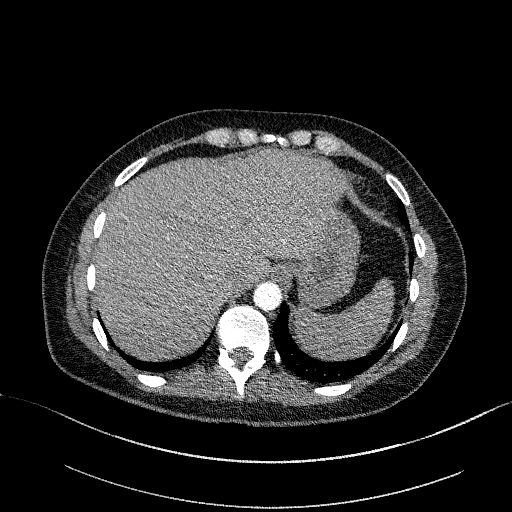
[im 126/328  lung]
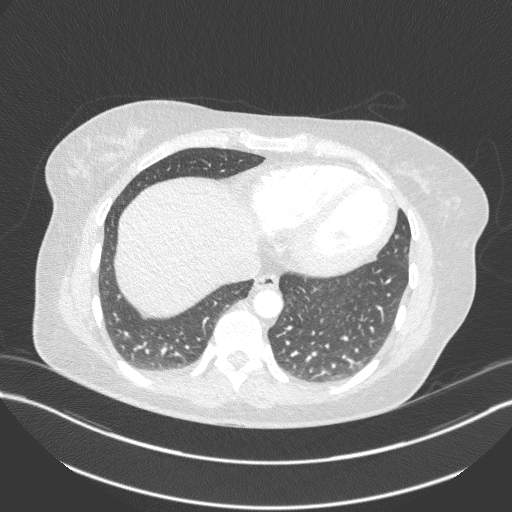
[im 151/328  mediastinal]
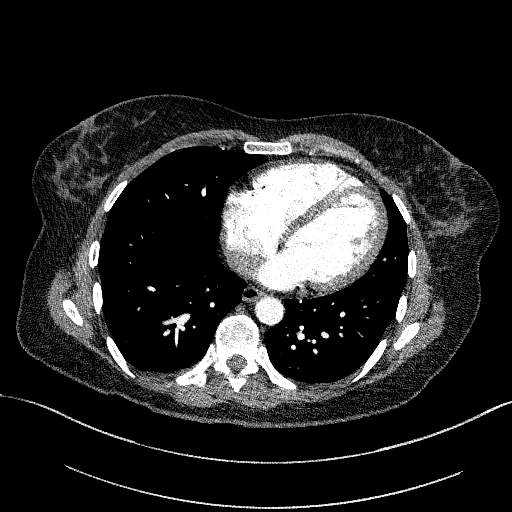
[im 177/328  lung]
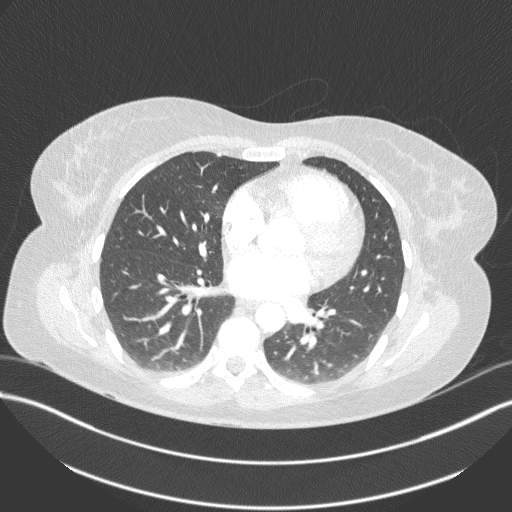
[im 202/328  mediastinal]
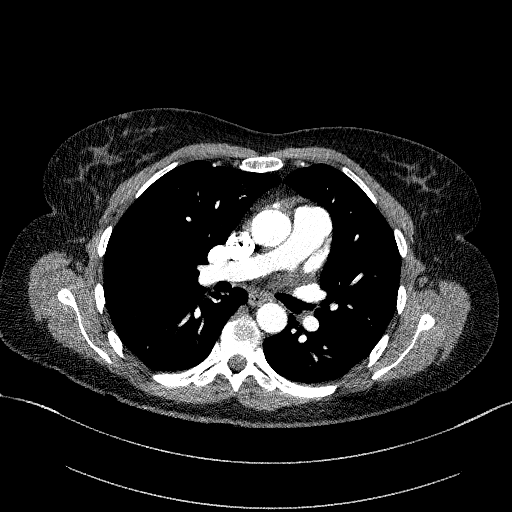
[im 227/328  lung]
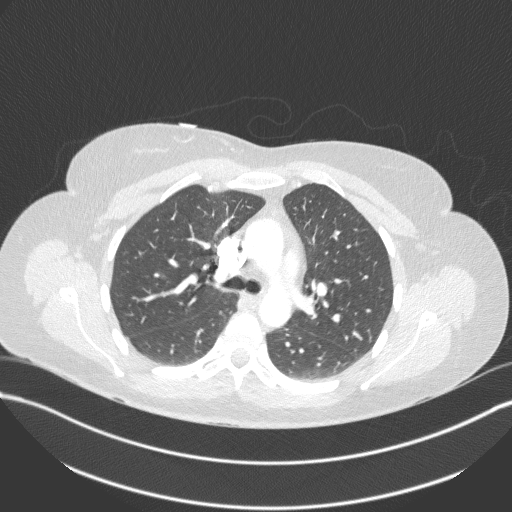
[im 252/328  mediastinal]
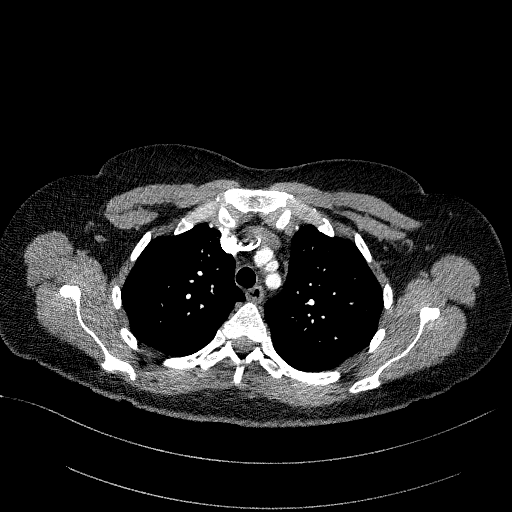
[im 277/328  lung]
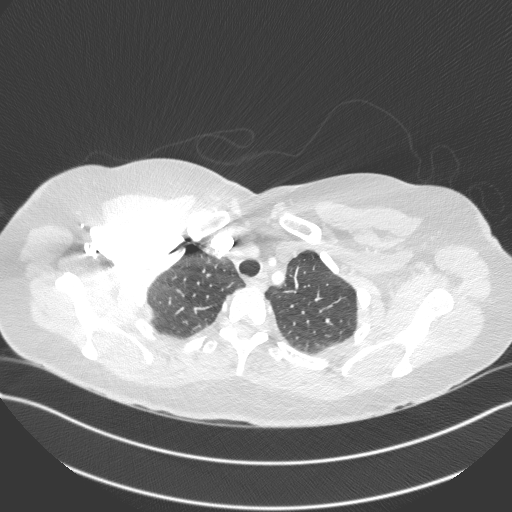
[im 302/328  mediastinal]
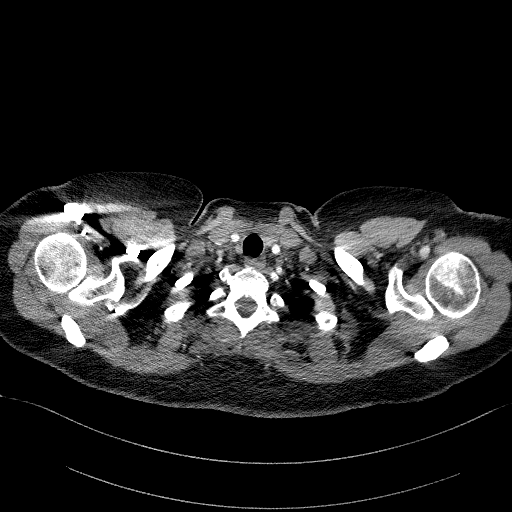

[Series 7: cor soft · coronal · 0.62mm/px · 1 of 128 slices shown]
[im 64/128  mediastinal]
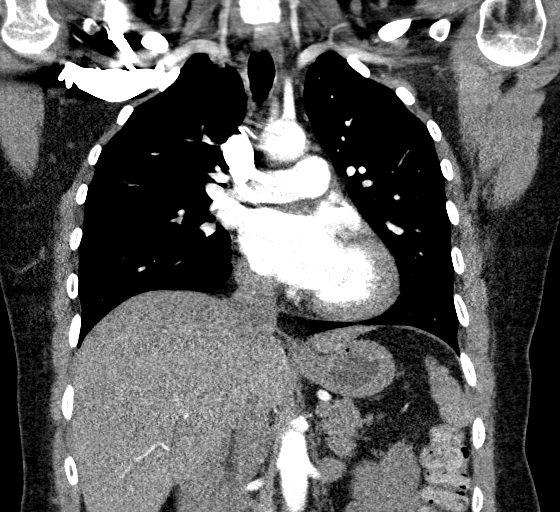

[13 of 36 positions shown; findings below may reference images not displayed]

FINDINGS: Cardiovascular: No filling defects in the pulmonary arteries to
suggest pulmonary emboli. Heart is normal size. Aorta is normal
caliber.

Mediastinum/Nodes: No mediastinal, hilar, or axillary adenopathy.
Trachea and esophagus are unremarkable. Thyroid unremarkable.

Lungs/Pleura: Lungs are clear. No focal airspace opacities or
suspicious nodules. No effusions.

Upper Abdomen: Imaging into the upper abdomen demonstrates no acute
findings.

Musculoskeletal: No acute bony abnormality. Chest wall soft tissues
are unremarkable.

Review of the MIP images confirms the above findings.
IMPRESSION: No evidence of pulmonary embolus.

No acute cardiopulmonary disease.

## 2022-10-28 NOTE — Telephone Encounter (Signed)
Called to schedule delivery for 11/20 Botox appt, Liborio Nixon with [facility] states that delivery will be pending insurance verification and they will update [place] within 24-48 hours. States we can give them a call Monday if we have not heard anything back.

## 2022-10-28 NOTE — Telephone Encounter (Signed)
noted 

## 2022-11-01 ENCOUNTER — Ambulatory Visit: Payer: BC Managed Care – PPO | Admitting: Adult Health

## 2022-11-01 NOTE — Telephone Encounter (Signed)
I called and spoke to [place] with [employer] [phone] opt 3, opt 3.  From the PA that was done [facility] 02-18-22 thru 01-19-2023, he stated that the NPI # [id number] for the specialty pharmacy was deactivated. I relayed that the the [facility] NPI used last 08-05-2022 was verbally given [id number] (and this was checked at billing did go thru).  If pharmacy needs the NPI [id number] on the approval letter, then a new PA will need to be done.  I called and spoke to [place] at [facility]  I relayed that pt is scheduled and she will run as urgent.  I relayed appreciation.

## 2022-11-01 NOTE — Telephone Encounter (Signed)
Spoke with [facility]. I was told the verification process is not finished yet. They still need to verify authorization by contacting pt's plan. They have sent an email internally to make this request urgent. They are aware the pt is supposed to be seen this afternoon. I was told I would get a call back by end of today or tomorrow regarding next steps.   At this point we will need to r/s the patient's appt.

## 2022-11-02 DIAGNOSIS — G43009 Migraine without aura, not intractable, without status migrainosus: Secondary | ICD-10-CM | POA: Diagnosis not present

## 2022-11-02 NOTE — Telephone Encounter (Signed)
I spoke with Nancy [name] at [facility]. Botox is scheduled to be delivered tomorrow morning 11/22 via [employer] overnight priority (signature required). Order # [id number]. If they cannot deliver tomorrow the next available delivery date would be 11/28.

## 2022-11-07 NOTE — Progress Notes (Signed)
Cardiology Office Note   Date:  11/10/2022   ID:  cleva camero, DOB [birth year], MRN [id number]  PCP:  Jarold Motto, PA  Cardiologist:   None Referring:    Chief Complaint  Patient presents with   Palpitations      History of Present Illness: Nancy [name] is a 41 y.o. female who presents for evaluation of weakness.   She had chronic fatigue since COVID.  She had previously worn a monitor demonstrating some SVT with the longest lasting about 8 beats.  She had an echo which was unremarkable.  When I saw her I gave her propranolol but she actually has not had to use this.  She has been taking over-the-counter magnesium.  This has helped with her palpitations.  She is actually no longer getting any of these.  She says her fatigue is gone away.  She denies any chest pressure, neck or arm discomfort.  She has no new shortness of breath, PND or orthopnea.  She is actually been able to exercise with her daughter.  She is walking 1/2-hour 5 days a week.   Past Medical History:  Diagnosis Date   Allergy    COVID    Eczema    Fatigue    Hashimoto's disease    Hypothyroidism, acquired, autoimmune    Migraines    Thyroiditis, autoimmune    Hashimotos    Vaginal delivery 2006, 2009    Past Surgical History:  Procedure Laterality Date   MOLE REMOVAL  12/13/2008   tongue growth  12/13/2008   WISDOM TOOTH EXTRACTION  12/13/1997     Current Outpatient Medications  Medication Sig Dispense Refill   Botulinum Toxin Type A (BOTOX) 200 units SOLR PROVIDER TO INJECT 155 UNITS INTO HEAD AND NECK MUSCLES EVERY 12 WEEKS.  DISCARD REMAINDER. 1 each 3   Calcium-Magnesium-Vitamin D (CITRACAL CALCIUM+D) 600-40-500 MG-MG-UNIT TB24 Take 1 tablet at dinner and 1 tablet at bedtime. 90 tablet 1   cetirizine (ZYRTEC) 10 MG tablet Take 10 mg by mouth daily as needed.     EPINEPHrine 0.3 mg/0.3 mL IJ SOAJ injection Inject 0.3 mg into the muscle as needed for anaphylaxis. 2  each 1   estradiol (CLIMARA) 0.05 mg/24hr patch Place 1 patch (0.05 mg total) onto the skin once a week. 12 patch 3   levothyroxine (SYNTHROID) 112 MCG tablet TAKE 1 1/2 TABLETS BY MOUTH DAILY 180 tablet 3   Magnesium 500 MG TABS Take 1 tablet by mouth daily in the afternoon.     naproxen (NAPROSYN) 500 MG tablet Take 500 mg by mouth as needed.     ondansetron (ZOFRAN-ODT) 4 MG disintegrating tablet DISSOLVE 1 TABLET(4 MG) ON THE TONGUE EVERY 8 HOURS AS NEEDED FOR NAUSEA OR VOMITING 30 tablet 0   progesterone (PROMETRIUM) 100 MG capsule Take 1 capsule (100 mg total) by mouth daily. 90 capsule 3   propranolol (INDERAL) 10 MG tablet TAKE 1 TABLET(10 MG) BY MOUTH EVERY 8 HOURS AS NEEDED 270 tablet 3   SUMAtriptan (IMITREX) 100 MG tablet TAKE 1 TABLET(100 MG) BY MOUTH 1 TIME. MAY REPEAT IN 2 HOURS IF HEADACHE PERSISTS OR RECURS 10 tablet 5   Topiramate ER (TROKENDI XR) 50 MG CP24 TAKE 1 CAPSULE BY MOUTH EVERY NIGHT AT BEDTIME, TAKE WITH 100MG  CAPSULE FOR A TOTAL DOSE OF 150MG  30 capsule 11   traZODone (DESYREL) 50 MG tablet TAKE 1 TABLET(50 MG) BY MOUTH AT BEDTIME AS NEEDED FOR SLEEP 30  tablet 5   TROKENDI XR 100 MG CP24 TAKE 1 CAPSULE BY MOUTH EVERY NIGHT AT BEDTIME WITH 50MG  FOR TOTAL DOSE OF 150MG  30 capsule 11   No current facility-administered medications for this visit.    Allergies:   Codeine    ROS:  Please see the history of present illness.   Otherwise, review of systems are positive for none.   All other systems are reviewed and negative.    PHYSICAL EXAM: VS:  BP 116/70   Pulse 82   Ht 5' 7.5" (1.715 m)   Wt 199 lb 9.6 oz (90.5 kg)   LMP 01/15/2015 (Exact Date) Comment: LMP 2016  SpO2 98%   BMI 30.80 kg/m  , BMI Body mass index is 30.8 kg/m. GENERAL:  Well appearing NECK:  No jugular venous distention, waveform within normal limits, carotid upstroke brisk and symmetric, no bruits, no thyromegaly LUNGS:  Clear to auscultation bilaterally CHEST:  Unremarkable HEART:  PMI not  displaced or sustained,S1 and S2 within normal limits, no S3, no S4, no clicks, no rubs, no murmurs ABD:  Flat, positive bowel sounds normal in frequency in pitch, no bruits, no rebound, no guarding, no midline pulsatile mass, no hepatomegaly, no splenomegaly EXT:  2 plus pulses throughout, no edema, no cyanosis no clubbing   EKG:  EKG is not ordered today.    Recent Labs: 02/01/2022: ALT 16 08/30/2022: TSH 1.21 09/07/2022: BUN 12; Creatinine, Ser 0.80; Hemoglobin 13.2; Magnesium 2.1; Platelets 203; Potassium 3.9; Sodium 141    Lipid Panel    Component Value Date/Time   CHOL 178 09/07/2021 1114   TRIG 130.0 09/07/2021 1114   HDL 65.20 09/07/2021 1114   CHOLHDL 3 09/07/2021 1114   VLDL 26.0 09/07/2021 1114   LDLCALC 87 09/07/2021 1114   LDLCALC 72 09/01/2020 1027      Wt Readings from Last 3 Encounters:  11/10/22 199 lb 9.6 oz (90.5 kg)  10/18/22 198 lb (89.8 kg)  09/13/22 197 lb (89.4 kg)      Other studies Reviewed: Additional studies/ records that were reviewed today include: None Review of the above records demonstrates:  Please see elsewhere in the note.     ASSESSMENT AND PLAN:  Palpitations:   She no longer has palpitations.  She can continue the over-the-counter magnesium.  No further workup is suggested.   Current medicines are reviewed at length with the patient today.  The patient does not have concerns regarding medicines.  The following changes have been made: None  Labs/ tests ordered today include:  None  No orders of the defined types were placed in this encounter.    Disposition:   FU with me as needed.   Signed, 13/06/23, MD  11/10/2022 5:04 PM    [facility]

## 2022-11-08 ENCOUNTER — Ambulatory Visit (INDEPENDENT_AMBULATORY_CARE_PROVIDER_SITE_OTHER): Payer: BC Managed Care – PPO | Admitting: Adult Health

## 2022-11-08 DIAGNOSIS — G43711 Chronic migraine without aura, intractable, with status migrainosus: Secondary | ICD-10-CM

## 2022-11-08 MED ORDER — ONABOTULINUMTOXINA 200 UNITS IJ SOLR
155.0000 [IU] | Freq: Once | INTRAMUSCULAR | Status: AC
  Administered 2022-11-08: 155 [IU] via INTRAMUSCULAR

## 2022-11-08 NOTE — Progress Notes (Signed)
11/08/22: Reports that Botox continues to work well for her.  Weather continues to be a trigger for her migraines.  Remains on Trokendi 150 mg daily and uses Imitrex for abortive therapy  08/05/2022: Botox continues to work well for her.  She reports that if there is a bad storm she typically will get a migraine otherwise she has been doing well  05/11/2022: botox continues to work well. Trigger for migraines is typically weather related   BOTOX PROCEDURE NOTE FOR MIGRAINE HEADACHE    Contraindications and precautions discussed with patient(above). Aseptic procedure was observed and patient tolerated procedure. Procedure performed by Ward Givens, NP  The condition has existed for more than 6 months, and pt does not have a diagnosis of ALS, Myasthenia Gravis or Lambert-Eaton Syndrome.  Risks and benefits of injections discussed and pt agrees to proceed with the procedure.  Written consent obtained  These injections are medically necessary. ]These injections do not cause sedations or hallucinations which the oral therapies may cause.  Indication/Diagnosis: chronic migraine BOTOX(J0585) injection was performed according to protocol by [employer]er[employer]. 200 units of BOTOX was dissolved into 4 cc NS.    Botox- 200 units x 1 vial Lot: [id number] Expiration: 02/2025 NDC: [id number]   Bacteriostatic 0.9% Sodium Chloride- 83mL total Lot: [id number] Expiration: 08/14/2023 NDC: [id number]   Dx: [id number]    S/P  Description of procedure:  The patient was placed in a sitting position. The standard protocol was used for Botox as follows, with 5 units of Botox injected at each site:   -Procerus muscle, midline injection  -Corrugator muscle, bilateral injection  -Frontalis muscle, bilateral injection, with 2 sites each side, medial injection was performed in the upper one third of the frontalis muscle, in the region vertical from the medial inferior edge of the superior orbital rim. The lateral  injection was again in the upper one third of the forehead vertically above the lateral limbus of the cornea, 1.5 cm lateral to the medial injection site.  -Temporalis muscle injection, 4 sites, bilaterally. The first injection was 3 cm above the tragus of the ear, second injection site was 1.5 cm to 3 cm up from the first injection site in line with the tragus of the ear. The third injection site was 1.5-3 cm forward between the first 2 injection sites. The fourth injection site was 1.5 cm posterior to the second injection site.  -Occipitalis muscle injection, 3 sites, bilaterally. The first injection was done one half way between the occipital protuberance and the tip of the mastoid process behind the ear. The second injection site was done lateral and superior to the first, 1 fingerbreadth from the first injection. The third injection site was 1 fingerbreadth superiorly and medially from the first injection site.  -Cervical paraspinal muscle injection, 2 sites, bilateral knee first injection site was 1 cm from the midline of the cervical spine, 3 cm inferior to the lower border of the occipital protuberance. The second injection site was 1.5 cm superiorly and laterally to the first injection site.  -Trapezius muscle injection was performed at 3 sites, bilaterally. The first injection site was in the upper trapezius muscle halfway between the inflection point of the neck, and the acromion. The second injection site was one half way between the acromion and the first injection site. The third injection was done between the first injection site and the inflection point of the neck.   Will return for repeat injection in 3 months.   A 200 units  of Botox was used, 155 units were injected, the rest of the Botox was wasted. The patient tolerated the procedure well, there were no complications of the above procedure.  Butch Penny, MSN, NP-C 11/08/2022, 3:36 PM [facility] [address] [place], [place] [address] [phone]

## 2022-11-08 NOTE — Progress Notes (Signed)
Botox- 200 units x 1 vial Lot: [id number] Expiration: 02/2025 NDC: 0981-1914-78  Bacteriostatic 0.9% Sodium Chloride- 25mL total Lot: [id number] Expiration: 08/14/2023 NDC: 3086-5784-69  Dx: G29.528 S/P   Botox Consent Signed

## 2022-11-10 ENCOUNTER — Encounter: Payer: Self-pay | Admitting: Cardiology

## 2022-11-10 ENCOUNTER — Ambulatory Visit: Payer: BC Managed Care – PPO | Attending: Cardiology | Admitting: Cardiology

## 2022-11-10 VITALS — BP 116/70 | HR 82 | Ht 67.5 in | Wt 199.6 lb

## 2022-11-10 DIAGNOSIS — R002 Palpitations: Secondary | ICD-10-CM

## 2022-11-10 NOTE — Patient Instructions (Signed)
    Follow-Up: At [facility], you and your health needs are our priority.  As part of our continuing mission to provide you with exceptional heart care, we have created designated Provider Care Teams.  These Care Teams include your primary Cardiologist (physician) and Advanced Practice Providers (APPs -  Physician Assistants and Nurse Practitioners) who all work together to provide you with the care you need, when you need it.  We recommend signing up for the patient portal called "MyChart".  Sign up information is provided on this After Visit Summary.  MyChart is used to connect with patients for Virtual Visits (Telemedicine).  Patients are able to view lab/test results, encounter notes, upcoming appointments, etc.  Non-urgent messages can be sent to your provider as well.   To learn more about what you can do with MyChart, go to [email or web address].    Your next appointment:    AS NEEDED 

## 2022-11-23 DIAGNOSIS — F411 Generalized anxiety disorder: Secondary | ICD-10-CM | POA: Diagnosis not present

## 2022-12-17 ENCOUNTER — Ambulatory Visit
Admission: RE | Admit: 2022-12-17 | Discharge: 2022-12-17 | Disposition: A | Discharge status: Home / Self Care | Payer: BC Managed Care – PPO | Source: Ambulatory Visit

## 2022-12-17 DIAGNOSIS — Z1231 Encounter for screening mammogram for malignant neoplasm of breast: Secondary | ICD-10-CM | POA: Diagnosis not present

## 2022-12-20 ENCOUNTER — Other Ambulatory Visit: Payer: Self-pay | Admitting: Physician Assistant

## 2022-12-20 DIAGNOSIS — R928 Other abnormal and inconclusive findings on diagnostic imaging of breast: Secondary | ICD-10-CM

## 2022-12-21 DIAGNOSIS — F411 Generalized anxiety disorder: Secondary | ICD-10-CM | POA: Diagnosis not present

## 2022-12-28 ENCOUNTER — Ambulatory Visit
Admission: RE | Admit: 2022-12-28 | Discharge: 2022-12-28 | Disposition: A | Discharge status: Home / Self Care | Payer: BC Managed Care – PPO | Source: Ambulatory Visit | Attending: Physician Assistant | Admitting: Physician Assistant

## 2022-12-28 DIAGNOSIS — R928 Other abnormal and inconclusive findings on diagnostic imaging of breast: Secondary | ICD-10-CM | POA: Diagnosis not present

## 2022-12-28 DIAGNOSIS — N6489 Other specified disorders of breast: Secondary | ICD-10-CM | POA: Diagnosis not present

## 2022-12-29 DIAGNOSIS — E039 Hypothyroidism, unspecified: Secondary | ICD-10-CM | POA: Diagnosis not present

## 2022-12-29 DIAGNOSIS — E310 Autoimmune polyglandular failure: Secondary | ICD-10-CM | POA: Diagnosis not present

## 2022-12-29 DIAGNOSIS — R002 Palpitations: Secondary | ICD-10-CM | POA: Diagnosis not present

## 2022-12-29 DIAGNOSIS — Z1331 Encounter for screening for depression: Secondary | ICD-10-CM | POA: Diagnosis not present

## 2023-01-04 ENCOUNTER — Encounter: Payer: Self-pay | Admitting: Neurology

## 2023-01-05 ENCOUNTER — Other Ambulatory Visit: Payer: Self-pay | Admitting: *Deleted

## 2023-01-05 MED ORDER — BOTOX 200 UNITS IJ SOLR: INTRAMUSCULAR | 3 refills | Status: DC

## 2023-01-05 NOTE — Telephone Encounter (Signed)
Botox prescription/enrollment portion completed and pending Dr Cathren Laine signature along with paper prescription. Fax number for Dina Rich is [phone]

## 2023-01-06 ENCOUNTER — Telehealth: Payer: Self-pay | Admitting: Adult Health

## 2023-01-06 NOTE — Telephone Encounter (Signed)
Chronic Migraine CPT 64615  Botox J0585 Units:200  G43.711 Chronic Migraine without aura, intractable, with status migrainous  BCBS card scanned into chart. Patient doesn't think insurance will approve Botox this year so she is applying for the patient savings program w/ [employer]. We should still attempt PA in case the savings program needs proof of denial.   Please confirm BB or SP if approved, thanks!

## 2023-01-06 NOTE — Telephone Encounter (Signed)
Pt scheduled for botox injection for 02/10/23 and will need a new PA before appointment. Current PA expires 01/19/23

## 2023-01-12 NOTE — Telephone Encounter (Signed)
Received a call from [facility]. She provided pt's case number: [id number]. She stated the program requires a denial letter from insurance stating Botox is not covered. If there is an option to appeal we must do that. If the botox is still denied they need both letters of denial. If there is no option to appeal they only need the one denial letter. Fax number for documentation is [phone]. If botox ends up being approved, patient would qualify for the regular botox savings program since she has Pharmacist, community.

## 2023-01-13 ENCOUNTER — Other Ambulatory Visit: Payer: Self-pay | Admitting: Adult Health

## 2023-01-13 ENCOUNTER — Other Ambulatory Visit: Payer: Self-pay | Admitting: Neurology

## 2023-01-13 DIAGNOSIS — G43009 Migraine without aura, not intractable, without status migrainosus: Secondary | ICD-10-CM

## 2023-01-18 DIAGNOSIS — F411 Generalized anxiety disorder: Secondary | ICD-10-CM | POA: Diagnosis not present

## 2023-01-19 NOTE — Telephone Encounter (Signed)
Checking on auth, she is scheduled 02/10/23. Thank you!

## 2023-01-26 ENCOUNTER — Other Ambulatory Visit (HOSPITAL_COMMUNITY): Payer: Self-pay

## 2023-01-26 NOTE — Telephone Encounter (Signed)
Any updates on auth? Pt scheduled for 02/10/23

## 2023-01-26 NOTE — Telephone Encounter (Signed)
Pharmacy Patient Advocate Encounter   Received notification from [facility] that prior authorization for Botox 200 units is required/requested.  PA submitted on 01/26/2023 to (ins) [employer] via CoverMyMeds Key [id number] Status is pending

## 2023-01-27 ENCOUNTER — Encounter: Payer: Self-pay | Admitting: Physician Assistant

## 2023-01-27 ENCOUNTER — Other Ambulatory Visit (HOSPITAL_COMMUNITY): Payer: Self-pay

## 2023-01-27 NOTE — Telephone Encounter (Signed)
  BOTOX ONE-Benefits Verification Submitted-[id number]

## 2023-01-28 ENCOUNTER — Encounter: Payer: Self-pay | Admitting: Physician Assistant

## 2023-01-28 ENCOUNTER — Ambulatory Visit (INDEPENDENT_AMBULATORY_CARE_PROVIDER_SITE_OTHER): Payer: BC Managed Care – PPO | Admitting: Physician Assistant

## 2023-01-28 VITALS — BP 110/76 | HR 72 | Temp 97.8°F | Ht 67.5 in | Wt 193.0 lb

## 2023-01-28 DIAGNOSIS — M25552 Pain in left hip: Secondary | ICD-10-CM | POA: Diagnosis not present

## 2023-01-28 NOTE — Patient Instructions (Signed)
A referral has been placed for you to see one of our fantastic providers at [place]. Someone from their office will be in touch soon regarding scheduling your appointment.  Their location:  [place] [facility]  [address] on the 1st floor Phone number [phone] Fax [phone].   This location is across the street from the entrance to [employer] and in the same complex as the [facility]

## 2023-01-28 NOTE — Progress Notes (Signed)
Nancy [name] is a 42 y.o. female here for a new problem.  History of Present Illness:   Chief Complaint  Patient presents with   Hip Pain    Pt c/o left hip pain x 4 months, trouble with moving hip side to side, bending, stretching. Has not tried any medication.    HPI   Hip Pain Patient is complaining of localized non-reproducible left hip pain starting 4 months ago stating that she did nothing to aggravate area. She expresses that her ROM have worsened and has difficulty bending, stretching, and moving hips from side to side. She reports that she walks 30+ minutes on a stepper machine or 1.25 mile walk. Patient experiences no pain when walking straight. She denies hx of hip pain. She has not managed symptoms with any OTC products.     Past Medical History:  Diagnosis Date   Allergy    COVID    Eczema    Fatigue    Hashimoto's disease    Hypothyroidism, acquired, autoimmune    Migraines    Thyroiditis, autoimmune    Hashimotos    Vaginal delivery 2006, 2009     Social History   Tobacco Use   Smoking status: Never   Smokeless tobacco: Never  Vaping Use   Vaping Use: Never used  Substance Use Topics   Alcohol use: No   Drug use: No    Past Surgical History:  Procedure Laterality Date   MOLE REMOVAL  12/13/2008   tongue growth  12/13/2008   WISDOM TOOTH EXTRACTION  12/13/1997    Family History  Problem Relation Age of Onset   Stroke Mother    Lung cancer Mother        Small cell   COPD Mother    Diabetes Mellitus II Mother    Anxiety disorder Mother    Cancer Mother    Benign prostatic hyperplasia Father    Stroke Maternal Grandmother    Arthritis Maternal Grandmother    Varicose Veins Maternal Grandmother    Heart disease Maternal Grandmother    Atrial fibrillation Maternal Grandmother    Heart disease Maternal Grandfather    Hashimoto's thyroiditis Daughter        Hashimoto's 2   Diabetes Son    Hypothyroidism Son    Breast cancer  Other    Colon cancer Other    Atrial fibrillation Maternal Uncle    Migraines Neg Hx     Allergies  Allergen Reactions   Codeine Nausea Only    Current Medications:   Current Outpatient Medications:    botulinum toxin Type A (BOTOX) 200 units injection, PROVIDER TO INJECT 155 UNITS INTO HEAD AND NECK MUSCLES EVERY 12 WEEKS.  DISCARD REMAINDER., Disp: 1 each, Rfl: 3   Calcium-Magnesium-Vitamin D (CITRACAL CALCIUM+D) 600-40-500 MG-MG-UNIT TB24, Take 1 tablet at dinner and 1 tablet at bedtime., Disp: 90 tablet, Rfl: 1   cetirizine (ZYRTEC) 10 MG tablet, Take 10 mg by mouth daily as needed., Disp: , Rfl:    EPINEPHrine 0.3 mg/0.3 mL IJ SOAJ injection, Inject 0.3 mg into the muscle as needed for anaphylaxis., Disp: 2 each, Rfl: 1   estradiol (CLIMARA) 0.05 mg/24hr patch, Place 1 patch (0.05 mg total) onto the skin once a week., Disp: 12 patch, Rfl: 3   levothyroxine (SYNTHROID) 112 MCG tablet, TAKE 1 1/2 TABLETS BY MOUTH DAILY, Disp: 180 tablet, Rfl: 3   Magnesium 500 MG TABS, Take 1 tablet by mouth daily in the afternoon., Disp: ,  Rfl:    naproxen (NAPROSYN) 500 MG tablet, Take 500 mg by mouth as needed., Disp: , Rfl:    ondansetron (ZOFRAN-ODT) 4 MG disintegrating tablet, DISSOLVE 1 TABLET(4 MG) ON THE TONGUE EVERY 8 HOURS AS NEEDED FOR NAUSEA OR VOMITING, Disp: 30 tablet, Rfl: 0   progesterone (PROMETRIUM) 100 MG capsule, Take 1 capsule (100 mg total) by mouth daily., Disp: 90 capsule, Rfl: 3   propranolol (INDERAL) 10 MG tablet, TAKE 1 TABLET(10 MG) BY MOUTH EVERY 8 HOURS AS NEEDED, Disp: 270 tablet, Rfl: 3   SUMAtriptan (IMITREX) 100 MG tablet, TAKE 1 TABLET(100 MG) BY MOUTH 1 TIME. MAY REPEAT IN 2 HOURS IF HEADACHE PERSISTS OR RECURS, Disp: 10 tablet, Rfl: 5   Review of Systems:   Review of Systems  Musculoskeletal:  Positive for joint pain (left hip).    Vitals:   Vitals:   01/28/23 1517  BP: 110/76  Pulse: 72  Temp: 97.8 F (36.6 C)  TempSrc: Temporal  SpO2: 99%   Weight: 193 lb (87.5 kg)  Height: 5' 7.5" (1.715 m)     Body mass index is 29.78 kg/m.  Physical Exam:   Physical Exam Constitutional:      Appearance: Normal appearance. She is well-developed.  HENT:     Head: Normocephalic and atraumatic.  Eyes:     General: Lids are normal.     Extraocular Movements: Extraocular movements intact.     Conjunctiva/sclera: Conjunctivae normal.  Pulmonary:     Effort: Pulmonary effort is normal.  Musculoskeletal:        General: Normal range of motion.     Cervical back: Normal range of motion and neck supple.     Comments: Left hip with normal flexion and extension Slight pain elicited with internal and external rotation  Skin:    General: Skin is warm and dry.  Neurological:     Mental Status: She is alert and oriented to person, place, and time.  Psychiatric:        Attention and Perception: Attention and perception normal.        Mood and Affect: Mood normal.        Behavior: Behavior normal.        Thought Content: Thought content normal.        Judgment: Judgment normal.     Assessment and Plan:   Left hip pain Suspect hip flexor strain Referral to sports medicine per patient request Handout provided  I,Verona Buck,acting as a scribe for [employer], PA.,have documented all relevant documentation on the behalf of Inda Coke, PA,as directed by  Inda Coke, PA while in the presence of Inda Coke, [place].  I, Inda Coke, [place], have reviewed all documentation for this visit. The documentation on 01/28/23 for the exam, diagnosis, procedures, and orders are all accurate and complete.   Inda Coke, PA-C

## 2023-01-31 ENCOUNTER — Other Ambulatory Visit (HOSPITAL_COMMUNITY): Payer: Self-pay

## 2023-02-01 ENCOUNTER — Ambulatory Visit: Payer: BC Managed Care – PPO | Admitting: Adult Health

## 2023-02-01 ENCOUNTER — Other Ambulatory Visit (HOSPITAL_COMMUNITY): Payer: Self-pay

## 2023-02-01 NOTE — Telephone Encounter (Signed)
Pharmacy Patient Advocate Encounter  Prior Authorization for Botox 200 units has been approved.    PA# Not available Effective dates: 01/26/2023 through 12/27/2023 This was processed thought the medical benefits This will be [employer] and [employer]

## 2023-02-01 NOTE — Telephone Encounter (Signed)
Checking for updates on auth

## 2023-02-07 ENCOUNTER — Encounter: Payer: Self-pay | Admitting: Adult Health

## 2023-02-09 ENCOUNTER — Ambulatory Visit (INDEPENDENT_AMBULATORY_CARE_PROVIDER_SITE_OTHER): Payer: BC Managed Care – PPO | Admitting: Sports Medicine

## 2023-02-09 ENCOUNTER — Ambulatory Visit (INDEPENDENT_AMBULATORY_CARE_PROVIDER_SITE_OTHER): Payer: BC Managed Care – PPO

## 2023-02-09 VITALS — BP 122/82 | HR 83 | Ht 67.0 in | Wt 193.0 lb

## 2023-02-09 DIAGNOSIS — M25552 Pain in left hip: Secondary | ICD-10-CM

## 2023-02-09 MED ORDER — MELOXICAM 15 MG PO TABS: 15.0000 mg | ORAL_TABLET | Freq: Every day | ORAL | 0 refills | Status: DC

## 2023-02-09 NOTE — Progress Notes (Unsigned)
02/09/23: 4 migraines a month. Continues Imitrex when she does have a headache.   11/08/22: Reports that Botox continues to work well for her.  Weather continues to be a trigger for her migraines.  Remains on Trokendi 150 mg daily and uses Imitrex for abortive therapy  08/05/2022: Botox continues to work well for her.  She reports that if there is a bad storm she typically will get a migraine otherwise she has been doing well  05/11/2022: botox continues to work well. Trigger for migraines is typically weather related   BOTOX PROCEDURE NOTE FOR MIGRAINE HEADACHE    Contraindications and precautions discussed with patient(above). Aseptic procedure was observed and patient tolerated procedure. Procedure performed by Ward Givens, NP  The condition has existed for more than 6 months, and pt does not have a diagnosis of ALS, Myasthenia Gravis or Lambert-Eaton Syndrome.  Risks and benefits of injections discussed and pt agrees to proceed with the procedure.  Written consent obtained  These injections are medically necessary. ]These injections do not cause sedations or hallucinations which the oral therapies may cause.  Indication/Diagnosis: chronic migraine BOTOX(J0585) injection was performed according to protocol by [employer]er[employer]. 200 units of BOTOX was dissolved into 4 cc NS.      Botox consent signed   Botox- 200 units x 1 vial Lot: [id number] Expiration: 05/2025 NDC: [id number]   Bacteriostatic 0.9% Sodium Chloride- 81m total Lot: [id number]: 11/25 NDC: [id number]   Description of procedure:  The patient was placed in a sitting position. The standard protocol was used for Botox as follows, with 5 units of Botox injected at each site:   -Procerus muscle, midline injection  -Corrugator muscle, bilateral injection  -Frontalis muscle, bilateral injection, with 2 sites each side, medial injection was performed in the upper one third of the frontalis muscle, in the  region vertical from the medial inferior edge of the superior orbital rim. The lateral injection was again in the upper one third of the forehead vertically above the lateral limbus of the cornea, 1.5 cm lateral to the medial injection site.  -Temporalis muscle injection, 4 sites, bilaterally. The first injection was 3 cm above the tragus of the ear, second injection site was 1.5 cm to 3 cm up from the first injection site in line with the tragus of the ear. The third injection site was 1.5-3 cm forward between the first 2 injection sites. The fourth injection site was 1.5 cm posterior to the second injection site.  -Occipitalis muscle injection, 3 sites, bilaterally. The first injection was done one half way between the occipital protuberance and the tip of the mastoid process behind the ear. The second injection site was done lateral and superior to the first, 1 fingerbreadth from the first injection. The third injection site was 1 fingerbreadth superiorly and medially from the first injection site.  -Cervical paraspinal muscle injection, 2 sites, bilateral knee first injection site was 1 cm from the midline of the cervical spine, 3 cm inferior to the lower border of the occipital protuberance. The second injection site was 1.5 cm superiorly and laterally to the first injection site.  -Trapezius muscle injection was performed at 3 sites, bilaterally. The first injection site was in the upper trapezius muscle halfway between the inflection point of the neck, and the acromion. The second injection site was one half way between the acromion and the first injection site. The third injection was done between the first injection site and the inflection point of the  neck.   Will return for repeat injection in 3 months.   A 200 units of Botox was used, 155 units were injected, the rest of the Botox was wasted. The patient tolerated the procedure well, there were no complications of the above  procedure.  Ward Givens, MSN, NP-C 02/09/2023, 4:05 PM [facility] [address] [place], [place] [address] [phone]

## 2023-02-09 NOTE — Progress Notes (Signed)
Nancy [name] Nancy [name] Phone: [phone]   Assessment and Plan:     1. Left hip pain -Chronic with exacerbation, initial sports medicine visit - Most consistent with intra-articular hip pathology based on HPI and physical exam with positive FABER and FADIR, though no significant findings on x-ray - X-ray obtained in clinic.  My interpretation: No acute fracture or dislocation.  Incidental finding of spur near left SI joint - Start meloxicam 15 mg daily x2 weeks.  If still having pain after 2 weeks, complete 3rd-week of meloxicam. May use remaining meloxicam as needed once daily for pain control.  Do not to use additional NSAIDs while taking meloxicam.  May use Tylenol [phone] mg 2 to 3 times a day for breakthrough pain. -Start HEP for hip - DG HIP UNILAT WITH PELVIS 2-3 VIEWS LEFT; Future  Other orders - meloxicam (MOBIC) 15 MG tablet; Take 1 tablet (15 mg total) by mouth daily.    Pertinent previous records reviewed include none   Follow Up: 3 to 4 weeks for reevaluation.  If no improvement or worsening of symptoms, would consider physical therapy versus intra-articular hip CSI   Subjective:   I, Nancy [name], am serving as a Education administrator for Doctor Nancy [name]  Chief Complaint: left hip pain   HPI:   02/09/23 Patient is a 42 year old female complaining of left hip pain. Patient states that the pain started sept/oct , she notes no MOI, She expresses that her ROM have worsened and has difficulty bending, stretching, and moving hips from side to side. She reports that she walks 30+ minutes on a stepper machine or 1.25 mile walk. Patient experiences no pain when walking straight. She denies hx of hip pain. She has not managed symptoms with any OTC products. is fine when she is walking in a straight plan but internal and external and painful , and pain with criss cross apple sauce, no meds, does get some  numbness after she has used it a lot , no radiating pain   Relevant Historical Information: Hypothyroidism,   Additional pertinent review of systems negative.   Current Outpatient Medications:    botulinum toxin Type A (BOTOX) 200 units injection, PROVIDER TO INJECT 155 UNITS INTO HEAD AND NECK MUSCLES EVERY 12 WEEKS.  DISCARD REMAINDER., Disp: 1 each, Rfl: 3   Calcium-Magnesium-Vitamin D (CITRACAL CALCIUM+D) 600-40-500 MG-MG-UNIT TB24, Take 1 tablet at dinner and 1 tablet at bedtime., Disp: 90 tablet, Rfl: 1   cetirizine (ZYRTEC) 10 MG tablet, Take 10 mg by mouth daily as needed., Disp: , Rfl:    EPINEPHrine 0.3 mg/0.3 mL IJ SOAJ injection, Inject 0.3 mg into the muscle as needed for anaphylaxis., Disp: 2 each, Rfl: 1   estradiol (CLIMARA) 0.05 mg/24hr patch, Place 1 patch (0.05 mg total) onto the skin once a week., Disp: 12 patch, Rfl: 3   levothyroxine (SYNTHROID) 112 MCG tablet, TAKE 1 1/2 TABLETS BY MOUTH DAILY, Disp: 180 tablet, Rfl: 3   Magnesium 500 MG TABS, Take 1 tablet by mouth daily in the afternoon., Disp: , Rfl:    meloxicam (MOBIC) 15 MG tablet, Take 1 tablet (15 mg total) by mouth daily., Disp: 30 tablet, Rfl: 0   naproxen (NAPROSYN) 500 MG tablet, Take 500 mg by mouth as needed., Disp: , Rfl:    ondansetron (ZOFRAN-ODT) 4 MG disintegrating tablet, DISSOLVE 1 TABLET(4 MG) ON THE TONGUE EVERY 8 HOURS AS NEEDED FOR NAUSEA OR  VOMITING, Disp: 30 tablet, Rfl: 0   progesterone (PROMETRIUM) 100 MG capsule, Take 1 capsule (100 mg total) by mouth daily., Disp: 90 capsule, Rfl: 3   propranolol (INDERAL) 10 MG tablet, TAKE 1 TABLET(10 MG) BY MOUTH EVERY 8 HOURS AS NEEDED, Disp: 270 tablet, Rfl: 3   SUMAtriptan (IMITREX) 100 MG tablet, TAKE 1 TABLET(100 MG) BY MOUTH 1 TIME. MAY REPEAT IN 2 HOURS IF HEADACHE PERSISTS OR RECURS, Disp: 10 tablet, Rfl: 5   Objective:     Vitals:   02/09/23 1455  BP: 122/82  Pulse: 83  SpO2: 99%  Weight: 193 lb (87.5 kg)  Height: '5\' 7"'$  (1.702 m)       Body mass index is 30.23 kg/m.    Physical Exam:    General: awake, alert, and oriented no acute distress, nontoxic Skin: no suspicious lesions or rashes Neuro:sensation intact distally with no deficits, normal muscle tone, no atrophy, strength 5/5 in all tested lower ext groups Psych: normal mood and affect, speech clear   Left hip: No deformity, swelling or wasting ROM Flexion 80 with painful arc, ext 30, IR 35 with painful arc, ER 45 with painful arc NTTP over the hip flexors, greater trochanter, gluteal musculature, si joint, lumbar spine Negative log roll with FROM Positive FABER for anterior groin pain Positive FADIR for anterior groin pain Negative Piriformis test Positive left trendelenberg Gait normal    Electronically signed by:  Nancy [name] D.[name] 3:17 PM 02/09/23

## 2023-02-09 NOTE — Patient Instructions (Addendum)
Good to see you  - Start meloxicam 15 mg daily x2 weeks.  If still having pain after 2 weeks, complete 3rd-week of meloxicam. May use remaining meloxicam as needed once daily for pain control.  Do not to use additional NSAIDs while taking meloxicam.  May use Tylenol [phone] mg 2 to 3 times a day for breakthrough pain. Hip HEP 3-4 week follow up

## 2023-02-10 ENCOUNTER — Ambulatory Visit (INDEPENDENT_AMBULATORY_CARE_PROVIDER_SITE_OTHER): Payer: BC Managed Care – PPO | Admitting: Adult Health

## 2023-02-10 DIAGNOSIS — G43711 Chronic migraine without aura, intractable, with status migrainosus: Secondary | ICD-10-CM

## 2023-02-10 MED ORDER — ONABOTULINUMTOXINA 200 UNITS IJ SOLR
155.0000 [IU] | Freq: Once | INTRAMUSCULAR | Status: AC
  Administered 2023-02-10: 155 [IU] via INTRAMUSCULAR

## 2023-02-10 NOTE — Progress Notes (Signed)
Botox consent signed  Botox- 200 units x 1 vial Lot: [id number] Expiration: 05/2025 NDC: [id number]  Bacteriostatic 0.9% Sodium Chloride- 50m total Lot: [id number]: 11/25 NDC: [id number] Dx: [id number] [facility]

## 2023-02-15 ENCOUNTER — Encounter: Payer: Self-pay | Admitting: Neurology

## 2023-02-15 DIAGNOSIS — F411 Generalized anxiety disorder: Secondary | ICD-10-CM | POA: Diagnosis not present

## 2023-02-16 NOTE — Addendum Note (Signed)
Addended by: Gildardo Griffes on: 02/16/2023 04:17 PM   Modules accepted: Orders

## 2023-02-17 MED ORDER — QULIPTA 30 MG PO TABS: 30.0000 mg | ORAL_TABLET | Freq: Every day | ORAL | 5 refills | Status: DC

## 2023-02-23 NOTE — Progress Notes (Signed)
Nancy [name] D.[place] Phone: [phone]   Assessment and Plan:     1. Left hip pain -Chronic with exacerbation, subsequent visit - Overall improvement with meloxicam 15 mg daily x 4 weeks, however patient still having intermittent left anterior groin pain in "C-shaped" around left hip - Still concerning for intra-articular pathology, however with patient generally improving, recommend continuing conservative therapy with home exercises and transitioning to Tylenol as needed - Discontinue meloxicam and use remainder as needed - Continue HEP    Pertinent previous records reviewed include none   Follow Up: As needed if no improvement or worsening of symptoms.  Could consider advanced imaging versus intra-articular hip CSI versus physical therapy   Subjective:   I, Nancy [name], am serving as a Education administrator for Doctor Glennon Mac   Chief Complaint: left hip pain    HPI:    02/09/23 Patient is a 42 year old female complaining of left hip pain. Patient states that the pain started sept/oct , she notes no MOI, She expresses that her ROM have worsened and has difficulty bending, stretching, and moving hips from side to side. She reports that she walks 30+ minutes on a stepper machine or 1.25 mile walk. Patient experiences no pain when walking straight. She denies hx of hip pain. She has not managed symptoms with any OTC products. is fine when she is walking in a straight plan but internal and external and painful , and pain with criss cross apple sauce, no meds, does get some numbness after she has used it a lot , no radiating pain   03/02/2023 Patient states that she is doing better not a 100 but better ROM     Relevant Historical Information: Hypothyroidism,  Additional pertinent review of systems negative.   Current Outpatient Medications:    Atogepant (QULIPTA) 30 MG TABS, Take 1 tablet (30 mg  total) by mouth daily., Disp: 30 tablet, Rfl: 5   botulinum toxin Type A (BOTOX) 200 units injection, PROVIDER TO INJECT 155 UNITS INTO HEAD AND NECK MUSCLES EVERY 12 WEEKS.  DISCARD REMAINDER., Disp: 1 each, Rfl: 3   Calcium-Magnesium-Vitamin D (CITRACAL CALCIUM+D) 600-40-500 MG-MG-UNIT TB24, Take 1 tablet at dinner and 1 tablet at bedtime., Disp: 90 tablet, Rfl: 1   cetirizine (ZYRTEC) 10 MG tablet, Take 10 mg by mouth daily as needed., Disp: , Rfl:    EPINEPHrine 0.3 mg/0.3 mL IJ SOAJ injection, Inject 0.3 mg into the muscle as needed for anaphylaxis., Disp: 2 each, Rfl: 1   estradiol (CLIMARA) 0.05 mg/24hr patch, Place 1 patch (0.05 mg total) onto the skin once a week., Disp: 12 patch, Rfl: 3   levothyroxine (SYNTHROID) 112 MCG tablet, TAKE 1 1/2 TABLETS BY MOUTH DAILY, Disp: 180 tablet, Rfl: 3   Magnesium 500 MG TABS, Take 1 tablet by mouth daily in the afternoon., Disp: , Rfl:    meloxicam (MOBIC) 15 MG tablet, Take 1 tablet (15 mg total) by mouth daily., Disp: 30 tablet, Rfl: 0   naproxen (NAPROSYN) 500 MG tablet, Take 500 mg by mouth as needed., Disp: , Rfl:    ondansetron (ZOFRAN-ODT) 4 MG disintegrating tablet, DISSOLVE 1 TABLET(4 MG) ON THE TONGUE EVERY 8 HOURS AS NEEDED FOR NAUSEA OR VOMITING, Disp: 30 tablet, Rfl: 0   progesterone (PROMETRIUM) 100 MG capsule, Take 1 capsule (100 mg total) by mouth daily., Disp: 90 capsule, Rfl: 3   propranolol (INDERAL) 10 MG tablet, TAKE 1  TABLET(10 MG) BY MOUTH EVERY 8 HOURS AS NEEDED, Disp: 270 tablet, Rfl: 3   SUMAtriptan (IMITREX) 100 MG tablet, TAKE 1 TABLET(100 MG) BY MOUTH 1 TIME. MAY REPEAT IN 2 HOURS IF HEADACHE PERSISTS OR RECURS, Disp: 10 tablet, Rfl: 5   Objective:     Vitals:   03/02/23 1446  BP: 120/78  Pulse: (!) 103  SpO2: 98%  Weight: 197 lb (89.4 kg)  Height: 5\' 7"  (1.702 m)      Body mass index is 30.85 kg/m.    Physical Exam:    General: awake, alert, and oriented no acute distress, nontoxic Skin: no suspicious  lesions or rashes Neuro:sensation intact distally with no deficits, normal muscle tone, no atrophy, strength 5/5 in all tested lower ext groups Psych: normal mood and affect, speech clear   Left hip: No deformity, swelling or wasting ROM Flexion 90, ext 30, IR 45, ER 45 NTTP over the hip flexors, greater trochanter, gluteal musculature, si joint, lumbar spine Negative log roll with FROM Positive FABER Positive FADIR Negative Piriformis test Negative trendelenberg Gait normal    Electronically signed by:  Nancy [name] D.[facility] 4:32 PM 03/02/23

## 2023-02-24 ENCOUNTER — Telehealth: Payer: Self-pay | Admitting: *Deleted

## 2023-02-24 DIAGNOSIS — G43711 Chronic migraine without aura, intractable, with status migrainosus: Secondary | ICD-10-CM

## 2023-02-24 NOTE — Telephone Encounter (Signed)
Patient needs Qulipta 30 mg PA completed BUT if insurance is not going to allow both Qulipta and Botox then do not finish PA because they may cancel her Botox. We do not want to lose her Botox authorization over this right now but wanted to see if she can have Qulipta too.

## 2023-02-25 ENCOUNTER — Other Ambulatory Visit (HOSPITAL_COMMUNITY): Payer: Self-pay

## 2023-02-25 NOTE — Telephone Encounter (Signed)
    Pharmacy Patient Advocate Encounter   Received notification from [facility] that prior authorization for Qulipta 30 MG Tablets is required/requested.   PA submitted on 02/25/2023 to (ins) [employer] [place] via [employer] or [facility]) confirmation # [id number] Status is pending

## 2023-02-28 NOTE — Telephone Encounter (Signed)
Pharmacy Patient Advocate Encounter  Received notification from [facility] that the request for prior authorization for Lenoria Chime has been denied due to See below.       Please be advised we currently do not have a Pharmacist to review denials, therefore you will need to process appeals accordingly as needed. Thanks for your support at this time.   Denial letter has been scanned into chart under media tab. I tried to submit an Eappeal via [facility] but it was also denied immediately. The most recent OV that I sent in clearly states the PT still has 4 Migraines per month which should have met the criteria.

## 2023-03-01 NOTE — Telephone Encounter (Signed)
Pt scheduled for vv with Megan for 03/03/23 at 1:30pm

## 2023-03-02 ENCOUNTER — Ambulatory Visit (INDEPENDENT_AMBULATORY_CARE_PROVIDER_SITE_OTHER): Payer: BC Managed Care – PPO | Admitting: Sports Medicine

## 2023-03-02 VITALS — BP 120/78 | HR 103 | Ht 67.0 in | Wt 197.0 lb

## 2023-03-02 DIAGNOSIS — M25552 Pain in left hip: Secondary | ICD-10-CM

## 2023-03-02 NOTE — Patient Instructions (Signed)
Good to see you   

## 2023-03-03 ENCOUNTER — Telehealth (INDEPENDENT_AMBULATORY_CARE_PROVIDER_SITE_OTHER): Payer: BC Managed Care – PPO | Admitting: Adult Health

## 2023-03-03 ENCOUNTER — Encounter: Payer: Self-pay | Admitting: *Deleted

## 2023-03-03 DIAGNOSIS — G43711 Chronic migraine without aura, intractable, with status migrainosus: Secondary | ICD-10-CM

## 2023-03-03 NOTE — Progress Notes (Signed)
PATIENT: Nancy [name] DOB: 1981/09/21  REASON FOR VISIT: follow up HISTORY FROM: patient  Virtual Visit via Video Note  I connected with Nancy [name] on 03/03/23 at  1:30 PM EDT by a video enabled telemedicine application located remotely at [facility] and verified that I am speaking with the correct person using two identifiers who was located at their own home in [place]   I discussed the limitations of evaluation and management by telemedicine and the availability of in person appointments. The patient expressed understanding and agreed to proceed.   PATIENT: Nancy [name] DOB: Mar 08, 1981  REASON FOR VISIT: follow up HISTORY FROM: patient  HISTORY OF PRESENT ILLNESS: Today 03/03/23:  Nancy [name] is a 42 y.o. female with a history of MIgraine headaches. Returns today for follow-up.  Patient reports that while Botox her headaches have been good and half but she still having approximately 8 headache migraines a month that last approximately 2 days totaling 16 headache days a month.  Patient was able to pick up prescription of Qulipta with the co-pay card however insurance denied this.  She started [place] March 1.  States that she has not had any migraine headaches since starting the medication.  In the past she has been on propranolol due to palpitations she is unsure if it was helpful for her migraines.  She does report that she tolerated it well.  Returns today for an evaluation.    REVIEW OF SYSTEMS: Out of a complete 14 system review of symptoms, the patient complains only of the following symptoms, and all other reviewed systems are negative.  ALLERGIES: Allergies  Allergen Reactions   Codeine Nausea Only    HOME MEDICATIONS: Outpatient Medications Prior to Visit  Medication Sig Dispense Refill   Atogepant (QULIPTA) 30 MG TABS Take 1 tablet (30 mg total) by mouth daily. 30 tablet 5   botulinum  toxin Type A (BOTOX) 200 units injection PROVIDER TO INJECT 155 UNITS INTO HEAD AND NECK MUSCLES EVERY 12 WEEKS.  DISCARD REMAINDER. 1 each 3   Calcium-Magnesium-Vitamin D (CITRACAL CALCIUM+D) 600-40-500 MG-MG-UNIT TB24 Take 1 tablet at dinner and 1 tablet at bedtime. 90 tablet 1   cetirizine (ZYRTEC) 10 MG tablet Take 10 mg by mouth daily as needed.     EPINEPHrine 0.3 mg/0.3 mL IJ SOAJ injection Inject 0.3 mg into the muscle as needed for anaphylaxis. 2 each 1   estradiol (CLIMARA) 0.05 mg/24hr patch Place 1 patch (0.05 mg total) onto the skin once a week. 12 patch 3   levothyroxine (SYNTHROID) 112 MCG tablet TAKE 1 1/2 TABLETS BY MOUTH DAILY 180 tablet 3   Magnesium 500 MG TABS Take 1 tablet by mouth daily in the afternoon.     meloxicam (MOBIC) 15 MG tablet Take 1 tablet (15 mg total) by mouth daily. 30 tablet 0   naproxen (NAPROSYN) 500 MG tablet Take 500 mg by mouth as needed.     ondansetron (ZOFRAN-ODT) 4 MG disintegrating tablet DISSOLVE 1 TABLET(4 MG) ON THE TONGUE EVERY 8 HOURS AS NEEDED FOR NAUSEA OR VOMITING 30 tablet 0   progesterone (PROMETRIUM) 100 MG capsule Take 1 capsule (100 mg total) by mouth daily. 90 capsule 3   propranolol (INDERAL) 10 MG tablet TAKE 1 TABLET(10 MG) BY MOUTH EVERY 8 HOURS AS NEEDED 270 tablet 3   SUMAtriptan (IMITREX) 100 MG tablet TAKE 1 TABLET(100 MG) BY MOUTH 1 TIME. MAY REPEAT  IN 2 HOURS IF HEADACHE PERSISTS OR RECURS 10 tablet 5   No facility-administered medications prior to visit.    PAST MEDICAL HISTORY: Past Medical History:  Diagnosis Date   Allergy    COVID    Eczema    Fatigue    Hashimoto's disease    Hypothyroidism, acquired, autoimmune    Migraines    Thyroiditis, autoimmune    Hashimotos    Vaginal delivery 2006, 2009    PAST SURGICAL HISTORY: Past Surgical History:  Procedure Laterality Date   MOLE REMOVAL  12/13/2008   tongue growth  12/13/2008   WISDOM TOOTH EXTRACTION  12/13/1997    FAMILY HISTORY: Family History   Problem Relation Age of Onset   Stroke Mother    Lung cancer Mother        Small cell   COPD Mother    Diabetes Mellitus II Mother    Anxiety disorder Mother    Cancer Mother    Benign prostatic hyperplasia Father    Stroke Maternal Grandmother    Arthritis Maternal Grandmother    Varicose Veins Maternal Grandmother    Heart disease Maternal Grandmother    Atrial fibrillation Maternal Grandmother    Heart disease Maternal Grandfather    Hashimoto's thyroiditis Daughter        Hashimoto's 2   Diabetes Son    Hypothyroidism Son    Breast cancer Other    Colon cancer Other    Atrial fibrillation Maternal Uncle    Migraines Neg Hx     SOCIAL HISTORY: Social History   Socioeconomic History   Marital status: Married    Spouse name: Chiropractor   Number of children: 2   Years of education: 16   Highest education level: Not on file  Occupational History   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  Tobacco Use   Smoking status: Never   Smokeless tobacco: Never  Vaping Use   Vaping Use: Never used  Substance and Sexual Activity   Alcohol use: No   Drug use: No   Sexual activity: Yes    Birth control/protection: Post-menopausal, Other-see comments, Inserts    Comment: I on on the nuvaring for the hormones  Other Topics Concern   Not on file  Social History Narrative   65 and 4 year old kids   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 Follows all commands speech and language fluent Cranial nerve II-XII: Facial  symmetry noted DIAGNOSTIC DATA (LABS, IMAGING, TESTING) - I reviewed patient records, labs, notes, testing and imaging myself where available.  Lab Results  Component Value Date   WBC 4.9 09/07/2022   HGB 13.2 09/07/2022   HCT 39.7 09/07/2022   MCV 87.8 09/07/2022   PLT 203 09/07/2022      Component Value Date/Time   NA 141 09/07/2022 1334   NA 144 01/08/2021 1557   K 3.9 09/07/2022 1334   CL 109 09/07/2022 1334   CO2 22 09/07/2022 1334   GLUCOSE 94 09/07/2022  1334   BUN 12 09/07/2022 1334   BUN 15 01/08/2021 1557   CREATININE 0.80 09/07/2022 1334   CREATININE 0.90 09/01/2020 1027   CALCIUM 9.7 09/07/2022 1334   PROT 7.3 02/03/2022 1620   ALBUMIN 4.3 02/01/2022 1429   ALBUMIN 4.6 01/08/2021 1557   AST 14 02/01/2022 1429   ALT 16 02/01/2022 1429   ALKPHOS 37 (L) 02/01/2022 1429   BILITOT 0.2 02/01/2022 1429   BILITOT <0.2 01/08/2021 1557   GFRNONAA >60 09/07/2022 1334   GFRAA 86 01/08/2021 1557   Lab Results  Component Value Date   CHOL 178 09/07/2021   HDL 65.20 09/07/2021   LDLCALC 87 09/07/2021   TRIG 130.0 09/07/2021   CHOLHDL 3 09/07/2021   Lab Results  Component Value Date   HGBA1C 5.4 02/03/2022   Lab Results  Component Value Date   VITAMINB12 928 02/03/2022   Lab Results  Component Value Date   TSH 1.21 08/30/2022      ASSESSMENT AND PLAN 42 y.o. year old female  has a past medical history of Allergy, COVID, Eczema, Fatigue, Hashimoto's disease, Hypothyroidism, acquired, autoimmune, Migraines, Thyroiditis, autoimmune, and Vaginal delivery (2006, 2009). here with:  Migraine headaches  - Continue Qulipta- we will write appeal - Continue Botox injections for now - If unable to get Qulipta approved- will try propranolol 10 mg BID - Keep appt in June for Botox   Ward Givens, MSN, NP-C 03/03/2023, 1:52 PM [facility] [address] [place], [place] [address] [phone]

## 2023-03-10 ENCOUNTER — Other Ambulatory Visit: Payer: Self-pay | Admitting: Sports Medicine

## 2023-03-14 ENCOUNTER — Ambulatory Visit: Payer: BC Managed Care – PPO | Admitting: Physician Assistant

## 2023-03-15 DIAGNOSIS — F411 Generalized anxiety disorder: Secondary | ICD-10-CM | POA: Diagnosis not present

## 2023-03-23 ENCOUNTER — Encounter: Payer: Self-pay | Admitting: Physician Assistant

## 2023-03-28 NOTE — Telephone Encounter (Signed)
Appeal was faxed on or around 03/07/23 and typically it takes 30 days to hear back from insurance.

## 2023-03-29 MED ORDER — QULIPTA 60 MG PO TABS: 30.0000 mg | ORAL_TABLET | Freq: Every day | ORAL | 0 refills | Status: DC

## 2023-03-29 NOTE — Telephone Encounter (Signed)
Order placed for sample Qulipta. Medication ready for pick up.

## 2023-03-29 NOTE — Addendum Note (Signed)
Addended by: Lenn Cal on: 03/29/2023 10:47 AM   Modules accepted: Orders

## 2023-03-29 NOTE — Telephone Encounter (Signed)
Spoke with Aundra Millet NP. We only have Qulipta 60 mg samples. Will give patient 8 tablets and she can cut them in half and take 30 mg daily.

## 2023-04-12 ENCOUNTER — Other Ambulatory Visit (HOSPITAL_COMMUNITY): Payer: Self-pay

## 2023-04-12 DIAGNOSIS — F411 Generalized anxiety disorder: Secondary | ICD-10-CM | POA: Diagnosis not present

## 2023-04-14 MED ORDER — QULIPTA 60 MG PO TABS: 30.0000 mg | ORAL_TABLET | Freq: Every day | ORAL | 0 refills | Status: DC

## 2023-04-14 NOTE — Telephone Encounter (Signed)
I called [facility] department and checked status of appeal that we faxed on 03/07/23 (and have receipt). I was told they do not have an appeal on file. I faxed the appeal requesting urgent review and Megan NP gave v.o. to provide a few more Qulipta samples to patient until we wait to hear if insurance will cover. Qulipta samples ordered and are ready for patient pickup.

## 2023-04-14 NOTE — Addendum Note (Signed)
Addended by: Bertram Savin on: 04/14/2023 03:50 PM   Modules accepted: Orders

## 2023-04-20 DIAGNOSIS — Z1389 Encounter for screening for other disorder: Secondary | ICD-10-CM | POA: Diagnosis not present

## 2023-04-20 DIAGNOSIS — G43909 Migraine, unspecified, not intractable, without status migrainosus: Secondary | ICD-10-CM | POA: Diagnosis not present

## 2023-04-20 DIAGNOSIS — E039 Hypothyroidism, unspecified: Secondary | ICD-10-CM | POA: Diagnosis not present

## 2023-04-20 DIAGNOSIS — E8889 Other specified metabolic disorders: Secondary | ICD-10-CM | POA: Diagnosis not present

## 2023-05-03 NOTE — Telephone Encounter (Signed)
Received letter from [facility] stating appeal has been received. [employer] says request does not qualify for expedited appeal but they will proceed with standard appeal with determination expected up to 30 days from receipt. Appeal ID # [id number]. Date of letter is Apr 25, 2023.

## 2023-05-05 DIAGNOSIS — E039 Hypothyroidism, unspecified: Secondary | ICD-10-CM | POA: Diagnosis not present

## 2023-05-05 DIAGNOSIS — E669 Obesity, unspecified: Secondary | ICD-10-CM | POA: Diagnosis not present

## 2023-05-05 DIAGNOSIS — Z9189 Other specified personal risk factors, not elsewhere classified: Secondary | ICD-10-CM | POA: Diagnosis not present

## 2023-05-05 DIAGNOSIS — G43909 Migraine, unspecified, not intractable, without status migrainosus: Secondary | ICD-10-CM | POA: Diagnosis not present

## 2023-05-10 DIAGNOSIS — F411 Generalized anxiety disorder: Secondary | ICD-10-CM | POA: Diagnosis not present

## 2023-05-11 ENCOUNTER — Other Ambulatory Visit (HOSPITAL_COMMUNITY): Payer: Self-pay

## 2023-05-11 MED ORDER — QULIPTA 60 MG PO TABS: 30.0000 mg | ORAL_TABLET | Freq: Every day | ORAL | 0 refills | Status: DC

## 2023-05-11 NOTE — Telephone Encounter (Signed)
Qulipta sample order placed. No determination received on our appeal quite yet. Samples are ready for pickup.

## 2023-05-11 NOTE — Addendum Note (Signed)
Addended by: Bertram Savin on: 05/11/2023 07:47 AM   Modules accepted: Orders

## 2023-05-18 DIAGNOSIS — G43909 Migraine, unspecified, not intractable, without status migrainosus: Secondary | ICD-10-CM | POA: Diagnosis not present

## 2023-05-18 DIAGNOSIS — E039 Hypothyroidism, unspecified: Secondary | ICD-10-CM | POA: Diagnosis not present

## 2023-05-18 DIAGNOSIS — E663 Overweight: Secondary | ICD-10-CM | POA: Diagnosis not present

## 2023-05-20 ENCOUNTER — Encounter: Payer: Self-pay | Admitting: Physician Assistant

## 2023-05-23 ENCOUNTER — Encounter: Payer: Self-pay | Admitting: Physician Assistant

## 2023-05-23 ENCOUNTER — Ambulatory Visit (INDEPENDENT_AMBULATORY_CARE_PROVIDER_SITE_OTHER): Payer: BC Managed Care – PPO | Admitting: Physician Assistant

## 2023-05-23 ENCOUNTER — Ambulatory Visit (INDEPENDENT_AMBULATORY_CARE_PROVIDER_SITE_OTHER): Payer: BC Managed Care – PPO | Admitting: Adult Health

## 2023-05-23 VITALS — BP 110/76 | HR 74 | Temp 98.0°F | Ht 67.0 in | Wt 195.0 lb

## 2023-05-23 DIAGNOSIS — G43711 Chronic migraine without aura, intractable, with status migrainosus: Secondary | ICD-10-CM | POA: Diagnosis not present

## 2023-05-23 DIAGNOSIS — Z0184 Encounter for antibody response examination: Secondary | ICD-10-CM

## 2023-05-23 DIAGNOSIS — Z111 Encounter for screening for respiratory tuberculosis: Secondary | ICD-10-CM

## 2023-05-23 MED ORDER — QULIPTA 60 MG PO TABS: 30.0000 mg | ORAL_TABLET | Freq: Every day | ORAL | 0 refills | Status: DC

## 2023-05-23 MED ORDER — ONABOTULINUMTOXINA 200 UNITS IJ SOLR
155.0000 [IU] | Freq: Once | INTRAMUSCULAR | Status: AC
  Administered 2023-05-23: 155 [IU] via INTRAMUSCULAR

## 2023-05-23 NOTE — Progress Notes (Signed)
Botox- 200 units x 1 vial Lot: [id number] Expiration: 072026 NDC: 0023-3921-02  Bacteriostatic 0.9% Sodium Chloride- 4 mL  Lot: [id number] Expiration: 03/13/2024 NDC: 0409-1966-02  Dx: G43.711 B/B Witnessed by S. Young RN  

## 2023-05-23 NOTE — Patient Instructions (Signed)
It was great to see you!  We will be in touch with the results and recommendations for vaccine updates   Take care,  Jarold Motto PA-C

## 2023-05-23 NOTE — Progress Notes (Signed)
Nancy [name] is a 42 y.o. female here for a new problem.  History of Present Illness:   Chief Complaint  Patient presents with   Immunizations    Pt here to discuss what immunizations and labs are needed for school in the Fall.     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 She is also requesting a TB test. Unfortunately she does not have records of her vaccinations from childhood.   Past Medical History:  Diagnosis Date   Allergy    COVID    Eczema    Fatigue    Hashimoto's disease    Hypothyroidism, acquired, autoimmune    Migraines    Thyroiditis, autoimmune    Hashimotos    Vaginal delivery 2006, 2009     Social History   Tobacco Use   Smoking status: Never   Smokeless tobacco: Never  Vaping Use   Vaping Use: Never used  Substance Use Topics   Alcohol use: No   Drug use: No    Past Surgical History:  Procedure Laterality Date   MOLE REMOVAL  12/13/2008   tongue growth  12/13/2008   WISDOM TOOTH EXTRACTION  12/13/1997    Family History  Problem Relation Age of Onset   Stroke Mother    Lung cancer Mother        Small cell   COPD Mother    Diabetes Mellitus II Mother    Anxiety disorder Mother    Cancer Mother    Benign prostatic hyperplasia Father    Stroke Maternal Grandmother    Arthritis Maternal Grandmother    Varicose Veins Maternal Grandmother    Heart disease Maternal Grandmother    Atrial fibrillation Maternal Grandmother    Heart disease Maternal Grandfather    Hashimoto's thyroiditis Daughter        Hashimoto's 2   Diabetes Son    Hypothyroidism Son    Breast cancer Other    Colon cancer Other    Atrial fibrillation Maternal Uncle    Migraines Neg Hx     Allergies  Allergen Reactions   Codeine Nausea Only    Current Medications:   Current Outpatient Medications:    Atogepant (QULIPTA) 30 MG TABS, Take 1 tablet (30 mg total) by  mouth daily., Disp: 30 tablet, Rfl: 5   Atogepant (QULIPTA) 60 MG TABS, Take 0.5 tablets (30 mg total) by mouth daily., Disp: 12 tablet, Rfl: 0   botulinum toxin Type A (BOTOX) 200 units injection, PROVIDER TO INJECT 155 UNITS INTO HEAD AND NECK MUSCLES EVERY 12 WEEKS.  DISCARD REMAINDER., Disp: 1 each, Rfl: 3   Calcium-Magnesium-Vitamin D (CITRACAL CALCIUM+D) 600-40-500 MG-MG-UNIT TB24, Take 1 tablet at dinner and 1 tablet at bedtime., Disp: 90 tablet, Rfl: 1   cetirizine (ZYRTEC) 10 MG tablet, Take 10 mg by mouth daily as needed., Disp: , Rfl:    EPINEPHrine 0.3 mg/0.3 mL IJ SOAJ injection, Inject 0.3 mg into the muscle as needed for anaphylaxis., Disp: 2 each, Rfl: 1   estradiol (CLIMARA) 0.05 mg/24hr patch, Place 1 patch (0.05 mg total) onto the skin once a week., Disp: 12 patch, Rfl: 3   levothyroxine (SYNTHROID) 112 MCG tablet, TAKE 1 1/2 TABLETS BY MOUTH DAILY, Disp: 180 tablet, Rfl: 3   LOMAIRA 8 MG TABS, Take 16 mg by mouth daily in the afternoon., Disp: , Rfl:    Magnesium 500 MG TABS, Take  1 tablet by mouth daily in the afternoon., Disp: , Rfl:    naproxen (NAPROSYN) 500 MG tablet, Take 500 mg by mouth as needed., Disp: , Rfl:    ondansetron (ZOFRAN-ODT) 4 MG disintegrating tablet, DISSOLVE 1 TABLET(4 MG) ON THE TONGUE EVERY 8 HOURS AS NEEDED FOR NAUSEA OR VOMITING, Disp: 30 tablet, Rfl: 0   progesterone (PROMETRIUM) 100 MG capsule, Take 1 capsule (100 mg total) by mouth daily., Disp: 90 capsule, Rfl: 3   propranolol (INDERAL) 10 MG tablet, TAKE 1 TABLET(10 MG) BY MOUTH EVERY 8 HOURS AS NEEDED, Disp: 270 tablet, Rfl: 3   SUMAtriptan (IMITREX) 100 MG tablet, TAKE 1 TABLET(100 MG) BY MOUTH 1 TIME. MAY REPEAT IN 2 HOURS IF HEADACHE PERSISTS OR RECURS, Disp: 10 tablet, Rfl: 5   Review of Systems:   ROS Negative unless otherwise specified per HPI.  Vitals:   Vitals:   05/23/23 1029  BP: 110/76  Pulse: 74  Temp: 98 F (36.7 C)  TempSrc: Temporal  SpO2: 99%  Weight: 195 lb (88.5  kg)  Height: 5\' 7"  (1.702 m)     Body mass index is 30.54 kg/m.  Physical Exam:   Physical Exam Constitutional:      Appearance: Normal appearance. She is well-developed.  HENT:     Head: Normocephalic and atraumatic.  Eyes:     General: Lids are normal.     Extraocular Movements: Extraocular movements intact.     Conjunctiva/sclera: Conjunctivae normal.  Pulmonary:     Effort: Pulmonary effort is normal.  Musculoskeletal:        General: Normal range of motion.     Cervical back: Normal range of motion and neck supple.  Skin:    General: Skin is warm and dry.  Neurological:     Mental Status: She is alert and oriented to person, place, and time.  Psychiatric:        Attention and Perception: Attention and perception normal.        Mood and Affect: Mood normal.        Behavior: Behavior normal.        Thought Content: Thought content normal.        Judgment: Judgment normal.     Assessment and Plan:   Immunity status testing Will order appropriate testing Based on my readings, there are no acceptable serologic titers that can be used as evidence of protection against meningococcal A, C, W, and Y disease.  Will provide recommendations based on results  Screening for tuberculosis TB test ordered via blood work today   [employer] as a Neurosurgeon for [employer], PA.,have documented all relevant documentation on the behalf of Jarold Motto, PA,as directed by  Jarold Motto, PA while in the presence of Jarold Motto, [place].   I, Jarold Motto, [place], have reviewed all documentation for this visit. The documentation on 05/23/23 for the exam, diagnosis, procedures, and orders are all accurate and complete.   Jarold Motto, PA-C

## 2023-05-23 NOTE — Progress Notes (Signed)
05/23/23: Patient reports that when she is able to take Qulipta in the Botox she does not have any migraines.  She states in the last 2 months she has not had any migraines.  She states typically weather would be a trigger for her.  We have had several thunderstorm that she did not develop a migraine.  Continues to use Imitrex for abortive therapy  02/09/23: 4 migraines a month. Continues Imitrex when she does have a headache.  11/08/22: Reports that Botox continues to work well for her.  Weather continues to be a trigger for her migraines.  Remains on Trokendi 150 mg daily and uses Imitrex for abortive therapy  08/05/2022: Botox continues to work well for her.  She reports that if there is a bad storm she typically will get a migraine otherwise she has been doing well  05/11/2022: botox continues to work well. Trigger for migraines is typically weather related   BOTOX PROCEDURE NOTE FOR MIGRAINE HEADACHE    Contraindications and precautions discussed with patient(above). Aseptic procedure was observed and patient tolerated procedure. Procedure performed by Butch Penny, NP  The condition has existed for more than 6 months, and pt does not have a diagnosis of ALS, Myasthenia Gravis or Lambert-Eaton Syndrome.  Risks and benefits of injections discussed and pt agrees to proceed with the procedure.  Written consent obtained  These injections are medically necessary. ]These injections do not cause sedations or hallucinations which the oral therapies may cause.  Indication/Diagnosis: chronic migraine BOTOX(J0585) injection was performed according to protocol by [employer]. 200 units of BOTOX was dissolved into 4 cc NS.      Botox- 200 units x 1 vial Lot: [id number] Expiration: 06/2025 NDC: 4782-9562-13   Bacteriostatic 0.9% Sodium Chloride- 4 mL  Lot: [id number] Expiration: 03/13/2024 NDC: 4696-2952-84   Dx: X32.440    Description of procedure:  The patient was placed in a sitting  position. The standard protocol was used for Botox as follows, with 5 units of Botox injected at each site:   -Procerus muscle, midline injection  -Corrugator muscle, bilateral injection  -Frontalis muscle, bilateral injection, with 2 sites each side, medial injection was performed in the upper one third of the frontalis muscle, in the region vertical from the medial inferior edge of the superior orbital rim. The lateral injection was again in the upper one third of the forehead vertically above the lateral limbus of the cornea, 1.5 cm lateral to the medial injection site.  -Temporalis muscle injection, 4 sites, bilaterally. The first injection was 3 cm above the tragus of the ear, second injection site was 1.5 cm to 3 cm up from the first injection site in line with the tragus of the ear. The third injection site was 1.5-3 cm forward between the first 2 injection sites. The fourth injection site was 1.5 cm posterior to the second injection site.  -Occipitalis muscle injection, 3 sites, bilaterally. The first injection was done one half way between the occipital protuberance and the tip of the mastoid process behind the ear. The second injection site was done lateral and superior to the first, 1 fingerbreadth from the first injection. The third injection site was 1 fingerbreadth superiorly and medially from the first injection site.  -Cervical paraspinal muscle injection, 2 sites, bilateral knee first injection site was 1 cm from the midline of the cervical spine, 3 cm inferior to the lower border of the occipital protuberance. The second injection site was 1.5 cm superiorly and laterally to  the first injection site.  -Trapezius muscle injection was performed at 3 sites, bilaterally. The first injection site was in the upper trapezius muscle halfway between the inflection point of the neck, and the acromion. The second injection site was one half way between the acromion and the first injection site.  The third injection was done between the first injection site and the inflection point of the neck.   Will return for repeat injection in 3 months.   A 200 units of Botox was used, 155 units were injected, the rest of the Botox was wasted. The patient tolerated the procedure well, there were no complications of the above procedure.  Butch Penny, MSN, NP-C 05/23/2023, 3:13 PM [facility] [address] [place], [place] [address] [phone]

## 2023-05-24 NOTE — Telephone Encounter (Signed)
Make patient aware that this was denied.  They are wanting her to try Aimovig.

## 2023-05-24 NOTE — Addendum Note (Signed)
Addended by: Bertram Savin on: 05/24/2023 04:46 PM   Modules accepted: Orders

## 2023-05-24 NOTE — Telephone Encounter (Signed)
Spoke with Aundra Millet NP. Try Aimovig instead and see if it can be approved.

## 2023-05-24 NOTE — Telephone Encounter (Signed)
Received a fax from [employer] stating Bennie Pierini denial has been upheld. They had a nurse review, plan medical director, and an MD board certified in Neurology.  The letter states "based on an additional review by a medical doctor expert board certified in neurology, the request for coverage of Bennie Pierini is not approved. There is insufficient documentation to prove that the member has more than 4 migraines per month.  The member has tried [employer], retail.  There is no documentation, however, of a trial of formulary drug Aimovig.  The policy coverage criteria for Bennie Pierini are, therefore, not met.  As such, [employer] and [employer] policy name: CGRP therapy essential April 2024, and [employer] [place] [employer] [place] utilization management policy name: Non-formulary exception criteria- essential formulary August 2022 has not been met.  Therefore request for coverage of Qulipta 30 mg tablet for this diagnosis of G43.711 chronic migraine without aura, intractable, with status migrainosus is not considered medically necessary.  Coverage criteria have not been met and the request for coverage is denied".   The patient can request an external review. An authorization form can be found at [email or web address] The NDCOI will determine that patient meets eligibility for external review.  [email or web address]

## 2023-05-25 MED ORDER — AIMOVIG 140 MG/ML ~~LOC~~ SOAJ: 140.0000 mg | SUBCUTANEOUS | 5 refills | Status: DC

## 2023-05-25 NOTE — Addendum Note (Signed)
Addended by: Enedina Finner on: 05/25/2023 04:21 PM   Modules accepted: Orders

## 2023-05-27 LAB — HEPATITIS B SURFACE ANTIGEN: Hepatitis B Surface Ag: NONREACTIVE

## 2023-05-27 LAB — QUANTIFERON-TB GOLD PLUS
Mitogen-NIL: 9.62 IU/mL
NIL: 0.01 IU/mL
QuantiFERON-TB Gold Plus: NEGATIVE
TB1-NIL: 0 IU/mL
TB2-NIL: 0.01 IU/mL

## 2023-05-27 LAB — MEASLES/MUMPS/RUBELLA IMMUNITY
Mumps IgG: 222 AU/mL
Rubella: 10.5 Index
Rubeola IgG: >300 AU/mL

## 2023-05-27 LAB — POLIOVIRUS (1,3) ABS, NEUTRALIZ.
Polio 1 Titer: 1:128 {titer}
Polio 3 Titer: >1:128 {titer}

## 2023-05-27 LAB — HEPATITIS B SURFACE ANTIBODY, QUANTITATIVE: Hep B S AB Quant (Post): 28 m[IU]/mL (ref >=10)

## 2023-05-30 ENCOUNTER — Telehealth: Payer: Self-pay

## 2023-05-30 NOTE — Telephone Encounter (Signed)
-----   Message from [place], [place] sent at 05/25/2023  4:03 PM EDT ----- Please update her form for school   You are immune to MMR and do not need vaccines You are immune to hepatitis B and do not need a vaccine

## 2023-05-30 NOTE — Telephone Encounter (Signed)
I left a message for the patient to return my call.  Regarding her lab results.

## 2023-06-06 ENCOUNTER — Other Ambulatory Visit (HOSPITAL_COMMUNITY): Payer: Self-pay

## 2023-06-06 ENCOUNTER — Telehealth: Payer: Self-pay | Admitting: Pharmacy Technician

## 2023-06-06 ENCOUNTER — Telehealth: Payer: Self-pay | Admitting: *Deleted

## 2023-06-06 NOTE — Telephone Encounter (Signed)
Patient was prescribed Aimovig. Can you see if a PA is needed? She is being told the cost is $900 so it doesn't sound like insurance is paying towards it.  Thanks!

## 2023-06-06 NOTE — Telephone Encounter (Signed)
Pharmacy Patient Advocate Encounter   Received notification from Pt Calls Messages that prior authorization for Aimovig 140MG /ML auto-injectors is required/requested.   Insurance verification completed.   The patient is insured through [facility] .   Per test claim:   PA submitted to [employer] via CoverMyMeds Key/confirmation #/EOC [facility] Status is pending

## 2023-06-06 NOTE — Telephone Encounter (Signed)
PA is required  Status of PA in separate encounter

## 2023-06-07 NOTE — Telephone Encounter (Signed)
Pharmacy Patient Advocate Encounter  Prior Authorization for Aimovig 140MG /ML auto-injectors has been APPROVED by [facility] from 06/07/2023 to 08/29/2023.   PA # PA Case ID #: [id number]  Unable to give copay-systems are down at this time.

## 2023-06-14 DIAGNOSIS — E663 Overweight: Secondary | ICD-10-CM | POA: Diagnosis not present

## 2023-06-14 DIAGNOSIS — E039 Hypothyroidism, unspecified: Secondary | ICD-10-CM | POA: Diagnosis not present

## 2023-06-14 DIAGNOSIS — G43909 Migraine, unspecified, not intractable, without status migrainosus: Secondary | ICD-10-CM | POA: Diagnosis not present

## 2023-06-17 ENCOUNTER — Other Ambulatory Visit: Payer: Self-pay | Admitting: Physician Assistant

## 2023-06-17 ENCOUNTER — Encounter: Payer: Self-pay | Admitting: Physician Assistant

## 2023-06-17 DIAGNOSIS — Z0184 Encounter for antibody response examination: Secondary | ICD-10-CM

## 2023-06-17 NOTE — Telephone Encounter (Signed)
Please advise and I can place order if needed

## 2023-06-20 ENCOUNTER — Other Ambulatory Visit: Payer: BC Managed Care – PPO

## 2023-06-20 DIAGNOSIS — E039 Hypothyroidism, unspecified: Secondary | ICD-10-CM | POA: Diagnosis not present

## 2023-06-20 DIAGNOSIS — E310 Autoimmune polyglandular failure: Secondary | ICD-10-CM | POA: Diagnosis not present

## 2023-06-20 DIAGNOSIS — Z0184 Encounter for antibody response examination: Secondary | ICD-10-CM | POA: Diagnosis not present

## 2023-06-21 LAB — VARICELLA ZOSTER ANTIBODY, IGG: Varicella IgG: 942.7 index

## 2023-07-04 DIAGNOSIS — E039 Hypothyroidism, unspecified: Secondary | ICD-10-CM | POA: Diagnosis not present

## 2023-07-04 DIAGNOSIS — E663 Overweight: Secondary | ICD-10-CM | POA: Diagnosis not present

## 2023-07-04 DIAGNOSIS — G43909 Migraine, unspecified, not intractable, without status migrainosus: Secondary | ICD-10-CM | POA: Diagnosis not present

## 2023-07-05 DIAGNOSIS — F411 Generalized anxiety disorder: Secondary | ICD-10-CM | POA: Diagnosis not present

## 2023-07-28 DIAGNOSIS — E039 Hypothyroidism, unspecified: Secondary | ICD-10-CM | POA: Diagnosis not present

## 2023-07-28 DIAGNOSIS — E663 Overweight: Secondary | ICD-10-CM | POA: Diagnosis not present

## 2023-07-28 DIAGNOSIS — Z9189 Other specified personal risk factors, not elsewhere classified: Secondary | ICD-10-CM | POA: Diagnosis not present

## 2023-07-28 DIAGNOSIS — G43909 Migraine, unspecified, not intractable, without status migrainosus: Secondary | ICD-10-CM | POA: Diagnosis not present

## 2023-08-02 DIAGNOSIS — F411 Generalized anxiety disorder: Secondary | ICD-10-CM | POA: Diagnosis not present

## 2023-08-02 NOTE — Telephone Encounter (Signed)
I called [employer] to obtain [employer], spoke with [place]. Auth#: [id number] (01/26/23-12/27/23)

## 2023-08-15 NOTE — Progress Notes (Unsigned)
08/16/23:  05/23/23: Patient reports that when she is able to take Qulipta in the Botox she does not have any migraines.  She states in the last 2 months she has not had any migraines.  She states typically weather would be a trigger for her.  We have had several thunderstorm that she did not develop a migraine.  Continues to use Imitrex for abortive therapy  02/09/23: 4 migraines a month. Continues Imitrex when she does have a headache.  11/08/22: Reports that Botox continues to work well for her.  Weather continues to be a trigger for her migraines.  Remains on Trokendi 150 mg daily and uses Imitrex for abortive therapy  08/05/2022: Botox continues to work well for her.  She reports that if there is a bad storm she typically will get a migraine otherwise she has been doing well  05/11/2022: botox continues to work well. Trigger for migraines is typically weather related   BOTOX PROCEDURE NOTE FOR MIGRAINE HEADACHE    Contraindications and precautions discussed with patient(above). Aseptic procedure was observed and patient tolerated procedure. Procedure performed by Butch Penny, NP  The condition has existed for more than 6 months, and pt does not have a diagnosis of ALS, Myasthenia Gravis or Lambert-Eaton Syndrome.  Risks and benefits of injections discussed and pt agrees to proceed with the procedure.  Written consent obtained  These injections are medically necessary. ]These injections do not cause sedations or hallucinations which the oral therapies may cause.  Indication/Diagnosis: chronic migraine BOTOX(J0585) injection was performed according to protocol by [facility]. 200 units of BOTOX was dissolved into 4 cc NS.      Botox- 200 units x 1 vial Lot: [id number] Expiration: 06/2025 NDC: 7253-6644-03   Bacteriostatic 0.9% Sodium Chloride- 4 mL  Lot: [id number] Expiration: 03/13/2024 NDC: 5638-7564-33   Dx: I95.188    Description of procedure:  The patient was placed in a  sitting position. The standard protocol was used for Botox as follows, with 5 units of Botox injected at each site:   -Procerus muscle, midline injection  -Corrugator muscle, bilateral injection  -Frontalis muscle, bilateral injection, with 2 sites each side, medial injection was performed in the upper one third of the frontalis muscle, in the region vertical from the medial inferior edge of the superior orbital rim. The lateral injection was again in the upper one third of the forehead vertically above the lateral limbus of the cornea, 1.5 cm lateral to the medial injection site.  -Temporalis muscle injection, 4 sites, bilaterally. The first injection was 3 cm above the tragus of the ear, second injection site was 1.5 cm to 3 cm up from the first injection site in line with the tragus of the ear. The third injection site was 1.5-3 cm forward between the first 2 injection sites. The fourth injection site was 1.5 cm posterior to the second injection site.  -Occipitalis muscle injection, 3 sites, bilaterally. The first injection was done one half way between the occipital protuberance and the tip of the mastoid process behind the ear. The second injection site was done lateral and superior to the first, 1 fingerbreadth from the first injection. The third injection site was 1 fingerbreadth superiorly and medially from the first injection site.  -Cervical paraspinal muscle injection, 2 sites, bilateral knee first injection site was 1 cm from the midline of the cervical spine, 3 cm inferior to the lower border of the occipital protuberance. The second injection site was 1.5 cm superiorly and laterally  to the first injection site.  -Trapezius muscle injection was performed at 3 sites, bilaterally. The first injection site was in the upper trapezius muscle halfway between the inflection point of the neck, and the acromion. The second injection site was one half way between the acromion and the first  injection site. The third injection was done between the first injection site and the inflection point of the neck.   Will return for repeat injection in 3 months.   A 200 units of Botox was used, 155 units were injected, the rest of the Botox was wasted. The patient tolerated the procedure well, there were no complications of the above procedure.  Butch Penny, MSN, NP-C 08/15/2023, 11:00 AM [facility] [address], [address] [place], [place] [address] [phone]

## 2023-08-16 ENCOUNTER — Ambulatory Visit (INDEPENDENT_AMBULATORY_CARE_PROVIDER_SITE_OTHER): Payer: BC Managed Care – PPO | Admitting: Adult Health

## 2023-08-16 DIAGNOSIS — G43711 Chronic migraine without aura, intractable, with status migrainosus: Secondary | ICD-10-CM

## 2023-08-16 MED ORDER — ONABOTULINUMTOXINA 200 UNITS IJ SOLR
155.0000 [IU] | Freq: Once | INTRAMUSCULAR | Status: AC
Start: 2023-08-16 — End: 2023-08-16
  Administered 2023-08-16: 155 [IU] via INTRAMUSCULAR

## 2023-08-16 NOTE — Progress Notes (Signed)
Botox- 200 units x 1 vial Lot: [id number]  Expiration: 11/2025 NDC: 3086-5784-69  Bacteriostatic 0.9% Sodium Chloride- 4 mL  Lot: [id number] Expiration: 03/13/2024 NDC: 4132-4401-02  Dx: V25.366 B/B Witnessed by : Toma Copier.RN

## 2023-08-23 DIAGNOSIS — E039 Hypothyroidism, unspecified: Secondary | ICD-10-CM | POA: Diagnosis not present

## 2023-08-23 DIAGNOSIS — G43909 Migraine, unspecified, not intractable, without status migrainosus: Secondary | ICD-10-CM | POA: Diagnosis not present

## 2023-08-23 DIAGNOSIS — Z9189 Other specified personal risk factors, not elsewhere classified: Secondary | ICD-10-CM | POA: Diagnosis not present

## 2023-08-23 DIAGNOSIS — E663 Overweight: Secondary | ICD-10-CM | POA: Diagnosis not present

## 2023-08-30 DIAGNOSIS — F411 Generalized anxiety disorder: Secondary | ICD-10-CM | POA: Diagnosis not present

## 2023-08-31 ENCOUNTER — Encounter: Payer: Self-pay | Admitting: Physician Assistant

## 2023-09-13 ENCOUNTER — Other Ambulatory Visit: Payer: Self-pay

## 2023-09-13 DIAGNOSIS — Z79890 Hormone replacement therapy (postmenopausal): Secondary | ICD-10-CM

## 2023-09-13 DIAGNOSIS — E2839 Other primary ovarian failure: Secondary | ICD-10-CM

## 2023-09-13 MED ORDER — ESTRADIOL 0.05 MG/24HR TD PTWK
0.0500 mg | MEDICATED_PATCH | TRANSDERMAL | 0 refills | Status: DC
Start: 2023-09-13 — End: 2023-12-08

## 2023-09-13 NOTE — Telephone Encounter (Signed)
Medication refill request: estradiol patch  Last AEX:  10/18/22 Next AEX: 12/13/23 Last MMG (if hormonal medication request): 12/28/22 Bi-rads 2 benign  Refill authorized: #12 0 rf pended for today

## 2023-09-18 ENCOUNTER — Encounter: Payer: Self-pay | Admitting: Adult Health

## 2023-09-19 ENCOUNTER — Telehealth: Payer: Self-pay

## 2023-09-19 ENCOUNTER — Other Ambulatory Visit (HOSPITAL_COMMUNITY): Payer: Self-pay

## 2023-09-19 NOTE — Telephone Encounter (Signed)
Pharmacy Patient Advocate Encounter   Received notification from Physician's Office that prior authorization for Aimovig 140MG /ML auto-injectors is required/requested.   Insurance verification completed.   The patient is insured through [facility] .   Per test claim: PA required; PA started via CoverMyMeds. KEY [id number] . Waiting for clinical questions to populate.

## 2023-09-19 NOTE — Telephone Encounter (Signed)
Pt is in need of renewal PA. Auth expired 08/29/23. She states insurance is the same. She is starting to get headaches again after being overdue on Aimovig. Please complete asap. She is getting benefit from it. Thank you!

## 2023-09-20 DIAGNOSIS — E663 Overweight: Secondary | ICD-10-CM | POA: Diagnosis not present

## 2023-09-20 DIAGNOSIS — E039 Hypothyroidism, unspecified: Secondary | ICD-10-CM | POA: Diagnosis not present

## 2023-09-20 DIAGNOSIS — G43909 Migraine, unspecified, not intractable, without status migrainosus: Secondary | ICD-10-CM | POA: Diagnosis not present

## 2023-09-20 DIAGNOSIS — Z9189 Other specified personal risk factors, not elsewhere classified: Secondary | ICD-10-CM | POA: Diagnosis not present

## 2023-09-21 NOTE — Telephone Encounter (Signed)
   Per last OV note there is no mention of how many days PT is having Headaches/Migraines etc-also if PT has had reduction in the need to use migraine rescue meds and for PT to continue on Botox and Aimovig she will have to have continues to have 4 or more migraine headaches days per month. Please advise-

## 2023-09-22 NOTE — Telephone Encounter (Signed)
Thank you for getting me the needed information-  Clinical questions have been submitted-awaiting determination.

## 2023-09-23 ENCOUNTER — Other Ambulatory Visit (HOSPITAL_COMMUNITY): Payer: Self-pay

## 2023-09-23 NOTE — Telephone Encounter (Signed)
Pharmacy Patient Advocate Encounter  Received notification from [facility] that Prior Authorization for Aimovig 140MG /ML auto-injectors has been APPROVED from 09-22-2023 to 09-21-2024. Ran test claim, Copay is $0.00. This test claim was processed through [facility]- copay amounts may vary at other pharmacies due to pharmacy/plan contracts, or as the patient moves through the different stages of their insurance plan.   PA #/Case ID/Reference #: [id number]

## 2023-09-27 DIAGNOSIS — F411 Generalized anxiety disorder: Secondary | ICD-10-CM | POA: Diagnosis not present

## 2023-10-14 ENCOUNTER — Other Ambulatory Visit: Payer: Self-pay

## 2023-10-14 DIAGNOSIS — Z79890 Hormone replacement therapy (postmenopausal): Secondary | ICD-10-CM

## 2023-10-14 DIAGNOSIS — E2839 Other primary ovarian failure: Secondary | ICD-10-CM

## 2023-10-14 NOTE — Telephone Encounter (Signed)
Med refill request: progesterone 100 mg Last AEX: 10/18/22 Next AEX: 12/13/23 Last MMG (if hormonal med) 12/28/22 Refill authorized: progesterone 100mg  #90.  Sent to provider for review.

## 2023-10-17 MED ORDER — PROGESTERONE MICRONIZED 100 MG PO CAPS: 100.0000 mg | ORAL_CAPSULE | Freq: Every day | ORAL | 0 refills | Status: DC

## 2023-10-19 DIAGNOSIS — E663 Overweight: Secondary | ICD-10-CM | POA: Diagnosis not present

## 2023-10-19 DIAGNOSIS — Z79899 Other long term (current) drug therapy: Secondary | ICD-10-CM | POA: Diagnosis not present

## 2023-10-19 DIAGNOSIS — Z6827 Body mass index (BMI) 27.0-27.9, adult: Secondary | ICD-10-CM | POA: Diagnosis not present

## 2023-10-19 DIAGNOSIS — G43909 Migraine, unspecified, not intractable, without status migrainosus: Secondary | ICD-10-CM | POA: Diagnosis not present

## 2023-10-19 DIAGNOSIS — E039 Hypothyroidism, unspecified: Secondary | ICD-10-CM | POA: Diagnosis not present

## 2023-10-24 ENCOUNTER — Ambulatory Visit: Payer: BC Managed Care – PPO | Admitting: Nurse Practitioner

## 2023-10-25 DIAGNOSIS — F411 Generalized anxiety disorder: Secondary | ICD-10-CM | POA: Diagnosis not present

## 2023-11-07 ENCOUNTER — Other Ambulatory Visit: Payer: Self-pay | Admitting: Orthopedic Surgery

## 2023-11-07 DIAGNOSIS — M25511 Pain in right shoulder: Secondary | ICD-10-CM | POA: Diagnosis not present

## 2023-11-07 DIAGNOSIS — M75100 Unspecified rotator cuff tear or rupture of unspecified shoulder, not specified as traumatic: Secondary | ICD-10-CM

## 2023-11-18 ENCOUNTER — Other Ambulatory Visit: Payer: Self-pay | Admitting: Physician Assistant

## 2023-11-18 DIAGNOSIS — Z1231 Encounter for screening mammogram for malignant neoplasm of breast: Secondary | ICD-10-CM

## 2023-11-21 ENCOUNTER — Ambulatory Visit (INDEPENDENT_AMBULATORY_CARE_PROVIDER_SITE_OTHER): Payer: BC Managed Care – PPO | Admitting: Adult Health

## 2023-11-21 DIAGNOSIS — G43711 Chronic migraine without aura, intractable, with status migrainosus: Secondary | ICD-10-CM

## 2023-11-21 MED ORDER — ONABOTULINUMTOXINA 200 UNITS IJ SOLR
155.0000 [IU] | Freq: Once | INTRAMUSCULAR | Status: AC
  Administered 2023-11-21: 155 [IU] via INTRAMUSCULAR

## 2023-11-21 NOTE — Progress Notes (Signed)
Botox- 200 units x 1 vial Lot: [id number] Expiration: 08/2025 NDC: 4540-9811-91  Bacteriostatic 0.9% Sodium Chloride- 4 mL  Lot: [id number] Expiration: 03/13/2024 NDC: 6213-0865-78  Dx: I69.629 B/B Witnessed by Truitt Leep, RN

## 2023-11-21 NOTE — Progress Notes (Signed)
.   11/21/23: Reports that Botox is working well for her.  Remains on Aimovig as well.  Returns today for injections  08/16/23: She remains on on Botox and Aimovig.  States that this continues to work well for her.  Denies any new symptoms.  05/23/23: Patient reports that when she is able to take Qulipta in the Botox she does not have any migraines.  She states in the last 2 months she has not had any migraines.  She states typically weather would be a trigger for her.  We have had several thunderstorm that she did not develop a migraine.  Continues to use Imitrex for abortive therapy  02/09/23: 4 migraines a month. Continues Imitrex when she does have a headache.  11/08/22: Reports that Botox continues to work well for her.  Weather continues to be a trigger for her migraines.  Remains on Trokendi 150 mg daily and uses Imitrex for abortive therapy  08/05/2022: Botox continues to work well for her.  She reports that if there is a bad storm she typically will get a migraine otherwise she has been doing well  05/11/2022: botox continues to work well. Trigger for migraines is typically weather related   BOTOX PROCEDURE NOTE FOR MIGRAINE HEADACHE    Contraindications and precautions discussed with patient(above). Aseptic procedure was observed and patient tolerated procedure. Procedure performed by Butch Penny, NP  The condition has existed for more than 6 months, and pt does not have a diagnosis of ALS, Myasthenia Gravis or Lambert-Eaton Syndrome.  Risks and benefits of injections discussed and pt agrees to proceed with the procedure.  Written consent obtained  These injections are medically necessary. ]These injections do not cause sedations or hallucinations which the oral therapies may cause.  Indication/Diagnosis: chronic migraine BOTOX(J0585) injection was performed according to protocol by [employer]. 200 units of BOTOX was dissolved into 4 cc NS.    Botox- 200 units x 1 vial Lot:  [id number] Expiration: 08/2025 NDC: 5284-1324-40   Bacteriostatic 0.9% Sodium Chloride- 4 mL  Lot: [id number] Expiration: 03/13/2024 NDC: 3664-4034-74   Dx: Q59.563      Description of procedure:  The patient was placed in a sitting position. The standard protocol was used for Botox as follows, with 5 units of Botox injected at each site:   -Procerus muscle, midline injection  -Corrugator muscle, bilateral injection  -Frontalis muscle, bilateral injection, with 2 sites each side, medial injection was performed in the upper one third of the frontalis muscle, in the region vertical from the medial inferior edge of the superior orbital rim. The lateral injection was again in the upper one third of the forehead vertically above the lateral limbus of the cornea, 1.5 cm lateral to the medial injection site.  -Temporalis muscle injection, 4 sites, bilaterally. The first injection was 3 cm above the tragus of the ear, second injection site was 1.5 cm to 3 cm up from the first injection site in line with the tragus of the ear. The third injection site was 1.5-3 cm forward between the first 2 injection sites. The fourth injection site was 1.5 cm posterior to the second injection site.  -Occipitalis muscle injection, 3 sites, bilaterally. The first injection was done one half way between the occipital protuberance and the tip of the mastoid process behind the ear. The second injection site was done lateral and superior to the first, 1 fingerbreadth from the first injection. The third injection site was 1 fingerbreadth superiorly and medially from the first  injection site.  -Cervical paraspinal muscle injection, 2 sites, bilateral knee first injection site was 1 cm from the midline of the cervical spine, 3 cm inferior to the lower border of the occipital protuberance. The second injection site was 1.5 cm superiorly and laterally to the first injection site.  -Trapezius muscle injection was performed at 3  sites, bilaterally. The first injection site was in the upper trapezius muscle halfway between the inflection point of the neck, and the acromion. The second injection site was one half way between the acromion and the first injection site. The third injection was done between the first injection site and the inflection point of the neck.   Will return for repeat injection in 3 months.   A 200 units of Botox was used, 155 units were injected, the rest of the Botox was wasted. The patient tolerated the procedure well, there were no complications of the above procedure.  Butch Penny, MSN, NP-C 11/21/2023, 2:48 PM [facility] [address], [address] [place], [place] [address] [phone]

## 2023-11-22 DIAGNOSIS — F411 Generalized anxiety disorder: Secondary | ICD-10-CM | POA: Diagnosis not present

## 2023-11-24 ENCOUNTER — Ambulatory Visit
Admission: RE | Admit: 2023-11-24 | Discharge: 2023-11-24 | Disposition: A | Discharge status: Home / Self Care | Payer: BC Managed Care – PPO | Source: Ambulatory Visit | Attending: Orthopedic Surgery | Admitting: Orthopedic Surgery

## 2023-11-24 DIAGNOSIS — M75100 Unspecified rotator cuff tear or rupture of unspecified shoulder, not specified as traumatic: Secondary | ICD-10-CM

## 2023-11-24 DIAGNOSIS — M25511 Pain in right shoulder: Secondary | ICD-10-CM | POA: Diagnosis not present

## 2023-11-24 DIAGNOSIS — M7581 Other shoulder lesions, right shoulder: Secondary | ICD-10-CM | POA: Diagnosis not present

## 2023-11-30 DIAGNOSIS — Z6827 Body mass index (BMI) 27.0-27.9, adult: Secondary | ICD-10-CM | POA: Diagnosis not present

## 2023-11-30 DIAGNOSIS — Z9189 Other specified personal risk factors, not elsewhere classified: Secondary | ICD-10-CM | POA: Diagnosis not present

## 2023-11-30 DIAGNOSIS — G43909 Migraine, unspecified, not intractable, without status migrainosus: Secondary | ICD-10-CM | POA: Diagnosis not present

## 2023-11-30 DIAGNOSIS — E663 Overweight: Secondary | ICD-10-CM | POA: Diagnosis not present

## 2023-11-30 DIAGNOSIS — E039 Hypothyroidism, unspecified: Secondary | ICD-10-CM | POA: Diagnosis not present

## 2023-12-02 DIAGNOSIS — M7501 Adhesive capsulitis of right shoulder: Secondary | ICD-10-CM | POA: Diagnosis not present

## 2023-12-06 ENCOUNTER — Other Ambulatory Visit: Payer: Self-pay | Admitting: Radiology

## 2023-12-06 DIAGNOSIS — Z79890 Hormone replacement therapy (postmenopausal): Secondary | ICD-10-CM

## 2023-12-06 DIAGNOSIS — E2839 Other primary ovarian failure: Secondary | ICD-10-CM

## 2023-12-08 NOTE — Telephone Encounter (Signed)
Med refill request:  estradiol 0.05 mg patch Last AEX: 10/18/22 TW Next AEX: 12/13/23 TW Last MMG (if hormonal med) 12/17/22 BI-RADS 0, 12/28/22 MMG DIAG UNI left BI-RADS 2 benign Refill authorized:  estradiol 0.05 mg patch.  Sent to provider for review.

## 2023-12-13 ENCOUNTER — Encounter: Payer: Self-pay | Admitting: Nurse Practitioner

## 2023-12-13 ENCOUNTER — Other Ambulatory Visit (HOSPITAL_COMMUNITY)
Admission: RE | Admit: 2023-12-13 | Discharge: 2023-12-13 | Disposition: A | Discharge status: Home / Self Care | Payer: BC Managed Care – PPO | Source: Ambulatory Visit | Attending: Nurse Practitioner | Admitting: Nurse Practitioner

## 2023-12-13 ENCOUNTER — Ambulatory Visit (INDEPENDENT_AMBULATORY_CARE_PROVIDER_SITE_OTHER): Payer: BC Managed Care – PPO | Admitting: Nurse Practitioner

## 2023-12-13 VITALS — BP 112/80 | HR 87 | Ht 67.0 in | Wt 184.0 lb

## 2023-12-13 DIAGNOSIS — Z01419 Encounter for gynecological examination (general) (routine) without abnormal findings: Secondary | ICD-10-CM | POA: Diagnosis not present

## 2023-12-13 DIAGNOSIS — Z124 Encounter for screening for malignant neoplasm of cervix: Secondary | ICD-10-CM | POA: Diagnosis not present

## 2023-12-13 DIAGNOSIS — Z79890 Hormone replacement therapy (postmenopausal): Secondary | ICD-10-CM | POA: Diagnosis not present

## 2023-12-13 DIAGNOSIS — E2839 Other primary ovarian failure: Secondary | ICD-10-CM

## 2023-12-13 DIAGNOSIS — Z13820 Encounter for screening for osteoporosis: Secondary | ICD-10-CM

## 2023-12-13 MED ORDER — PROGESTERONE MICRONIZED 100 MG PO CAPS: 100.0000 mg | ORAL_CAPSULE | Freq: Every day | ORAL | 3 refills | Status: DC

## 2023-12-13 MED ORDER — ESTRADIOL 0.05 MG/24HR TD PTWK: 0.0500 mg | MEDICATED_PATCH | TRANSDERMAL | 3 refills | Status: DC

## 2023-12-13 NOTE — Progress Notes (Signed)
 rut betterton Apr 08, 1981 [id number]   History:  42 y.o. G2P0002 presents for annual exam. POI at age 59. Started HRT last year and feels way better. Was on Nuvaring prior. No bleeding. Normal pap history. Auras with migraines, is doing botox  now for management. Hypothyroidism managed by endocrinology.   Gynecologic History Patient's last menstrual period was 01/15/2015 (exact date).   Contraception: post menopausal status and vasectomy Sexually active: Yes  Health Maintenance Last Pap: 09/26/2018. Results were: Normal neg HPV Last mammogram: 12/17/2022. Results were. Possible left breast asymmetry, follow up imaging showed benign lymph node Last colonoscopy: Not indicated Last Dexa: age 35  Past medical history, past surgical history, family history and social history were all reviewed and documented in the EPIC chart. Married. Works PT at [employer]. 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 Husband is education officer, environmental. 52 yo daughter, 28 yo son. Son T1DM.   ROS:  A ROS was performed and pertinent positives and negatives are included.  Exam:  Vitals:   12/13/23 1510  BP: 112/80  Pulse: 87  SpO2: 99%  Weight: 184 lb (83.5 kg)  Height: 5' 7 (1.702 m)      Body mass index is 28.82 kg/m.  General appearance:  Normal Thyroid :  Symmetrical, normal in size, without palpable masses or nodularity. Respiratory  Auscultation:  Clear without wheezing or rhonchi Cardiovascular  Auscultation:  Regular rate, without rubs, murmurs or gallops  Edema/varicosities:  Not grossly evident Abdominal  Soft,nontender, without masses, guarding or rebound.  Liver/spleen:  No organomegaly noted  Hernia:  None appreciated  Skin  Inspection:  Grossly normal. Slight hyperpigmentation or right groin. No redness Breasts: Examined lying and sitting.   Right: Without masses, retractions, discharge or axillary adenopathy.   Left: Without masses, retractions, discharge or axillary  adenopathy. Pelvic: External genitalia:  no lesions              Urethra:  normal appearing urethra with no masses, tenderness or lesions              Bartholins and Skenes: normal                 Vagina: normal appearing vagina with normal color and discharge, no lesions              Cervix: no lesions Bimanual Exam:  Uterus:  no masses or tenderness              Adnexa: no mass, fullness, tenderness              Rectovaginal: Deferred              Anus:  normal, no lesions  Patient informed chaperone available to be present for breast and pelvic exam. Patient has requested no chaperone to be present. Patient has been advised what will be completed during breast and pelvic exam.    Assessment/Plan:  42 y.o. G2P0002 for annual exam.   Well female exam with routine gynecological exam - Education provided on SBEs, importance of preventative screenings, current guidelines, high calcium  diet, regular exercise, and multivitamin daily. Labs with PCP and endocrinology.  Hormone replacement therapy - Plan: estradiol  (CLIMARA ) 0.05 mg/24hr patch weekly, progesterone  (PROMETRIUM ) 100 MG capsule nightly. Switched from Nuvaring to bioidentical hormones last year for heart and bone health. She also feels much better.   Premature ovarian insufficiency - Plan: estradiol  (CLIMARA ) 0.05 mg/24hr patch weekly, progesterone  (PROMETRIUM ) 100 MG capsule nightly. At age 51. Started HRT  last year.   Cervical cancer screening - Plan: Cytology - PAP( [place]). Normal pap history.   Screening for osteoporosis - Plan: DG Bone Density. DXA at age 56. Recommend repeating now.   Screening for breast cancer - Normal mammogram history. Continue annual screenings. Normal breast exam today.   Return in about 1 year (around 12/12/2024) for Annual.       Annabella DELENA [facility], 3:28 PM 12/13/2023

## 2023-12-15 ENCOUNTER — Other Ambulatory Visit: Payer: Self-pay | Admitting: Nurse Practitioner

## 2023-12-15 DIAGNOSIS — Z13820 Encounter for screening for osteoporosis: Secondary | ICD-10-CM

## 2023-12-15 DIAGNOSIS — E2839 Other primary ovarian failure: Secondary | ICD-10-CM

## 2023-12-19 NOTE — Therapy (Addendum)
 OUTPATIENT PHYSICAL THERAPY SHOULDER EVALUATION   Patient Name: Nancy [name] MRN: [id number] DOB:1981-12-06, 43 y.o., female Today's Date: 12/21/2023  END OF SESSION:  PT End of Session - 12/21/23 1301     Visit Number 1    Number of Visits 16    Date for PT Re-Evaluation 03/14/24    Authorization Type BCBS 30 VL- AUTH REQUIRED finish submission    Authorization - Visit Number 1    Authorization - Number of Visits 30    Progress Note Due on Visit 10    PT Start Time 1302    PT Stop Time 1340    PT Time Calculation (min) 38 min    Activity Tolerance Patient tolerated treatment well    Behavior During Therapy WFL for tasks assessed/performed             Past Medical History:  Diagnosis Date   Allergy    COVID    Eczema    Fatigue    Hashimoto's disease    Hypothyroidism, acquired, autoimmune    Migraines    Thyroiditis, autoimmune    Hashimotos    Vaginal delivery 2006, 2009   Past Surgical History:  Procedure Laterality Date   MOLE REMOVAL  12/13/2008   tongue growth  12/13/2008   WISDOM TOOTH EXTRACTION  12/13/1997   Patient Active Problem List   Diagnosis Date Noted   Palpitations 09/09/2022   Excessive daytime sleepiness 04/26/2022   Muscle weakness 04/26/2022   Arm weakness 04/26/2022   Autoimmune disorder (HCC) 04/26/2022   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 10/07/2021   Premature ovarian failure 09/15/2020   Chronic migraine without aura, with intractable migraine, so stated, with status migrainosus 07/31/2020   Sleeping difficulty 10/15/2015   Migraine with aura and without status migrainosus, not intractable 08/19/2015   Tremor 04/14/2015   Goiter 09/21/2012   Hypothyroidism, acquired, autoimmune    Thyroiditis, autoimmune    Fatigue     PCP: Job Lukes, PA  REFERRING PROVIDER: Josefina Chew, MD  REFERRING  DIAG: M75.100 (ICD-10-CM) - Tear of rotator cuff, unspecified laterality, unspecified tear extent, unspecified whether traumatic  THERAPY DIAG:  Acute pain of right shoulder  Muscle weakness (generalized)  Rationale for Evaluation and Treatment: Rehabilitation  ONSET DATE: March 2023  SUBJECTIVE:                                                                                                                                                                                      SUBJECTIVE STATEMENT: States in March she was doing swimmers presses and  hurt both of her shoulders and thought it would go away. States the left arm got better but right arm never did. States that right before thanksgiving she was reaching towards her shopping cart that was rolling away from her and had increased pain.  Reports she has had increased pain since that incident in her right shoulder.  Reports having when she tries to do a little bit of yoga but her right shoulder is very tight and she cannot do all of the things she needs to do.  Reports occasional numbness and tingling in the right arm but it does not last.  Reports dressing can also be painful and sometimes she needs her husband to assist with donning and doffing sweaters.  Reports she also cannot lay comfortably on her right side.    Hand dominance: Right  PERTINENT HISTORY: Migraines, Fatigue  PAIN:  Are you having pain? Yes: NPRS scale: 7 Pain location: right shoulder anterior and posterior Pain description: sharp/shooting/achy Aggravating factors: OH reaching, reaching out to the sides, reaches behind back Relieving factors: rest, heat/ice/ pressure  PRECAUTIONS: None  RED FLAGS: None   WEIGHT BEARING RESTRICTIONS: No  FALLS:  Has patient fallen in last 6 months? 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  DIAGNOSTIC FINDINGS:  MRI 11/24/23 IMPRESSION: 1. Mild supraspinatus, infraspinatus and subscapularis tendinosis without a tear.     PATIENT SURVEYS:  FOTO 51%   COGNITION: Overall cognitive status: Within functional limits for tasks assessed     SENSATION: Not tested  POSTURE: Right shoulder anterior compared to left arm held in internal rotation and adduction on right    UE Measurements Upper Extremity Right EVAL Left EVAL   A/PROM MMT A/PROM MMT  Shoulder Flexion 105*  WFL   Shoulder Extension      Shoulder Abduction 80  WFL    Shoulder Adduction      Shoulder Internal Rotation/at 30 degrees abd Reaches to right PSIS*/ 40*  Reaches to T2SP/80   Shoulder External Rotation/ at 30 degrees abd Reaches to base of head*/5*  Reaches to T4 SP/90   Elbow Flexion      Elbow Extension      Wrist Flexion      Wrist Extension      Wrist Supination      Wrist Pronation      Wrist Ulnar Deviation      Wrist Radial Deviation      Grip Strength NA 85# NA 80#    (Blank rows = not tested)   * pain   JOINT MOBILITY TESTING:  Hypomobility noted in right shoulder in all directions, muscle guarding noted in all range of motion in right shoulder  PALPATION:  Increased tenderness to palpation in right: infraspinatus , pec minor and biceps tendon  TREATMENT DATE:  12/21/2023  Therapeutic Exercise:  Aerobic: Supine: Shoulder external rotation x 15 right shoulder abduction and horizontal adduction x 15 right, scapular retraction x 15, passive range of motion with endrange stretching right shoulder 3 minutes Prone:  Seated: Scapular retraction x 15, shoulder external rotation x 15 be  Standing: Pendulum swing x 10 right Neuromuscular Re-education: Manual Therapy: Percussion gun with towel  for vibration intervention to right shoulder 10 minutes Therapeutic Activity: Self Care: Trigger Point Dry Needling:  Modalities:    PATIENT EDUCATION:  Education details: on current presentation, on HEP, on clinical outcomes score and POC, on safe use of percussion gun with use of vibration motion exercises, on benefits and risks associated with dry needling Person educated: Patient Education method: Explanation, Demonstration, and Handouts Education comprehension: verbalized understanding   HOME EXERCISE PROGRAM: [id number]  ASSESSMENT:  CLINICAL IMPRESSION: 12/21/2023  Eval: Patient presents to physical therapy with complaints of chronic right shoulder pain.  Patient presents with limitations in range of motion, strength and overall functional mobility.  Patient tolerated use of vibration intervention to right shoulder significantly well demonstrating improved range of motion in right shoulder and less muscle guarding.  Educated patient in safe use of percussion gun for vibration as well as importance of mobility exercises.  Patient demonstrated at least 40 degrees of right shoulder external rotation after use of percussion gun.  Overall patient would greatly benefit from skilled physical therapy to improve physical impairments and improve overall function and quality of life.  OBJECTIVE IMPAIRMENTS: decreased activity tolerance, decreased ROM, decreased strength, impaired UE functional use, postural dysfunction, and pain.   ACTIVITY LIMITATIONS: carrying, lifting, dressing, and reach over head  PARTICIPATION LIMITATIONS: meal prep, cleaning, driving, shopping, and occupation  PERSONAL FACTORS: Time since onset of injury/illness/exacerbation are also affecting patient's functional outcome.   REHAB POTENTIAL: Good  CLINICAL DECISION MAKING: Stable/uncomplicated  EVALUATION COMPLEXITY: Low   GOALS: Goals reviewed with patient? yes  SHORT TERM GOALS: Target date: 02/01/2024   Patient will be independent in self management strategies to improve quality of life and functional outcomes. Baseline: New Program Goal status: INITIAL  2.  Patient will report at least 50% improvement in overall symptoms and/or function to demonstrate improved functional mobility Baseline: 0% better Goal status: INITIAL  3.  Patient will be able to don and doff shirts and sweaters independently to improve ability to dress herself. Baseline: Unable requires husband assist Goal status: INITIAL      LONG TERM GOALS: Target date: 03/14/2024   Patient will report at least 75% improvement in overall symptoms and/or function to demonstrate improved functional mobility Baseline: 0% better Goal status: INITIAL  2.  Patient will improve score on FOTO outcomes measure to projected score to demonstrate overall improved function and QOL Baseline: see above Goal status: INITIAL  3.  Patient will be able to demonstrate at least 120 degrees of right shoulder flexion and abduction to improve overhead reach Baseline: Unable Goal status: INITIAL  4.  Patient will demonstrate at least 60 degrees of right shoulder external rotation with arm at 45 degrees of abduction to demonstrate improved shoulder mobility. Baseline: Unable see above Goal status: INITIAL   PLAN:  PT FREQUENCY: 1-2x/week of 16 visits over 12 weeks certification.  PT DURATION: 12 weeks  PLANNED INTERVENTIONS: 97110-Therapeutic exercises, 97530- Therapeutic activity, [id number]- Neuromuscular re-education, 97535- Self Care, 02859- Manual therapy, [id number]- Gait training, [phone]- Orthotic Fit/training, [phone]- Canalith repositioning, [id number]- Aquatic Therapy, 97014- Electrical stimulation (unattended), [id number]- Ionotophoresis 4mg /ml  Dexamethasone, Patient/Family education, Balance training, Stair training, Taping, Dry Needling, Joint mobilization, Joint manipulation, Spinal manipulation, Spinal mobilization, Cryotherapy, and Moist  heat   PLAN FOR NEXT SESSION: Percussion gun, range of motion exercises, progress strengthening as able, trial dry needling if indicated   2:18 PM, 12/21/23 Olivia Church, DPT Physical Therapy with [place]

## 2023-12-20 ENCOUNTER — Other Ambulatory Visit: Payer: Self-pay | Admitting: Adult Health

## 2023-12-20 DIAGNOSIS — F411 Generalized anxiety disorder: Secondary | ICD-10-CM | POA: Diagnosis not present

## 2023-12-20 LAB — CYTOLOGY - PAP
Comment: NEGATIVE
Diagnosis: NEGATIVE
High risk HPV: NEGATIVE

## 2023-12-21 ENCOUNTER — Encounter: Payer: Self-pay | Admitting: Physical Therapy

## 2023-12-21 ENCOUNTER — Ambulatory Visit: Payer: BC Managed Care – PPO | Admitting: Physical Therapy

## 2023-12-21 DIAGNOSIS — M6281 Muscle weakness (generalized): Secondary | ICD-10-CM

## 2023-12-21 DIAGNOSIS — M25511 Pain in right shoulder: Secondary | ICD-10-CM

## 2023-12-21 NOTE — Patient Instructions (Signed)

## 2023-12-21 NOTE — Addendum Note (Signed)
 Addended by: Tereasa Coop R on: 12/21/2023 04:28 PM   Modules accepted: Orders

## 2023-12-26 DIAGNOSIS — Z1231 Encounter for screening mammogram for malignant neoplasm of breast: Secondary | ICD-10-CM

## 2023-12-26 NOTE — Therapy (Signed)
 OUTPATIENT PHYSICAL THERAPY SHOULDER TREATMENT   Patient Name: Nancy [name] MRN: [id number] DOB:Dec 30, 1980, 43 y.o., female Today's Date: 12/27/2023  END OF SESSION:  PT End of Session - 12/27/23 1305     Visit Number 2    Number of Visits 16    Date for PT Re-Evaluation 03/14/24    Authorization Type BCBS 30 VL- AUTH  approved 7 visits 12/26/23-02/23/24 (not including evaluation)    Authorization - Visit Number 1    Authorization - Number of Visits 7    Progress Note Due on Visit 10    PT Start Time 1305    PT Stop Time 1343    PT Time Calculation (min) 38 min    Activity Tolerance Patient tolerated treatment well    Behavior During Therapy WFL for tasks assessed/performed              Past Medical History:  Diagnosis Date   Allergy    COVID    Eczema    Fatigue    Hashimoto's disease    Hypothyroidism, acquired, autoimmune    Migraines    Thyroiditis, autoimmune    Hashimotos    Vaginal delivery 2006, 2009   Past Surgical History:  Procedure Laterality Date   MOLE REMOVAL  12/13/2008   tongue growth  12/13/2008   WISDOM TOOTH EXTRACTION  12/13/1997   Patient Active Problem List   Diagnosis Date Noted   Palpitations 09/09/2022   Excessive daytime sleepiness 04/26/2022   Muscle weakness 04/26/2022   Arm weakness 04/26/2022   Autoimmune disorder (HCC) 04/26/2022   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 10/07/2021   Premature ovarian failure 09/15/2020   Chronic migraine without aura, with intractable migraine, so stated, with status migrainosus 07/31/2020   Sleeping difficulty 10/15/2015   Migraine with aura and without status migrainosus, not intractable 08/19/2015   Tremor 04/14/2015   Goiter 09/21/2012   Hypothyroidism, acquired, autoimmune    Thyroiditis, autoimmune    Fatigue     PCP: Job Lukes, PA  REFERRING  PROVIDER: Josefina Chew, MD  REFERRING DIAG: M75.100 (ICD-10-CM) - Tear of rotator cuff, unspecified laterality, unspecified tear extent, unspecified whether traumatic  THERAPY DIAG:  Acute pain of right shoulder  Muscle weakness (generalized)  Rationale for Evaluation and Treatment: Rehabilitation  ONSET DATE: March 2023  SUBJECTIVE:                                                                                                                                                                                      SUBJECTIVE STATEMENT: 12/27/2023 Got percussion  gun and doing stretches, feeling a little better.   Eval: States in March she was doing swimmers presses and hurt both of her shoulders and thought it would go away. States the left arm got better but right arm never did. States that right before thanksgiving she was reaching towards her shopping cart that was rolling away from her and had increased pain.  Reports she has had increased pain since that incident in her right shoulder.  Reports having when she tries to do a little bit of yoga but her right shoulder is very tight and she cannot do all of the things she needs to do.  Reports occasional numbness and tingling in the right arm but it does not last.  Reports dressing can also be painful and sometimes she needs her husband to assist with donning and doffing sweaters.  Reports she also cannot lay comfortably on her right side.    Hand dominance: Right  PERTINENT HISTORY: Migraines, Fatigue  PAIN:  Are you having pain? Yes: NPRS scale: 5 Pain location: right shoulder anterior and posterior Pain description: sharp/shooting/achy Aggravating factors: OH reaching, reaching out to the sides, reaches behind back Relieving factors: rest, heat/ice/ pressure  PRECAUTIONS: None  RED FLAGS: None   WEIGHT BEARING RESTRICTIONS: No  FALLS:  Has patient fallen in last 6 months? 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  DIAGNOSTIC FINDINGS:  MRI 11/24/23 IMPRESSION: 1. Mild supraspinatus, infraspinatus and subscapularis tendinosis without a tear.     PATIENT SURVEYS:  FOTO 51%   COGNITION: Overall cognitive status: Within functional limits for tasks assessed     SENSATION: Not tested  POSTURE: Right shoulder anterior compared to left arm held in internal rotation and adduction on right    UE Measurements Upper Extremity Right EVAL Left EVAL   A/PROM MMT A/PROM MMT  Shoulder Flexion 105*  WFL   Shoulder Extension      Shoulder Abduction 80  WFL    Shoulder Adduction      Shoulder Internal Rotation/at 30 degrees abd Reaches to right PSIS*/ 40*  Reaches to T2SP/80   Shoulder External Rotation/ at 30 degrees abd Reaches to base of head*/5*  Reaches to T4 SP/90   Elbow Flexion      Elbow Extension      Wrist Flexion      Wrist Extension      Wrist Supination      Wrist Pronation      Wrist Ulnar Deviation      Wrist Radial Deviation      Grip Strength NA 85# NA 80#    (Blank rows = not tested)   * pain   JOINT MOBILITY TESTING:  Hypomobility noted in right shoulder in all directions, muscle guarding noted in all range of motion in right shoulder  PALPATION:  Increased tenderness to palpation in right: infraspinatus , pec minor and biceps tendon  TREATMENT DATE:  12/27/2023  Therapeutic Exercise:  Aerobic: Supine: PROM of right shoulder 10 minutes (after percussion gun) Shoulder external rotation with cane R 2 minutes, shoulder flexion 2.5 minutes with cane Quad: rocks backs 2 minutes, scap protraction/retraction x20 Each B  Seated:    Standing:  shoulder flexion up  wall 2 minutes Neuromuscular Re-education: Manual Therapy: Percussion gun with towel for vibration intervention to right shoulder 8 minutes, gentle right UE traction - tolerated well 3 minutes  Therapeutic Activity: Self Care: Trigger Point Dry Needling:  Modalities:    PATIENT EDUCATION:  Education details: on HEP Person educated: Patient Education method: Explanation, Demonstration, and Handouts Education comprehension: verbalized understanding   HOME EXERCISE PROGRAM: [id number]  ASSESSMENT:  CLINICAL IMPRESSION: 12/27/2023 Tolerated percussion gun well with improved shoulder motion noted after intervention. Added additional exercises to HEP. Overall tolerated everything well. Fatigue in arms noted end of session. Will continue with current POC as tolerated.   Eval: Patient presents to physical therapy with complaints of chronic right shoulder pain.  Patient presents with limitations in range of motion, strength and overall functional mobility.  Patient tolerated use of vibration intervention to right shoulder significantly well demonstrating improved range of motion in right shoulder and less muscle guarding.  Educated patient in safe use of percussion gun for vibration as well as importance of mobility exercises.  Patient demonstrated at least 40 degrees of right shoulder external rotation after use of percussion gun.  Overall patient would greatly benefit from skilled physical therapy to improve physical impairments and improve overall function and quality of life.  OBJECTIVE IMPAIRMENTS: decreased activity tolerance, decreased ROM, decreased strength, impaired UE functional use, postural dysfunction, and pain.   ACTIVITY LIMITATIONS: carrying, lifting, dressing, and reach over head  PARTICIPATION LIMITATIONS: meal prep, cleaning, driving, shopping, and occupation  PERSONAL FACTORS: Time since onset of injury/illness/exacerbation are also affecting patient's functional outcome.    REHAB POTENTIAL: Good  CLINICAL DECISION MAKING: Stable/uncomplicated  EVALUATION COMPLEXITY: Low   GOALS: Goals reviewed with patient? yes  SHORT TERM GOALS: Target date: 02/01/2024  Patient will be independent in self management strategies to improve quality of life and functional outcomes. Baseline: New Program Goal status: INITIAL  2.  Patient will report at least 50% improvement in overall symptoms and/or function to demonstrate improved functional mobility Baseline: 0% better Goal status: INITIAL  3.  Patient will be able to don and doff shirts and sweaters independently to improve ability to dress herself. Baseline: Unable requires husband assist Goal status: INITIAL      LONG TERM GOALS: Target date: 03/14/2024   Patient will report at least 75% improvement in overall symptoms and/or function to demonstrate improved functional mobility Baseline: 0% better Goal status: INITIAL  2.  Patient will improve score on FOTO outcomes measure to projected score to demonstrate overall improved function and QOL Baseline: see above Goal status: INITIAL  3.  Patient will be able to demonstrate at least 120 degrees of right shoulder flexion and abduction to improve overhead reach Baseline: Unable Goal status: INITIAL  4.  Patient will demonstrate at least 60 degrees of right shoulder external rotation with arm at 45 degrees of abduction to demonstrate improved shoulder mobility. Baseline: Unable see above Goal status: INITIAL   PLAN:  PT FREQUENCY: 1-2x/week of 16 visits over 12 weeks certification.  PT DURATION: 12 weeks  PLANNED INTERVENTIONS: 97110-Therapeutic exercises, 97530- Therapeutic activity, [id number]- Neuromuscular re-education, 97535- Self Care, [id number]- Manual therapy, [id number]- Gait training, [phone]- Orthotic Fit/training,  04007- Canalith repositioning, [id number]- Aquatic Therapy, [id number]- Electrical stimulation (unattended), [phone]- Ionotophoresis 4mg /ml Dexamethasone,  Patient/Family education, Balance training, Stair training, Taping, Dry Needling, Joint mobilization, Joint manipulation, Spinal manipulation, Spinal mobilization, Cryotherapy, and Moist heat   PLAN FOR NEXT SESSION: Percussion gun, range of motion exercises, progress strengthening as able, trial dry needling if indicated   1:49 PM, 12/27/23 Olivia Church, DPT Physical Therapy with [facility]

## 2023-12-27 ENCOUNTER — Ambulatory Visit (INDEPENDENT_AMBULATORY_CARE_PROVIDER_SITE_OTHER): Payer: BC Managed Care – PPO | Admitting: Physical Therapy

## 2023-12-27 ENCOUNTER — Encounter: Payer: Self-pay | Admitting: Physical Therapy

## 2023-12-27 DIAGNOSIS — M25511 Pain in right shoulder: Secondary | ICD-10-CM

## 2023-12-27 DIAGNOSIS — M6281 Muscle weakness (generalized): Secondary | ICD-10-CM

## 2023-12-29 ENCOUNTER — Ambulatory Visit: Payer: BC Managed Care – PPO | Admitting: Physical Therapy

## 2023-12-29 ENCOUNTER — Encounter: Payer: Self-pay | Admitting: Physical Therapy

## 2023-12-29 DIAGNOSIS — M6281 Muscle weakness (generalized): Secondary | ICD-10-CM | POA: Diagnosis not present

## 2023-12-29 DIAGNOSIS — M25511 Pain in right shoulder: Secondary | ICD-10-CM

## 2023-12-29 NOTE — Therapy (Signed)
OUTPATIENT PHYSICAL THERAPY SHOULDER TREATMENT   Patient Name: Nancy [name] MRN: [id number] DOB:April 02, 1981, 43 y.o., female Today's Date: 12/29/2023  END OF SESSION:  PT End of Session - 12/29/23 1342     Visit Number 3    Number of Visits 16    Date for PT Re-Evaluation 03/14/24    Authorization Type BCBS 30 VL- AUTH  approved 7 visits 12/26/23-02/23/24 (not including evaluation)    Authorization - Visit Number 2    Authorization - Number of Visits 7    Progress Note Due on Visit 10    PT Start Time 1345    PT Stop Time 1425    PT Time Calculation (min) 40 min    Activity Tolerance Patient tolerated treatment well    Behavior During Therapy WFL for tasks assessed/performed               Past Medical History:  Diagnosis Date   Allergy    COVID    Eczema    Fatigue    Hashimoto's disease    Hypothyroidism, acquired, autoimmune    Migraines    Thyroiditis, autoimmune    Hashimotos    Vaginal delivery 2006, 2009   Past Surgical History:  Procedure Laterality Date   MOLE REMOVAL  12/13/2008   tongue growth  12/13/2008   WISDOM TOOTH EXTRACTION  12/13/1997   Patient Active Problem List   Diagnosis Date Noted   Palpitations 09/09/2022   Excessive daytime sleepiness 04/26/2022   Muscle weakness 04/26/2022   Arm weakness 04/26/2022   Autoimmune disorder (HCC) 04/26/2022   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 10/07/2021   Premature ovarian failure 09/15/2020   Chronic migraine without aura, with intractable migraine, so stated, with status migrainosus 07/31/2020   Sleeping difficulty 10/15/2015   Migraine with aura and without status migrainosus, not intractable 08/19/2015   Tremor 04/14/2015   Goiter 09/21/2012   Hypothyroidism, acquired, autoimmune    Thyroiditis, autoimmune    Fatigue     PCP: Jarold Motto, PA  REFERRING  PROVIDER: Teryl Lucy, MD  REFERRING DIAG: M75.100 (ICD-10-CM) - Tear of rotator cuff, unspecified laterality, unspecified tear extent, unspecified whether traumatic  THERAPY DIAG:  Acute pain of right shoulder  Muscle weakness (generalized)  Rationale for Evaluation and Treatment: Rehabilitation  ONSET DATE: March 2023  SUBJECTIVE:                                                                                                                                                                                      SUBJECTIVE STATEMENT: 12/29/2023 Been  using percussion gun and feeling good.   Eval: States in March she was doing swimmers presses and hurt both of her shoulders and thought it would go away. States the left arm got better but right arm never did. States that right before thanksgiving she was reaching towards her shopping cart that was rolling away from her and had increased pain.  Reports she has had increased pain since that incident in her right shoulder.  Reports having when she tries to do a little bit of yoga but her right shoulder is very tight and she cannot do all of the things she needs to do.  Reports occasional numbness and tingling in the right arm but it does not last.  Reports dressing can also be painful and sometimes she needs her husband to assist with donning and doffing sweaters.  Reports she also cannot lay comfortably on her right side.    Hand dominance: Right  PERTINENT HISTORY: Migraines, Fatigue  PAIN:  Are you having pain? Yes: NPRS scale: 0/10 Pain location: right shoulder anterior and posterior Pain description: sharp/shooting/achy Aggravating factors: OH reaching, reaching out to the sides, reaches behind back Relieving factors: rest, heat/ice/ pressure  PRECAUTIONS: None  RED FLAGS: None   WEIGHT BEARING RESTRICTIONS: No  FALLS:  Has patient fallen in last 6 months? 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  DIAGNOSTIC FINDINGS:  MRI 11/24/23 IMPRESSION: 1. Mild supraspinatus, infraspinatus and subscapularis tendinosis without a tear.     PATIENT SURVEYS:  FOTO 51%   COGNITION: Overall cognitive status: Within functional limits for tasks assessed     SENSATION: Not tested  POSTURE: Right shoulder anterior compared to left arm held in internal rotation and adduction on right    UE Measurements Upper Extremity Right EVAL Left EVAL   A/PROM MMT A/PROM MMT  Shoulder Flexion 105*  WFL   Shoulder Extension      Shoulder Abduction 80  WFL    Shoulder Adduction      Shoulder Internal Rotation/at 30 degrees abd Reaches to right PSIS*/ 40*  Reaches to T2SP/80   Shoulder External Rotation/ at 30 degrees abd Reaches to base of head*/5*  Reaches to T4 SP/90   Elbow Flexion      Elbow Extension      Wrist Flexion      Wrist Extension      Wrist Supination      Wrist Pronation      Wrist Ulnar Deviation      Wrist Radial Deviation      Grip Strength NA 85# NA 80#    (Blank rows = not tested)   * pain   JOINT MOBILITY TESTING:  Hypomobility noted in right shoulder in all directions, muscle guarding noted in all range of motion in right shoulder  PALPATION:  Increased tenderness to palpation in right: infraspinatus , pec minor and biceps tendon  TREATMENT DATE:  12/29/2023  Therapeutic Exercise:  Aerobic: UBE 2 minutes fwd and 2 minutes backwards Supine:   Quad:    Seated:  pulleys flexion 2 minutes, pulleys scaption 2 minutes, self inferior traction in 1 minute x2 rounds  S/l right shoulder ER 3x8 5" holds, shoulder flexion x10 R then with 3# weight 2x10  Standing:  UE ranger  right shoulder flexion and aide to side motion 3x5 each   Neuromuscular Re-education: Manual Therapy: Percussion gun with towel for vibration intervention to right shoulder 8 minutes Therapeutic Activity: Self Care: Trigger Point Dry Needling:  Modalities:    PATIENT EDUCATION:  Education details: on HEP Person educated: Patient Education method: Explanation, Demonstration, and Handouts Education comprehension: verbalized understanding   HOME EXERCISE PROGRAM: [id number]  ASSESSMENT:  CLINICAL IMPRESSION: 12/29/2023 Transitioned to standing and side lying exercises. Performing percussion gun as needed between different exercises to help with pain and soreness. Overall patient is doing very well and demonstrating improved R shoulder ROM. Added new exercises to HEP, will continue with current POC as tolerated.  Eval: Patient presents to physical therapy with complaints of chronic right shoulder pain.  Patient presents with limitations in range of motion, strength and overall functional mobility.  Patient tolerated use of vibration intervention to right shoulder significantly well demonstrating improved range of motion in right shoulder and less muscle guarding.  Educated patient in safe use of percussion gun for vibration as well as importance of mobility exercises.  Patient demonstrated at least 40 degrees of right shoulder external rotation after use of percussion gun.  Overall patient would greatly benefit from skilled physical therapy to improve physical impairments and improve overall function and quality of life.  OBJECTIVE IMPAIRMENTS: decreased activity tolerance, decreased ROM, decreased strength, impaired UE functional use, postural dysfunction, and pain.   ACTIVITY LIMITATIONS: carrying, lifting, dressing, and reach over head  PARTICIPATION LIMITATIONS: meal prep, cleaning, driving, shopping, and occupation  PERSONAL FACTORS: Time since onset of injury/illness/exacerbation are  also affecting patient's functional outcome.   REHAB POTENTIAL: Good  CLINICAL DECISION MAKING: Stable/uncomplicated  EVALUATION COMPLEXITY: Low   GOALS: Goals reviewed with patient? yes  SHORT TERM GOALS: Target date: 02/01/2024  Patient will be independent in self management strategies to improve quality of life and functional outcomes. Baseline: New Program Goal status: INITIAL  2.  Patient will report at least 50% improvement in overall symptoms and/or function to demonstrate improved functional mobility Baseline: 0% better Goal status: INITIAL  3.  Patient will be able to don and doff shirts and sweaters independently to improve ability to dress herself. Baseline: Unable requires husband assist Goal status: INITIAL      LONG TERM GOALS: Target date: 03/14/2024   Patient will report at least 75% improvement in overall symptoms and/or function to demonstrate improved functional mobility Baseline: 0% better Goal status: INITIAL  2.  Patient will improve score on FOTO outcomes measure to projected score to demonstrate overall improved function and QOL Baseline: see above Goal status: INITIAL  3.  Patient will be able to demonstrate at least 120 degrees of right shoulder flexion and abduction to improve overhead reach Baseline: Unable Goal status: INITIAL  4.  Patient will demonstrate at least 60 degrees of right shoulder external rotation with arm at 45 degrees of abduction to demonstrate improved shoulder mobility. Baseline: Unable see above Goal status: INITIAL   PLAN:  PT FREQUENCY: 1-2x/week of 16 visits over 12 weeks certification.  PT DURATION: 12 weeks  PLANNED INTERVENTIONS:  97110-Therapeutic exercises, 97530- Therapeutic activity, [id number]- Neuromuscular re-education, [phone]- Self Care, 60454- Manual therapy, [phone]- Gait training, [phone]- Orthotic Fit/training, [phone]- Canalith repositioning, [id number]- Aquatic Therapy, 97014- Electrical stimulation (unattended),  [phone]- Ionotophoresis 4mg /ml Dexamethasone, Patient/Family education, Balance training, Stair training, Taping, Dry Needling, Joint mobilization, Joint manipulation, Spinal manipulation, Spinal mobilization, Cryotherapy, and Moist heat   PLAN FOR NEXT SESSION: Percussion gun, range of motion exercises, progress strengthening as able, trial dry needling if indicated   2:27 PM, 12/29/23 Tereasa Coop, DPT Physical Therapy with [facility]

## 2024-01-03 ENCOUNTER — Encounter: Payer: Self-pay | Admitting: Physical Therapy

## 2024-01-03 ENCOUNTER — Ambulatory Visit: Payer: BC Managed Care – PPO | Admitting: Physical Therapy

## 2024-01-03 DIAGNOSIS — M6281 Muscle weakness (generalized): Secondary | ICD-10-CM | POA: Diagnosis not present

## 2024-01-03 DIAGNOSIS — M25511 Pain in right shoulder: Secondary | ICD-10-CM

## 2024-01-03 NOTE — Therapy (Signed)
OUTPATIENT PHYSICAL THERAPY SHOULDER TREATMENT   Patient Name: Nancy [name] MRN: [id number] DOB:18-Jul-1981, 43 y.o., female Today's Date: 01/03/2024  END OF SESSION:  PT End of Session - 01/03/24 1242     Visit Number 4    Number of Visits 16    Date for PT Re-Evaluation 03/14/24    Authorization Type BCBS 30 VL- AUTH  approved 7 visits 12/26/23-02/23/24 (not including evaluation)    Authorization - Visit Number 3    Authorization - Number of Visits 7    Progress Note Due on Visit 10    PT Start Time 1245    PT Stop Time 1325    PT Time Calculation (min) 40 min    Activity Tolerance Patient tolerated treatment well    Behavior During Therapy WFL for tasks assessed/performed               Past Medical History:  Diagnosis Date   Allergy    COVID    Eczema    Fatigue    Hashimoto's disease    Hypothyroidism, acquired, autoimmune    Migraines    Thyroiditis, autoimmune    Hashimotos    Vaginal delivery 2006, 2009   Past Surgical History:  Procedure Laterality Date   MOLE REMOVAL  12/13/2008   tongue growth  12/13/2008   WISDOM TOOTH EXTRACTION  12/13/1997   Patient Active Problem List   Diagnosis Date Noted   Palpitations 09/09/2022   Excessive daytime sleepiness 04/26/2022   Muscle weakness 04/26/2022   Arm weakness 04/26/2022   Autoimmune disorder (HCC) 04/26/2022   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 10/07/2021   Premature ovarian failure 09/15/2020   Chronic migraine without aura, with intractable migraine, so stated, with status migrainosus 07/31/2020   Sleeping difficulty 10/15/2015   Migraine with aura and without status migrainosus, not intractable 08/19/2015   Tremor 04/14/2015   Goiter 09/21/2012   Hypothyroidism, acquired, autoimmune    Thyroiditis, autoimmune    Fatigue     PCP: Jarold Motto, PA  REFERRING  PROVIDER: Teryl Lucy, MD  REFERRING DIAG: M75.100 (ICD-10-CM) - Tear of rotator cuff, unspecified laterality, unspecified tear extent, unspecified whether traumatic  THERAPY DIAG:  Acute pain of right shoulder  Muscle weakness (generalized)  Rationale for Evaluation and Treatment: Rehabilitation  ONSET DATE: March 2023  SUBJECTIVE:                                                                                                                                                                                      SUBJECTIVE STATEMENT: 01/03/2024 Been  feeling good no real pain. Overall feels about 50% better since start of PT.   Eval: States in March she was doing swimmers presses and hurt both of her shoulders and thought it would go away. States the left arm got better but right arm never did. States that right before thanksgiving she was reaching towards her shopping cart that was rolling away from her and had increased pain.  Reports she has had increased pain since that incident in her right shoulder.  Reports having when she tries to do a little bit of yoga but her right shoulder is very tight and she cannot do all of the things she needs to do.  Reports occasional numbness and tingling in the right arm but it does not last.  Reports dressing can also be painful and sometimes she needs her husband to assist with donning and doffing sweaters.  Reports she also cannot lay comfortably on her right side.    Hand dominance: Right  PERTINENT HISTORY: Migraines, Fatigue  PAIN:  Are you having pain? Yes: NPRS scale: 0/10 Pain location: right shoulder anterior and posterior Pain description: sharp/shooting/achy Aggravating factors: OH reaching, reaching out to the sides, reaches behind back Relieving factors: rest, heat/ice/ pressure  PRECAUTIONS: None  RED FLAGS: None   WEIGHT BEARING RESTRICTIONS: No  FALLS:  Has patient fallen in last 6 months?  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  DIAGNOSTIC FINDINGS:  MRI 11/24/23 IMPRESSION: 1. Mild supraspinatus, infraspinatus and subscapularis tendinosis without a tear.     PATIENT SURVEYS:  FOTO 51%   COGNITION: Overall cognitive status: Within functional limits for tasks assessed     SENSATION: Not tested  POSTURE: Right shoulder anterior compared to left arm held in internal rotation and adduction on right    UE Measurements Upper Extremity Right 1/21 Left 1/21   A/PROM MMT A/PROM MMT  Shoulder Flexion 124  WFL   Shoulder Extension      Shoulder Abduction 140  WFL    Shoulder Adduction      Shoulder Internal Rotation/at 30 degrees abd Reaches to right PSIS*/ 45*  Reaches to T2SP/80   Shoulder External Rotation/ at 45 degrees abd Reaches to to T1 SP /30*  Reaches to T4 SP/90   Elbow Flexion      Elbow Extension      Wrist Flexion      Wrist Extension      Wrist Supination      Wrist Pronation      Wrist Ulnar Deviation      Wrist Radial Deviation      Grip Strength NA 85# NA 80#    (Blank rows = not tested)   * pain   JOINT MOBILITY TESTING:  Hypomobility noted in right shoulder in all directions, muscle guarding noted in all range of motion in right shoulder  PALPATION:  Increased tenderness to palpation in right: infraspinatus , pec minor and biceps tendon  TREATMENT DATE:  01/03/2024  Therapeutic Exercise:  Aerobic: UBE 2 minutes fwd and 2 minutes backwards Supine:  PROM of right shoulder in ER/Ir at different degrees of shoulder abd Prone:scap retraction x20 5" holds B, shoulder extension x20 5" holds B , mini snow angel with scap  retraction 2x5 5" holds B  Seated:  pulleys flexion 2 minutes, pulleys scaption 2 minutes, self inferior traction in 1 minute x2 rounds  S/l:  Standing:   shoulder extension 2x10 5" holds with towel, shoulder IR with towel behind back x10 10" holds R Quad: cat cow x15 5" holds Each position  Neuromuscular Re-education: Manual Therapy: Percussion gun with towel for vibration to right shoulder 8 minutes, long axis traction to right arm, grade II/III AP to right GHJ Therapeutic Activity: Self Care: Trigger Point Dry Needling:  Modalities:    PATIENT EDUCATION:  Education details: on HEP Person educated: Patient Education method: Explanation, Demonstration, and Handouts Education comprehension: verbalized understanding   HOME EXERCISE PROGRAM: [id number]  ASSESSMENT:  CLINICAL IMPRESSION: 01/03/2024  Able to progress exercises without increase in pain. Patient still limited in shoulder external rotation the most. Added behind back exercises to help with anterior capsule mobility. Overall patient doing well and demonstrating improved ROM compared to evaluation on this date. Will continue with current POC as tolerated.   Eval: Patient presents to physical therapy with complaints of chronic right shoulder pain.  Patient presents with limitations in range of motion, strength and overall functional mobility.  Patient tolerated use of vibration intervention to right shoulder significantly well demonstrating improved range of motion in right shoulder and less muscle guarding.  Educated patient in safe use of percussion gun for vibration as well as importance of mobility exercises.  Patient demonstrated at least 40 degrees of right shoulder external rotation after use of percussion gun.  Overall patient would greatly benefit from skilled physical therapy to improve physical impairments and improve overall function and quality of life.  OBJECTIVE IMPAIRMENTS: decreased activity tolerance, decreased  ROM, decreased strength, impaired UE functional use, postural dysfunction, and pain.   ACTIVITY LIMITATIONS: carrying, lifting, dressing, and reach over head  PARTICIPATION LIMITATIONS: meal prep, cleaning, driving, shopping, and occupation  PERSONAL FACTORS: Time since onset of injury/illness/exacerbation are also affecting patient's functional outcome.   REHAB POTENTIAL: Good  CLINICAL DECISION MAKING: Stable/uncomplicated  EVALUATION COMPLEXITY: Low   GOALS: Goals reviewed with patient? yes  SHORT TERM GOALS: Target date: 02/01/2024  Patient will be independent in self management strategies to improve quality of life and functional outcomes. Baseline: New Program Goal status: MET  2.  Patient will report at least 50% improvement in overall symptoms and/or function to demonstrate improved functional mobility Baseline: 0% better Goal status: MET  3.  Patient will be able to don and doff shirts and sweaters independently to improve ability to dress herself. Baseline: Unable requires husband assist Goal status: PROGRESSING      LONG TERM GOALS: Target date: 03/14/2024   Patient will report at least 75% improvement in overall symptoms and/or function to demonstrate improved functional mobility Baseline: 0% better Goal status: PROGRESSING  2.  Patient will improve score on FOTO outcomes measure to projected score to demonstrate overall improved function and QOL Baseline: see above Goal status: PROGRESSING  3.  Patient will be able to demonstrate at least 120 degrees of right shoulder flexion and abduction to improve overhead reach Baseline: Unable Goal status: MET  4.  Patient will demonstrate at least 60 degrees of  right shoulder external rotation with arm at 45 degrees of abduction to demonstrate improved shoulder mobility. Baseline: Unable see above Goal status: PROGRESSING   PLAN:  PT FREQUENCY: 1-2x/week of 16 visits over 12 weeks certification.  PT DURATION:  12 weeks  PLANNED INTERVENTIONS: 97110-Therapeutic exercises, 97530- Therapeutic activity, [id number]- Neuromuscular re-education, [phone]- Self Care, 19147- Manual therapy, [phone]- Gait training, [phone]- Orthotic Fit/training, [phone]- Canalith repositioning, [phone]- Aquatic Therapy, 97014- Electrical stimulation (unattended), [phone]- Ionotophoresis 4mg /ml Dexamethasone, Patient/Family education, Balance training, Stair training, Taping, Dry Needling, Joint mobilization, Joint manipulation, Spinal manipulation, Spinal mobilization, Cryotherapy, and Moist heat   PLAN FOR NEXT SESSION: Percussion gun for pain/mobility as needed, range of motion exercises (shoulder ER and IR most limited), progress strengthening as able, trial dry needling if indicated   1:30 PM, 01/03/24 Tereasa Coop, DPT Physical Therapy with [facility]

## 2024-01-04 ENCOUNTER — Other Ambulatory Visit: Payer: Self-pay | Admitting: Radiology

## 2024-01-04 DIAGNOSIS — Z79890 Hormone replacement therapy (postmenopausal): Secondary | ICD-10-CM

## 2024-01-04 DIAGNOSIS — E2839 Other primary ovarian failure: Secondary | ICD-10-CM

## 2024-01-04 NOTE — Telephone Encounter (Signed)
Med refill request: generic Climara 0.05 mg Last AEX: 12/13/23 TW Next AEX: none scheduled Last MMG (if hormonal med) 12/28/22 BI-RADS 2 benign Last refilled 12/13/23 #4 with no additional refills.  Sent to provider for review.

## 2024-01-10 ENCOUNTER — Encounter: Payer: Self-pay | Admitting: Physical Therapy

## 2024-01-10 ENCOUNTER — Ambulatory Visit: Payer: BC Managed Care – PPO | Admitting: Physical Therapy

## 2024-01-10 DIAGNOSIS — M25511 Pain in right shoulder: Secondary | ICD-10-CM

## 2024-01-10 DIAGNOSIS — M6281 Muscle weakness (generalized): Secondary | ICD-10-CM | POA: Diagnosis not present

## 2024-01-10 NOTE — Therapy (Signed)
OUTPATIENT PHYSICAL THERAPY SHOULDER TREATMENT   Patient Name: Nancy [name] MRN: [id number] DOB:1981/10/02, 43 y.o., female Today's Date: 01/10/2024  END OF SESSION:  PT End of Session - 01/10/24 1522     Visit Number 5    Number of Visits 16    Date for PT Re-Evaluation 03/14/24    Authorization Type BCBS 30 VL- AUTH  approved 7 visits 12/26/23-02/23/24 (not including evaluation)    Authorization - Visit Number 4    Authorization - Number of Visits 7    Progress Note Due on Visit 10    PT Start Time 1519    PT Stop Time 1558    PT Time Calculation (min) 39 min    Activity Tolerance Patient tolerated treatment well    Behavior During Therapy WFL for tasks assessed/performed                Past Medical History:  Diagnosis Date   Allergy    COVID    Eczema    Fatigue    Hashimoto's disease    Hypothyroidism, acquired, autoimmune    Migraines    Thyroiditis, autoimmune    Hashimotos    Vaginal delivery 2006, 2009   Past Surgical History:  Procedure Laterality Date   MOLE REMOVAL  12/13/2008   tongue growth  12/13/2008   WISDOM TOOTH EXTRACTION  12/13/1997   Patient Active Problem List   Diagnosis Date Noted   Palpitations 09/09/2022   Excessive daytime sleepiness 04/26/2022   Muscle weakness 04/26/2022   Arm weakness 04/26/2022   Autoimmune disorder (HCC) 04/26/2022   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 10/07/2021   Premature ovarian failure 09/15/2020   Chronic migraine without aura, with intractable migraine, so stated, with status migrainosus 07/31/2020   Sleeping difficulty 10/15/2015   Migraine with aura and without status migrainosus, not intractable 08/19/2015   Tremor 04/14/2015   Goiter 09/21/2012   Hypothyroidism, acquired, autoimmune    Thyroiditis, autoimmune    Fatigue     PCP: Jarold Motto, PA  REFERRING  PROVIDER: Teryl Lucy, MD  REFERRING DIAG: M75.100 (ICD-10-CM) - Tear of rotator cuff, unspecified laterality, unspecified tear extent, unspecified whether traumatic  THERAPY DIAG:  Acute pain of right shoulder  Muscle weakness (generalized)  Rationale for Evaluation and Treatment: Rehabilitation  ONSET DATE: March 2023  SUBJECTIVE:                                                                                                                                                                                      SUBJECTIVE STATEMENT: 01/10/2024  Feeling pretty good, nothing good since last time. Its a little sore but not bad, I haven't stretched or moved as much as some days. Have had to write a lot the past couple days, its not been friendly to the shoulder.   Eval: States in March she was doing swimmers presses and hurt both of her shoulders and thought it would go away. States the left arm got better but right arm never did. States that right before thanksgiving she was reaching towards her shopping cart that was rolling away from her and had increased pain.  Reports she has had increased pain since that incident in her right shoulder.  Reports having when she tries to do a little bit of yoga but her right shoulder is very tight and she cannot do all of the things she needs to do.  Reports occasional numbness and tingling in the right arm but it does not last.  Reports dressing can also be painful and sometimes she needs her husband to assist with donning and doffing sweaters.  Reports she also cannot lay comfortably on her right side.    Hand dominance: Right  PERTINENT HISTORY: Migraines, Fatigue  PAIN:  Are you having pain? Yes: NPRS scale: 3-4/10 Pain location: right shoulder anterior Pain description: dull  Aggravating factors: OH reaching, reaching out to the sides, reaches behind back Relieving factors: rest, heat/ice/ pressure  PRECAUTIONS: None  RED  FLAGS: None   WEIGHT BEARING RESTRICTIONS: No  FALLS:  Has patient fallen in last 6 months? 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  DIAGNOSTIC FINDINGS:  MRI 11/24/23 IMPRESSION: 1. Mild supraspinatus, infraspinatus and subscapularis tendinosis without a tear.     PATIENT SURVEYS:  FOTO 51%   COGNITION: Overall cognitive status: Within functional limits for tasks assessed     SENSATION: Not tested  POSTURE: Right shoulder anterior compared to left arm held in internal rotation and adduction on right    UE Measurements Upper Extremity Right 1/21 Left 1/21   A/PROM MMT A/PROM MMT  Shoulder Flexion 124  WFL   Shoulder Extension      Shoulder Abduction 140  WFL    Shoulder Adduction      Shoulder Internal Rotation/at 30 degrees abd Reaches to right PSIS*/ 45*  Reaches to T2SP/80   Shoulder External Rotation/ at 45 degrees abd Reaches to to T1 SP /30*  Reaches to T4 SP/90   Elbow Flexion      Elbow Extension      Wrist Flexion      Wrist Extension      Wrist Supination      Wrist Pronation      Wrist Ulnar Deviation      Wrist Radial Deviation      Grip Strength NA 85# NA 80#    (Blank rows = not tested)   * pain   JOINT MOBILITY TESTING:  Hypomobility noted in right shoulder in all directions, muscle guarding noted in all range of motion in right shoulder  PALPATION:  Increased tenderness to palpation in right: infraspinatus , pec minor and biceps tendon  TREATMENT DATE:  01/10/2024   Therapeutic Exercise:  Aerobic:  Supine:  shoulder ER/IR stretching in different degrees of ABD  Prone: prone shoulder extension R 1# x12, prone T R UE  0# x10, prone scap retractions 1# B x12   Seated:  thoracic flexion/extensions x12, pulleys for shoulder flexion 10x5 second holds    S/l:  Standing: biceps/pec stretch on wall R UE 2x30 seconds    Quad:  Neuromuscular Re-education: Manual Therapy: percussion gun with towel to R shoulder (anterior and lateral delts) Therapeutic Activity: Self Care: Trigger Point Dry Needling:  Modalities:        Previous:   Therapeutic Exercise:  Aerobic: UBE 2 minutes fwd and 2 minutes backwards Supine:  PROM of right shoulder in ER/Ir at different degrees of shoulder abd Prone:scap retraction x20 5" holds B, shoulder extension x20 5" holds B , mini snow angel with scap retraction 2x5 5" holds B  Seated:  pulleys flexion 2 minutes, pulleys scaption 2 minutes, self inferior traction in 1 minute x2 rounds  S/l:  Standing:   shoulder extension 2x10 5" holds with towel, shoulder IR with towel behind back x10 10" holds R Quad: cat cow x15 5" holds Each position  Neuromuscular Re-education: Manual Therapy: Percussion gun with towel for vibration to right shoulder 8 minutes, long axis traction to right arm, grade II/III AP to right GHJ Therapeutic Activity: Self Care: Trigger Point Dry Needling:  Modalities:    PATIENT EDUCATION:  Education details: on HEP Person educated: Patient Education method: Explanation, Demonstration, and Handouts Education comprehension: verbalized understanding   HOME EXERCISE PROGRAM: [id number]  ASSESSMENT:  CLINICAL IMPRESSION: 01/10/2024   Pt arrives today doing OK, still having some shoulder stiffness and weakness. Upper trap compensation patterns pretty strong today. Continued with functional exercises and stretches, also continued with percussion gun as this has been helpful in managing her pain. Will continue efforts.   Eval: Patient presents to physical therapy with complaints of chronic right shoulder pain.  Patient presents with limitations in range of  motion, strength and overall functional mobility.  Patient tolerated use of vibration intervention to right shoulder significantly well demonstrating improved range of motion in right shoulder and less muscle guarding.  Educated patient in safe use of percussion gun for vibration as well as importance of mobility exercises.  Patient demonstrated at least 40 degrees of right shoulder external rotation after use of percussion gun.  Overall patient would greatly benefit from skilled physical therapy to improve physical impairments and improve overall function and quality of life.  OBJECTIVE IMPAIRMENTS: decreased activity tolerance, decreased ROM, decreased strength, impaired UE functional use, postural dysfunction, and pain.   ACTIVITY LIMITATIONS: carrying, lifting, dressing, and reach over head  PARTICIPATION LIMITATIONS: meal prep, cleaning, driving, shopping, and occupation  PERSONAL FACTORS: Time since onset of injury/illness/exacerbation are also affecting patient's functional outcome.   REHAB POTENTIAL: Good  CLINICAL DECISION MAKING: Stable/uncomplicated  EVALUATION COMPLEXITY: Low   GOALS: Goals reviewed with patient? yes  SHORT TERM GOALS: Target date: 02/01/2024  Patient will be independent in self management strategies to improve quality of life and functional outcomes. Baseline: New Program Goal status: MET  2.  Patient will report at least 50% improvement in overall symptoms and/or function to demonstrate improved functional mobility Baseline: 0% better Goal status: MET  3.  Patient will be able to don and doff shirts and sweaters independently to improve ability to dress herself. Baseline: Unable requires husband assist Goal status: PROGRESSING  LONG TERM GOALS: Target date: 03/14/2024   Patient will report at least 75% improvement in overall symptoms and/or function to demonstrate improved functional mobility Baseline: 0% better Goal status: PROGRESSING  2.   Patient will improve score on FOTO outcomes measure to projected score to demonstrate overall improved function and QOL Baseline: see above Goal status: PROGRESSING  3.  Patient will be able to demonstrate at least 120 degrees of right shoulder flexion and abduction to improve overhead reach Baseline: Unable Goal status: MET  4.  Patient will demonstrate at least 60 degrees of right shoulder external rotation with arm at 45 degrees of abduction to demonstrate improved shoulder mobility. Baseline: Unable see above Goal status: PROGRESSING   PLAN:  PT FREQUENCY: 1-2x/week of 16 visits over 12 weeks certification.  PT DURATION: 12 weeks  PLANNED INTERVENTIONS: 97110-Therapeutic exercises, 97530- Therapeutic activity, [id number]- Neuromuscular re-education, [phone]- Self Care, 60454- Manual therapy, [phone]- Gait training, [phone]- Orthotic Fit/training, [phone]- Canalith repositioning, [phone]- Aquatic Therapy, 97014- Electrical stimulation (unattended), [phone]- Ionotophoresis 4mg /ml Dexamethasone, Patient/Family education, Balance training, Stair training, Taping, Dry Needling, Joint mobilization, Joint manipulation, Spinal manipulation, Spinal mobilization, Cryotherapy, and Moist heat   PLAN FOR NEXT SESSION: Percussion gun for pain/mobility as needed, range of motion exercises (shoulder ER and IR most limited), progress strengthening as able, trial dry needling if indicated, try MET next visit for ER/IR ROM    Nedra Hai, PT, DPT 01/10/24 3:59 PM

## 2024-01-11 ENCOUNTER — Ambulatory Visit
Admission: RE | Admit: 2024-01-11 | Discharge: 2024-01-11 | Disposition: A | Discharge status: Home / Self Care | Payer: BC Managed Care – PPO | Source: Ambulatory Visit | Attending: Physician Assistant | Admitting: Physician Assistant

## 2024-01-11 DIAGNOSIS — E663 Overweight: Secondary | ICD-10-CM | POA: Diagnosis not present

## 2024-01-11 DIAGNOSIS — Z1231 Encounter for screening mammogram for malignant neoplasm of breast: Secondary | ICD-10-CM | POA: Diagnosis not present

## 2024-01-11 DIAGNOSIS — E039 Hypothyroidism, unspecified: Secondary | ICD-10-CM | POA: Diagnosis not present

## 2024-01-11 DIAGNOSIS — Z9189 Other specified personal risk factors, not elsewhere classified: Secondary | ICD-10-CM | POA: Diagnosis not present

## 2024-01-11 DIAGNOSIS — G43909 Migraine, unspecified, not intractable, without status migrainosus: Secondary | ICD-10-CM | POA: Diagnosis not present

## 2024-01-11 DIAGNOSIS — E559 Vitamin D deficiency, unspecified: Secondary | ICD-10-CM | POA: Diagnosis not present

## 2024-01-12 ENCOUNTER — Encounter: Payer: Self-pay | Admitting: Physical Therapy

## 2024-01-12 ENCOUNTER — Ambulatory Visit: Payer: BC Managed Care – PPO | Admitting: Physical Therapy

## 2024-01-12 DIAGNOSIS — M6281 Muscle weakness (generalized): Secondary | ICD-10-CM | POA: Diagnosis not present

## 2024-01-12 DIAGNOSIS — M25511 Pain in right shoulder: Secondary | ICD-10-CM | POA: Diagnosis not present

## 2024-01-12 NOTE — Therapy (Signed)
OUTPATIENT PHYSICAL THERAPY SHOULDER TREATMENT   Patient Name: Nancy [name] MRN: [id number] DOB:Mar 25, 1981, 43 y.o., female Today's Date: 01/12/2024  END OF SESSION:  PT End of Session - 01/12/24 1537     Visit Number 6    Number of Visits 16    Date for PT Re-Evaluation 03/14/24    Authorization Type BCBS 30 VL- AUTH  approved 7 visits 12/26/23-02/23/24 (not including evaluation)    Authorization - Visit Number 5    Authorization - Number of Visits 7    Progress Note Due on Visit 10    PT Start Time 1519    PT Stop Time 1600    PT Time Calculation (min) 41 min    Activity Tolerance Patient tolerated treatment well    Behavior During Therapy WFL for tasks assessed/performed                 Past Medical History:  Diagnosis Date   Allergy    COVID    Eczema    Fatigue    Hashimoto's disease    Hypothyroidism, acquired, autoimmune    Migraines    Thyroiditis, autoimmune    Hashimotos    Vaginal delivery 2006, 2009   Past Surgical History:  Procedure Laterality Date   MOLE REMOVAL  12/13/2008   tongue growth  12/13/2008   WISDOM TOOTH EXTRACTION  12/13/1997   Patient Active Problem List   Diagnosis Date Noted   Palpitations 09/09/2022   Excessive daytime sleepiness 04/26/2022   Muscle weakness 04/26/2022   Arm weakness 04/26/2022   Autoimmune disorder (HCC) 04/26/2022   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 10/07/2021   Premature ovarian failure 09/15/2020   Chronic migraine without aura, with intractable migraine, so stated, with status migrainosus 07/31/2020   Sleeping difficulty 10/15/2015   Migraine with aura and without status migrainosus, not intractable 08/19/2015   Tremor 04/14/2015   Goiter 09/21/2012   Hypothyroidism, acquired, autoimmune    Thyroiditis, autoimmune    Fatigue     PCP: Jarold Motto, PA  REFERRING  PROVIDER: Teryl Lucy, MD  REFERRING DIAG: M75.100 (ICD-10-CM) - Tear of rotator cuff, unspecified laterality, unspecified tear extent, unspecified whether traumatic  THERAPY DIAG:  Acute pain of right shoulder  Muscle weakness (generalized)  Rationale for Evaluation and Treatment: Rehabilitation  ONSET DATE: March 2023  SUBJECTIVE:                                                                                                                                                                                      SUBJECTIVE STATEMENT:  01/12/2024   Feeling OK, woke up with a big knot in the back of my right shoulder blade but otherwise doing good   Eval: States in March she was doing swimmers presses and hurt both of her shoulders and thought it would go away. States the left arm got better but right arm never did. States that right before thanksgiving she was reaching towards her shopping cart that was rolling away from her and had increased pain.  Reports she has had increased pain since that incident in her right shoulder.  Reports having when she tries to do a little bit of yoga but her right shoulder is very tight and she cannot do all of the things she needs to do.  Reports occasional numbness and tingling in the right arm but it does not last.  Reports dressing can also be painful and sometimes she needs her husband to assist with donning and doffing sweaters.  Reports she also cannot lay comfortably on her right side.    Hand dominance: Right  PERTINENT HISTORY: Migraines, Fatigue  PAIN:  Are you having pain? Yes: NPRS scale: 6/10 Pain location: knot right posterior shoulder blade  Pain description: tight aching throb  Aggravating factors: nothing  Relieving factors: nothing   PRECAUTIONS: None  RED FLAGS: None   WEIGHT BEARING RESTRICTIONS: No  FALLS:  Has patient fallen in last 6 months? 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  DIAGNOSTIC FINDINGS:  MRI 11/24/23 IMPRESSION: 1. Mild supraspinatus, infraspinatus and subscapularis tendinosis without a tear.     PATIENT SURVEYS:  FOTO 51%   COGNITION: Overall cognitive status: Within functional limits for tasks assessed     SENSATION: Not tested  POSTURE: Right shoulder anterior compared to left arm held in internal rotation and adduction on right    UE Measurements Upper Extremity Right 1/21 Left 1/21   A/PROM MMT A/PROM MMT  Shoulder Flexion 124  WFL   Shoulder Extension      Shoulder Abduction 140  WFL    Shoulder Adduction      Shoulder Internal Rotation/at 30 degrees abd Reaches to right PSIS*/ 45*  Reaches to T2SP/80   Shoulder External Rotation/ at 45 degrees abd Reaches to to T1 SP /30*  Reaches to T4 SP/90   Elbow Flexion      Elbow Extension      Wrist Flexion      Wrist Extension      Wrist Supination      Wrist Pronation      Wrist Ulnar Deviation      Wrist Radial Deviation      Grip Strength NA 85# NA 80#    (Blank rows = not tested)   * pain   JOINT MOBILITY TESTING:  Hypomobility noted in right shoulder in all directions, muscle guarding noted in all range of motion in right shoulder  PALPATION:  Increased tenderness to palpation in right: infraspinatus , pec minor and biceps tendon  TREATMENT DATE:  01/12/2024  Therapeutic Exercise:  Aerobic:  Supine:   Prone:   Seated:  pulleys for shoulder ROM/stretching flexion 15x5 second holds, pulleys for shoulder scaption 15x5 second holds   S/l:  Standing: UBE x3 min forward/3 min backward, 3 way reaches on wall red TB 2x5 B,   Quad: cat/cow x12  Neuromuscular  Re-education: Manual Therapy: ER/IR stretching at different degrees of ABD while on MHP t-spine, then percussion gun to R thoracic paraspinals and R anterior shoulder  Therapeutic Activity: Self Care: Trigger Point Dry Needling:  Modalities:       Previous:   Therapeutic Exercise:  Aerobic:  Supine:  shoulder ER/IR stretching in different degrees of ABD  Prone: prone shoulder extension R 1# x12, prone T R UE 0# x10, prone scap retractions 1# B x12   Seated:  thoracic flexion/extensions x12, pulleys for shoulder flexion 10x5 second holds    S/l:  Standing: biceps/pec stretch on wall R UE 2x30 seconds    Quad:  Neuromuscular Re-education: Manual Therapy: percussion gun with towel to R shoulder (anterior and lateral delts) Therapeutic Activity: Self Care: Trigger Point Dry Needling:  Modalities:        Previous:   Therapeutic Exercise:  Aerobic: UBE 2 minutes fwd and 2 minutes backwards Supine:  PROM of right shoulder in ER/Ir at different degrees of shoulder abd Prone:scap retraction x20 5" holds B, shoulder extension x20 5" holds B , mini snow angel with scap retraction 2x5 5" holds B  Seated:  pulleys flexion 2 minutes, pulleys scaption 2 minutes, self inferior traction in 1 minute x2 rounds  S/l:  Standing:   shoulder extension 2x10 5" holds with towel, shoulder IR with towel behind back x10 10" holds R Quad: cat cow x15 5" holds Each position  Neuromuscular Re-education: Manual Therapy: Percussion gun with towel for vibration to right shoulder 8 minutes, long axis traction to right arm, grade II/III AP to right GHJ Therapeutic Activity: Self Care: Trigger Point Dry Needling:  Modalities:    PATIENT EDUCATION:  Education details: on HEP Person educated: Patient Education method: Programmer, multimedia, Demonstration, and Handouts Education comprehension: verbalized understanding   HOME EXERCISE PROGRAM: [id number]  ASSESSMENT:  CLINICAL IMPRESSION: 01/12/2024    Pt arrives today doing OK, continued to spend some time working on shoulder ROM and peri-scap strength as tolerated, otherwise continued with percussion gun today as she has been benefitting greatly from this. Tolerated all interventions well.   Eval: Patient presents to physical therapy with complaints of chronic right shoulder pain.  Patient presents with limitations in range of motion, strength and overall functional mobility.  Patient tolerated use of vibration intervention to right shoulder significantly well demonstrating improved range of motion in right shoulder and less muscle guarding.  Educated patient in safe use of percussion gun for vibration as well as importance of mobility exercises.  Patient demonstrated at least 40 degrees of right shoulder external rotation after use of percussion gun.  Overall patient would greatly benefit from skilled physical therapy to improve physical impairments and improve overall function and quality of life.  OBJECTIVE IMPAIRMENTS: decreased activity tolerance, decreased ROM, decreased strength, impaired UE functional use, postural dysfunction, and pain.   ACTIVITY LIMITATIONS: carrying, lifting, dressing, and reach over head  PARTICIPATION LIMITATIONS: meal prep, cleaning, driving, shopping, and occupation  PERSONAL FACTORS: Time since onset of injury/illness/exacerbation are also affecting patient's functional outcome.   REHAB POTENTIAL: Good  CLINICAL DECISION MAKING: Stable/uncomplicated  EVALUATION COMPLEXITY: Low  GOALS: Goals reviewed with patient? yes  SHORT TERM GOALS: Target date: 02/01/2024  Patient will be independent in self management strategies to improve quality of life and functional outcomes. Baseline: New Program Goal status: MET  2.  Patient will report at least 50% improvement in overall symptoms and/or function to demonstrate improved functional mobility Baseline: 0% better Goal status: MET  3.  Patient will be able  to don and doff shirts and sweaters independently to improve ability to dress herself. Baseline: Unable requires husband assist Goal status: PROGRESSING      LONG TERM GOALS: Target date: 03/14/2024   Patient will report at least 75% improvement in overall symptoms and/or function to demonstrate improved functional mobility Baseline: 0% better Goal status: PROGRESSING  2.  Patient will improve score on FOTO outcomes measure to projected score to demonstrate overall improved function and QOL Baseline: see above Goal status: PROGRESSING  3.  Patient will be able to demonstrate at least 120 degrees of right shoulder flexion and abduction to improve overhead reach Baseline: Unable Goal status: MET  4.  Patient will demonstrate at least 60 degrees of right shoulder external rotation with arm at 45 degrees of abduction to demonstrate improved shoulder mobility. Baseline: Unable see above Goal status: PROGRESSING   PLAN:  PT FREQUENCY: 1-2x/week of 16 visits over 12 weeks certification.  PT DURATION: 12 weeks  PLANNED INTERVENTIONS: 97110-Therapeutic exercises, 97530- Therapeutic activity, [id number]- Neuromuscular re-education, [phone]- Self Care, 78469- Manual therapy, [phone]- Gait training, [phone]- Orthotic Fit/training, [phone]- Canalith repositioning, [phone]- Aquatic Therapy, 97014- Electrical stimulation (unattended), [phone]- Ionotophoresis 4mg /ml Dexamethasone, Patient/Family education, Balance training, Stair training, Taping, Dry Needling, Joint mobilization, Joint manipulation, Spinal manipulation, Spinal mobilization, Cryotherapy, and Moist heat   PLAN FOR NEXT SESSION: Percussion gun for pain/mobility as needed, range of motion exercises (shoulder ER and IR most limited), progress strengthening as able, trial dry needling if indicated, might benefit from MET for ER/IR ROM    Nedra Hai, PT, DPT 01/12/24 4:04 PM

## 2024-01-16 ENCOUNTER — Other Ambulatory Visit: Payer: Self-pay | Admitting: Physician Assistant

## 2024-01-16 DIAGNOSIS — R928 Other abnormal and inconclusive findings on diagnostic imaging of breast: Secondary | ICD-10-CM

## 2024-01-16 DIAGNOSIS — M7501 Adhesive capsulitis of right shoulder: Secondary | ICD-10-CM | POA: Diagnosis not present

## 2024-01-17 ENCOUNTER — Ambulatory Visit: Payer: BC Managed Care – PPO | Admitting: Physical Therapy

## 2024-01-17 ENCOUNTER — Encounter: Payer: Self-pay | Admitting: Physical Therapy

## 2024-01-17 DIAGNOSIS — M25511 Pain in right shoulder: Secondary | ICD-10-CM

## 2024-01-17 DIAGNOSIS — M6281 Muscle weakness (generalized): Secondary | ICD-10-CM

## 2024-01-17 NOTE — Therapy (Signed)
 OUTPATIENT PHYSICAL THERAPY SHOULDER TREATMENT   Patient Name: Nancy [name] MRN: [id number] DOB:22-Jul-1981, 43 y.o., female Today's Date: 01/17/2024  END OF SESSION:  PT End of Session - 01/17/24 1603     Visit Number 7    Number of Visits 16    Date for PT Re-Evaluation 03/14/24    Authorization Type BCBS 30 VL- AUTH  approved 7 visits 12/26/23-02/23/24 (not including evaluation), 4 additional visits approved from 01/23/24 to 02/21/24    Authorization - Visit Number 6    Authorization - Number of Visits 7    Progress Note Due on Visit 10    PT Start Time 1603    PT Stop Time 1643    PT Time Calculation (min) 40 min    Activity Tolerance Patient tolerated treatment well    Behavior During Therapy [facility] for tasks assessed/performed                 Past Medical History:  Diagnosis Date   Allergy    COVID    Eczema    Fatigue    Hashimoto's disease    Hypothyroidism, acquired, autoimmune    Migraines    Thyroiditis, autoimmune    Hashimotos    Vaginal delivery 2006, 2009   Past Surgical History:  Procedure Laterality Date   MOLE REMOVAL  12/13/2008   tongue growth  12/13/2008   WISDOM TOOTH EXTRACTION  12/13/1997   Patient Active Problem List   Diagnosis Date Noted   Palpitations 09/09/2022   Excessive daytime sleepiness 04/26/2022   Muscle weakness 04/26/2022   Arm weakness 04/26/2022   Autoimmune disorder (HCC) 04/26/2022   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 10/07/2021   Premature ovarian failure 09/15/2020   Chronic migraine without aura, with intractable migraine, so stated, with status migrainosus 07/31/2020   Sleeping difficulty 10/15/2015   Migraine with aura and without status migrainosus, not intractable 08/19/2015   Tremor 04/14/2015   Goiter 09/21/2012   Hypothyroidism, acquired, autoimmune    Thyroiditis, autoimmune     Fatigue     PCP: Job Lukes, PA  REFERRING PROVIDER: Josefina Chew, MD  REFERRING DIAG: M75.100 (ICD-10-CM) - Tear of rotator cuff, unspecified laterality, unspecified tear extent, unspecified whether traumatic  THERAPY DIAG:  Acute pain of right shoulder  Muscle weakness (generalized)  Rationale for Evaluation and Treatment: Rehabilitation  ONSET DATE: March 2023  SUBJECTIVE:  SUBJECTIVE STATEMENT: 01/17/2024 Reaching behind and to the side still difficult. Reports since the start of PT she feels 75-80% better since the start of PT.   Eval: States in March she was doing swimmers presses and hurt both of her shoulders and thought it would go away. States the left arm got better but right arm never did. States that right before thanksgiving she was reaching towards her shopping cart that was rolling away from her and had increased pain.  Reports she has had increased pain since that incident in her right shoulder.  Reports having when she tries to do a little bit of yoga but her right shoulder is very tight and she cannot do all of the things she needs to do.  Reports occasional numbness and tingling in the right arm but it does not last.  Reports dressing can also be painful and sometimes she needs her husband to assist with donning and doffing sweaters.  Reports she also cannot lay comfortably on her right side.    Hand dominance: Right  PERTINENT HISTORY: Migraines, Fatigue  PAIN:  Are you having pain? Yes: NPRS scale: 3/10 Pain location: knot right posterior shoulder blade  Pain description: stiffness   Aggravating factors: nothing  Relieving factors: nothing   PRECAUTIONS: None  RED FLAGS: None   WEIGHT BEARING RESTRICTIONS: No  FALLS:  Has patient fallen in last 6 months?  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  DIAGNOSTIC FINDINGS:  MRI 11/24/23 IMPRESSION: 1. Mild supraspinatus, infraspinatus and subscapularis tendinosis without a tear.     PATIENT SURVEYS:  FOTO 51%  2/14 64% with predicted of 68%   COGNITION: Overall cognitive status: Within functional limits for tasks assessed     SENSATION: Not tested  POSTURE: Right shoulder anterior compared to left arm held in internal rotation and adduction on right    UE Measurements Upper Extremity Right 2/4 Left 2/4   A/PROM MMT A/PROM MMT  Shoulder Flexion 140  WFL   Shoulder Extension      Shoulder Abduction 155  WFL    Shoulder Adduction      Shoulder Internal Rotation/at 30 degrees abd Reaches to L5 SP/ 58*  Reaches to T4 SP/80   Shoulder External Rotation/ at 45 degrees abd Reaches to to T3 SP /42*  Reaches to T4 SP/90   Elbow Flexion      Elbow Extension      Wrist Flexion      Wrist Extension      Wrist Supination      Wrist Pronation      Wrist Ulnar Deviation      Wrist Radial Deviation      Grip Strength NA  NA     (Blank rows = not tested)   * pain   JOINT MOBILITY TESTING:  Hypomobility noted in right shoulder in all directions, muscle guarding noted in all range of motion in right shoulder  PALPATION:  Increased tenderness to palpation in right: infraspinatus , pec minor and biceps tendon  TREATMENT DATE:  01/17/2024  Therapeutic Exercise:  Objective measures updated quad: thread the needle x5 10 holds in both positions and bilateral   Supine:   Prone:   Seated:  pulleys for shoulder ROM/stretching flexion 2 minutes for flexion and 2 minutes for  abd  S/l:  Standing:   Quad:   Neuromuscular Re-education: Manual Therapy: STM to right anterior shoulder muscles - pec- subclavius, ant deltoid - tolerated well  Therapeutic Activity:- to improve donning bra on and washing back - sleeper stretch x10 20 hlds R, prone reaching behind back x10 5 holds, standing with dowel horizontal add behind back x10 10 holds R Self Care: Trigger Point Dry Needling:  Modalities:      PATIENT EDUCATION:  Education details: on HEP Person educated: Patient Education method: Programmer, Multimedia, Facilities Manager, and Handouts Education comprehension: verbalized understanding   HOME EXERCISE PROGRAM: [id number]  ASSESSMENT:  CLINICAL IMPRESSION: 01/17/2024 Updated objective measures and patient is improving in all degrees of motion.  Reaching behind the back and out to the side continue to be most limited but shoulder flexion and abduction has improved.  Progressed exercises for home exercise program and updated goals.  Will continue with current plan of care as tolerated.  Patient will continue to benefit from PT to improve donning and doffing of close as well as daily functional tasks at home and in the community.  Eval: Patient presents to physical therapy with complaints of chronic right shoulder pain.  Patient presents with limitations in range of motion, strength and overall functional mobility.  Patient tolerated use of vibration intervention to right shoulder significantly well demonstrating improved range of motion in right shoulder and less muscle guarding.  Educated patient in safe use of percussion gun for vibration as well as importance of mobility exercises.  Patient demonstrated at least 40 degrees of right shoulder external rotation after use of percussion gun.  Overall patient would greatly benefit from skilled physical therapy to improve physical impairments and improve overall function and quality of life.  OBJECTIVE IMPAIRMENTS: decreased activity  tolerance, decreased ROM, decreased strength, impaired UE functional use, postural dysfunction, and pain.   ACTIVITY LIMITATIONS: carrying, lifting, dressing, and reach over head  PARTICIPATION LIMITATIONS: meal prep, cleaning, driving, shopping, and occupation  PERSONAL FACTORS: Time since onset of injury/illness/exacerbation are also affecting patient's functional outcome.   REHAB POTENTIAL: Good  CLINICAL DECISION MAKING: Stable/uncomplicated  EVALUATION COMPLEXITY: Low   GOALS: Goals reviewed with patient? yes  SHORT TERM GOALS: Target date: 02/01/2024  Patient will be independent in self management strategies to improve quality of life and functional outcomes. Baseline: New Program Goal status: MET  2.  Patient will report at least 50% improvement in overall symptoms and/or function to demonstrate improved functional mobility Baseline: 0% better Goal status: MET  3.  Patient will be able to don and doff shirts and sweaters independently to improve ability to dress herself. Baseline: Unable requires husband assist Goal status: PROGRESSING      LONG TERM GOALS: Target date: 03/14/2024   Patient will report at least 75% improvement in overall symptoms and/or function to demonstrate improved functional mobility Baseline: 0% better Goal status: MET  2.  Patient will improve score on FOTO outcomes measure to projected score to demonstrate overall improved function and QOL Baseline: see above Goal status: PROGRESSING  3.  Patient will be able to demonstrate at least 120 degrees of right shoulder flexion and abduction to improve overhead reach Baseline: Unable Goal status: MET  4.  Patient will demonstrate at least 60 degrees of right shoulder external rotation with arm at 45 degrees of abduction to demonstrate improved shoulder mobility. Baseline: Unable see above Goal status: PROGRESSING   PLAN:  PT FREQUENCY: 1-2x/week of 16 visits over 12 weeks  certification.  PT DURATION: 12 weeks  PLANNED INTERVENTIONS: 97110-Therapeutic exercises, 97530- Therapeutic activity, [id number]- Neuromuscular re-education, [phone]- Self Care, [id number]- Manual therapy, [phone]- Gait training, [phone]- Orthotic Fit/training, [phone]- Canalith repositioning, [phone]- Aquatic Therapy, 97014- Electrical stimulation (unattended), [phone]- Ionotophoresis 4mg /ml Dexamethasone, Patient/Family education, Balance training, Stair training, Taping, Dry Needling, Joint mobilization, Joint manipulation, Spinal manipulation, Spinal mobilization, Cryotherapy, and Moist heat   PLAN FOR NEXT SESSION: Percussion gun for pain/mobility as needed, range of motion exercises (shoulder ER and IR most limited), progress strengthening as able, trial dry needling if indicated, might benefit from MET for ER/IR ROM    4:56 PM, 01/17/24 Nancy [name], DPT Physical Therapy with [place]

## 2024-01-19 ENCOUNTER — Ambulatory Visit: Payer: BC Managed Care – PPO | Admitting: Physical Therapy

## 2024-01-19 ENCOUNTER — Encounter: Payer: Self-pay | Admitting: Physical Therapy

## 2024-01-19 DIAGNOSIS — M6281 Muscle weakness (generalized): Secondary | ICD-10-CM

## 2024-01-19 DIAGNOSIS — M25511 Pain in right shoulder: Secondary | ICD-10-CM | POA: Diagnosis not present

## 2024-01-19 NOTE — Therapy (Signed)
 OUTPATIENT PHYSICAL THERAPY SHOULDER TREATMENT   Patient Name: Nancy [name] MRN: [id number] DOB:19-Nov-1981, 43 y.o., female Today's Date: 01/19/2024  END OF SESSION:  PT End of Session - 01/19/24 1458     Visit Number 8    Number of Visits 16    Date for PT Re-Evaluation 03/14/24    Authorization Type BCBS 30 VL- AUTH  approved 7 visits 12/26/23-02/23/24 (not including evaluation), 4 additional visits approved from 01/23/24 to 02/21/24    Authorization - Visit Number 7    Authorization - Number of Visits 7    Progress Note Due on Visit 10    PT Start Time 1513    PT Stop Time 1552    PT Time Calculation (min) 39 min    Activity Tolerance Patient tolerated treatment well    Behavior During Therapy [facility] for tasks assessed/performed                  Past Medical History:  Diagnosis Date   Allergy    COVID    Eczema    Fatigue    Hashimoto's disease    Hypothyroidism, acquired, autoimmune    Migraines    Thyroiditis, autoimmune    Hashimotos    Vaginal delivery 2006, 2009   Past Surgical History:  Procedure Laterality Date   MOLE REMOVAL  12/13/2008   tongue growth  12/13/2008   WISDOM TOOTH EXTRACTION  12/13/1997   Patient Active Problem List   Diagnosis Date Noted   Palpitations 09/09/2022   Excessive daytime sleepiness 04/26/2022   Muscle weakness 04/26/2022   Arm weakness 04/26/2022   Autoimmune disorder (HCC) 04/26/2022   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 10/07/2021   Premature ovarian failure 09/15/2020   Chronic migraine without aura, with intractable migraine, so stated, with status migrainosus 07/31/2020   Sleeping difficulty 10/15/2015   Migraine with aura and without status migrainosus, not intractable 08/19/2015   Tremor 04/14/2015   Goiter 09/21/2012   Hypothyroidism, acquired, autoimmune    Thyroiditis, autoimmune     Fatigue     PCP: Job Lukes, PA  REFERRING PROVIDER: Josefina Chew, MD  REFERRING DIAG: M75.100 (ICD-10-CM) - Tear of rotator cuff, unspecified laterality, unspecified tear extent, unspecified whether traumatic  THERAPY DIAG:  Acute pain of right shoulder  Muscle weakness (generalized)  Rationale for Evaluation and Treatment: Rehabilitation  ONSET DATE: March 2023  SUBJECTIVE:  SUBJECTIVE STATEMENT: 01/19/2024 States that her shoulder feels better.   Eval: States in March she was doing swimmers presses and hurt both of her shoulders and thought it would go away. States the left arm got better but right arm never did. States that right before thanksgiving she was reaching towards her shopping cart that was rolling away from her and had increased pain.  Reports she has had increased pain since that incident in her right shoulder.  Reports having when she tries to do a little bit of yoga but her right shoulder is very tight and she cannot do all of the things she needs to do.  Reports occasional numbness and tingling in the right arm but it does not last.  Reports dressing can also be painful and sometimes she needs her husband to assist with donning and doffing sweaters.  Reports she also cannot lay comfortably on her right side.    Hand dominance: Right  PERTINENT HISTORY: Migraines, Fatigue  PAIN:  Are you having pain? Yes: NPRS scale: 2/10 Pain location: knot right posterior shoulder blade  Pain description: stiffness   Aggravating factors: nothing  Relieving factors: nothing   PRECAUTIONS: None  RED FLAGS: None   WEIGHT BEARING RESTRICTIONS: No  FALLS:  Has patient fallen in last 6 months? 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  DIAGNOSTIC FINDINGS:  MRI 11/24/23 IMPRESSION: 1. Mild supraspinatus, infraspinatus and subscapularis tendinosis without a tear.     PATIENT SURVEYS:  FOTO 51%  2/14 64% with predicted of 68%   COGNITION: Overall cognitive status: Within functional limits for tasks assessed     SENSATION: Not tested  POSTURE: Right shoulder anterior compared to left arm held in internal rotation and adduction on right    UE Measurements Upper Extremity Right 2/4 Left 2/4   A/PROM MMT A/PROM MMT  Shoulder Flexion 140  WFL   Shoulder Extension      Shoulder Abduction 155  WFL    Shoulder Adduction      Shoulder Internal Rotation/at 30 degrees abd Reaches to L5 SP/ 58*  Reaches to T4 SP/80   Shoulder External Rotation/ at 45 degrees abd Reaches to to T3 SP /42*  Reaches to T4 SP/90   Elbow Flexion      Elbow Extension      Wrist Flexion      Wrist Extension      Wrist Supination      Wrist Pronation      Wrist Ulnar Deviation      Wrist Radial Deviation      Grip Strength NA  NA     (Blank rows = not tested)   * pain   JOINT MOBILITY TESTING:  Hypomobility noted in right shoulder in all directions, muscle guarding noted in all range of motion in right shoulder  PALPATION:  Increased tenderness to palpation in right: infraspinatus , pec minor and biceps tendon  TREATMENT DATE:  01/19/2024  Therapeutic Exercise:    quad: thread the needle x5 10 holds in both positions and bilateral   Supine:   Prone:   Seated:  pulleys for shoulder ROM/stretching flexion 2 minutes for flexion and 2 minutes for abd  S/l:  Standing:  blue band shoulder extension 2x12 5 holds, shoulder row blue band 2x12 5 holds,  shoulder abd horizontal R 2x10 red band, shoulder IR/ER with ball behind elbow - 5 minutes total B Quad:   Neuromuscular Re-education: Manual Therapy:  STM to posterior right shoulder, glenohumeral mobilizations in inferior and posterior direction grade II/III - R tolerated well, scapular mobilizations in all directions R - tolerated well  Therapeutic Activity:  Self Care: Trigger Point Dry Needling:  Modalities:      PATIENT EDUCATION:  Education details: on HEP Person educated: Patient Education method: Explanation, Demonstration, and Handouts Education comprehension: verbalized understanding   HOME EXERCISE PROGRAM: [id number]  ASSESSMENT:  CLINICAL IMPRESSION: 01/19/2024 Focused on strengthening and mobility. Overall patient doing well and making progress in ROM and functional arm strength. Added new exercises to HEP and provided patient with blue theraband for HEP adherence. Will conitnue with current POC as tolerated.   Eval: Patient presents to physical therapy with complaints of chronic right shoulder pain.  Patient presents with limitations in range of motion, strength and overall functional mobility.  Patient tolerated use of vibration intervention to right shoulder significantly well demonstrating improved range of motion in right shoulder and less muscle guarding.  Educated patient in safe use of percussion gun for vibration as well as importance of mobility exercises.  Patient demonstrated at least 40 degrees of right shoulder external rotation after use of percussion gun.  Overall patient would greatly benefit from skilled physical therapy to improve physical impairments and improve overall function and quality of life.  OBJECTIVE IMPAIRMENTS: decreased activity tolerance, decreased ROM, decreased strength, impaired UE functional use, postural dysfunction, and pain.   ACTIVITY LIMITATIONS: carrying, lifting, dressing, and reach over head  PARTICIPATION LIMITATIONS: meal  prep, cleaning, driving, shopping, and occupation  PERSONAL FACTORS: Time since onset of injury/illness/exacerbation are also affecting patient's functional outcome.   REHAB POTENTIAL: Good  CLINICAL DECISION MAKING: Stable/uncomplicated  EVALUATION COMPLEXITY: Low   GOALS: Goals reviewed with patient? yes  SHORT TERM GOALS: Target date: 02/01/2024  Patient will be independent in self management strategies to improve quality of life and functional outcomes. Baseline: New Program Goal status: MET  2.  Patient will report at least 50% improvement in overall symptoms and/or function to demonstrate improved functional mobility Baseline: 0% better Goal status: MET  3.  Patient will be able to don and doff shirts and sweaters independently to improve ability to dress herself. Baseline: Unable requires husband assist Goal status: PROGRESSING      LONG TERM GOALS: Target date: 03/14/2024   Patient will report at least 75% improvement in overall symptoms and/or function to demonstrate improved functional mobility Baseline: 0% better Goal status: MET  2.  Patient will improve score on FOTO outcomes measure to projected score to demonstrate overall improved function and QOL Baseline: see above Goal status: PROGRESSING  3.  Patient will be able to demonstrate at least 120 degrees of right shoulder flexion and abduction to improve overhead reach Baseline: Unable Goal status: MET  4.  Patient will demonstrate at least 60 degrees of right shoulder external rotation with arm at 45 degrees of abduction to demonstrate improved shoulder mobility. Baseline: Unable see above  Goal status: PROGRESSING   PLAN:  PT FREQUENCY: 1-2x/week of 16 visits over 12 weeks certification.  PT DURATION: 12 weeks  PLANNED INTERVENTIONS: 97110-Therapeutic exercises, 97530- Therapeutic activity, 97112- Neuromuscular re-education, [phone]- Self Care, 02859- Manual therapy, [phone]- Gait training, [phone]-  Orthotic Fit/training, [phone]- Canalith repositioning, [phone]- Aquatic Therapy, 97014- Electrical stimulation (unattended), [phone]- Ionotophoresis 4mg /ml Dexamethasone, Patient/Family education, Balance training, Stair training, Taping, Dry Needling, Joint mobilization, Joint manipulation, Spinal manipulation, Spinal mobilization, Cryotherapy, and Moist heat   PLAN FOR NEXT SESSION: Percussion gun for pain/mobility as needed, range of motion exercises (shoulder ER and IR most limited), progress strengthening as able, trial dry needling if indicated, might benefit from MET for ER/IR ROM    3:56 PM, 01/19/24 Olivia Church, DPT Physical Therapy with [facility]

## 2024-01-25 ENCOUNTER — Telehealth: Payer: Self-pay | Admitting: Adult Health

## 2024-01-25 DIAGNOSIS — G43009 Migraine without aura, not intractable, without status migrainosus: Secondary | ICD-10-CM

## 2024-01-25 DIAGNOSIS — G43711 Chronic migraine without aura, intractable, with status migrainosus: Secondary | ICD-10-CM

## 2024-01-25 NOTE — Telephone Encounter (Signed)
Completed BCBS PA renewal form and placed in nurse pod for NP signature.

## 2024-01-25 NOTE — Telephone Encounter (Signed)
Faxed signed PA form with OV notes to [facility]. ?

## 2024-01-26 ENCOUNTER — Ambulatory Visit
Admission: RE | Admit: 2024-01-26 | Discharge: 2024-01-26 | Disposition: A | Discharge status: Home / Self Care | Payer: BC Managed Care – PPO | Source: Ambulatory Visit | Attending: Physician Assistant

## 2024-01-26 ENCOUNTER — Other Ambulatory Visit: Payer: Self-pay | Admitting: Physician Assistant

## 2024-01-26 DIAGNOSIS — R928 Other abnormal and inconclusive findings on diagnostic imaging of breast: Secondary | ICD-10-CM

## 2024-01-26 DIAGNOSIS — N6315 Unspecified lump in the right breast, overlapping quadrants: Secondary | ICD-10-CM | POA: Diagnosis not present

## 2024-01-26 DIAGNOSIS — N6310 Unspecified lump in the right breast, unspecified quadrant: Secondary | ICD-10-CM

## 2024-01-30 ENCOUNTER — Ambulatory Visit
Admission: RE | Admit: 2024-01-30 | Discharge: 2024-01-30 | Disposition: A | Discharge status: Home / Self Care | Payer: BC Managed Care – PPO | Source: Ambulatory Visit | Attending: Physician Assistant

## 2024-01-30 DIAGNOSIS — N6310 Unspecified lump in the right breast, unspecified quadrant: Secondary | ICD-10-CM

## 2024-01-30 DIAGNOSIS — N6315 Unspecified lump in the right breast, overlapping quadrants: Secondary | ICD-10-CM | POA: Diagnosis not present

## 2024-01-30 DIAGNOSIS — N6011 Diffuse cystic mastopathy of right breast: Secondary | ICD-10-CM | POA: Diagnosis not present

## 2024-01-30 HISTORY — PX: BREAST BIOPSY: SHX20

## 2024-01-30 NOTE — Telephone Encounter (Signed)
 Auth#: [id number] (01/25/24-12/26/24)

## 2024-01-31 LAB — SURGICAL PATHOLOGY

## 2024-02-02 ENCOUNTER — Encounter: Payer: BC Managed Care – PPO | Admitting: Physical Therapy

## 2024-02-07 ENCOUNTER — Encounter: Payer: Self-pay | Admitting: Physical Therapy

## 2024-02-07 ENCOUNTER — Ambulatory Visit: Payer: BC Managed Care – PPO | Admitting: Physical Therapy

## 2024-02-07 DIAGNOSIS — M25511 Pain in right shoulder: Secondary | ICD-10-CM | POA: Diagnosis not present

## 2024-02-07 DIAGNOSIS — F411 Generalized anxiety disorder: Secondary | ICD-10-CM | POA: Diagnosis not present

## 2024-02-07 DIAGNOSIS — M6281 Muscle weakness (generalized): Secondary | ICD-10-CM | POA: Diagnosis not present

## 2024-02-07 NOTE — Therapy (Signed)
 OUTPATIENT PHYSICAL THERAPY SHOULDER TREATMENT   Patient Name: Nancy [name] MRN: [id number] DOB:[birth year], 43 y.o., female Today's Date: 02/07/2024  END OF SESSION:  PT End of Session - 02/07/24 1602     Visit Number 9    Number of Visits 16    Date for PT Re-Evaluation 03/14/24    Authorization Type BCBS 30 VL- AUTH  approved 7 visits 12/26/23-02/23/24 (not including evaluation), 4 additional visits approved from 01/23/24 to 02/21/24    Authorization - Visit Number 1    Authorization - Number of Visits 4    Progress Note Due on Visit 10    PT Start Time 1602    PT Stop Time 1640    PT Time Calculation (min) 38 min    Activity Tolerance Patient tolerated treatment well    Behavior During Therapy [facility] for tasks assessed/performed                  Past Medical History:  Diagnosis Date   Allergy    COVID    Eczema    Fatigue    Hashimoto's disease    Hypothyroidism, acquired, autoimmune    Migraines    Thyroiditis, autoimmune    Hashimotos    Vaginal delivery 2006, 2009   Past Surgical History:  Procedure Laterality Date   BREAST BIOPSY Right 01/30/2024   [place] RT BREAST BX W LOC DEV 1ST LESION IMG BX SPEC US GUIDE 01/30/2024 [facility]   MOLE REMOVAL  12/13/2008   tongue growth  12/13/2008   WISDOM TOOTH EXTRACTION  12/13/1997   Patient Active Problem List   Diagnosis Date Noted   Palpitations 09/09/2022   Excessive daytime sleepiness 04/26/2022   Muscle weakness 04/26/2022   Arm weakness 04/26/2022   Autoimmune disorder (HCC) 04/26/2022   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 10/07/2021   Premature ovarian failure 09/15/2020   Chronic migraine without aura, with intractable migraine, so stated, with status migrainosus 07/31/2020   Sleeping difficulty 10/15/2015   Migraine with aura and without status migrainosus, not intractable  08/19/2015   Tremor 04/14/2015   Goiter 09/21/2012   Hypothyroidism, acquired, autoimmune    Thyroiditis, autoimmune    Fatigue     PCP: Jarold Motto, PA  REFERRING PROVIDER: Teryl Lucy, MD  REFERRING DIAG: M75.100 (ICD-10-CM) - Tear of rotator cuff, unspecified laterality, unspecified tear extent, unspecified whether traumatic  THERAPY DIAG:  Acute pain of right shoulder  Muscle weakness (generalized)  Rationale for Evaluation and Treatment: Rehabilitation  ONSET DATE: March 2023  SUBJECTIVE:  SUBJECTIVE STATEMENT: 02/07/2024 States her shoulder has been sore this week. States she had biopsy in right breast. Motion is still good.   Eval: States in March she was doing swimmers presses and hurt both of her shoulders and thought it would go away. States the left arm got better but right arm never did. States that right before thanksgiving she was reaching towards her shopping cart that was rolling away from her and had increased pain.  Reports she has had increased pain since that incident in her right shoulder.  Reports having when she tries to do a little bit of yoga but her right shoulder is very tight and she cannot do all of the things she needs to do.  Reports occasional numbness and tingling in the right arm but it does not last.  Reports dressing can also be painful and sometimes she needs her husband to assist with donning and doffing sweaters.  Reports she also cannot lay comfortably on her right side.    Hand dominance: Right  PERTINENT HISTORY: Migraines, Fatigue  PAIN:  Are you having pain? Yes: NPRS scale: 5/10 Pain location: right middle arm Pain description: soreness    Aggravating factors: nothing  Relieving factors: nothing   PRECAUTIONS: None  RED  FLAGS: None   WEIGHT BEARING RESTRICTIONS: No  FALLS:  Has patient fallen in last 6 months? 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  DIAGNOSTIC FINDINGS:  MRI 11/24/23 IMPRESSION: 1. Mild supraspinatus, infraspinatus and subscapularis tendinosis without a tear.     PATIENT SURVEYS:  FOTO 51%  2/14 64% with predicted of 68%   COGNITION: Overall cognitive status: Within functional limits for tasks assessed     SENSATION: Not tested  POSTURE: Right shoulder anterior compared to left arm held in internal rotation and adduction on right    UE Measurements Upper Extremity Right 2/4 Left 2/4   A/PROM MMT A/PROM MMT  Shoulder Flexion 140  WFL   Shoulder Extension      Shoulder Abduction 155  WFL    Shoulder Adduction      Shoulder Internal Rotation/at 30 degrees abd Reaches to L5 SP/ 58*  Reaches to T4 SP/80   Shoulder External Rotation/ at 45 degrees abd Reaches to to T3 SP /42*  Reaches to T4 SP/90   Elbow Flexion      Elbow Extension      Wrist Flexion      Wrist Extension      Wrist Supination      Wrist Pronation      Wrist Ulnar Deviation      Wrist Radial Deviation      Grip Strength NA  NA     (Blank rows = not tested)   * pain   JOINT MOBILITY TESTING:  Hypomobility noted in right shoulder in all directions, muscle guarding noted in all range of motion in right shoulder  PALPATION:  Increased tenderness to palpation in right: infraspinatus , pec minor and biceps tendon  TREATMENT DATE:  02/07/2024  Therapeutic Exercise: quad: right UE slides overhead and to the side 2 minutes  Supine:   Prone: T position then with rocking back and  forth 3  minutes total   Seated:    S/l:  Standing:  shoulder extension with towel 2 minutes B, behind back shoulder stretch 2 minutes R, AROM shoulder all directions - x10, pec stretch at wall hands behind head x5 5" holds  Quad:   Neuromuscular Re-education: Manual Therapy:  STM to right deltoid an biceps, AP to right shoulder grade II tolerated well, inferior mobilization to right shoulder in prone and supine grade II/II - tolerated well  Therapeutic Activity:  Self Care: Trigger Point Dry Needling:  Modalities: thermotherapy to right shoulder during manual interventions      PATIENT EDUCATION:  Education details: on HEP Person educated: Patient Education method: Explanation, Demonstration, and Handouts Education comprehension: verbalized understanding   HOME EXERCISE PROGRAM: [id number]  ASSESSMENT:  CLINICAL IMPRESSION: 02/07/2024 Focused on manual interventions secondary to increased soreness from recent biopsy. Tolerated this well. Overall motion still improving but reaching to side most difficult. Added new exercises to HEP. Will continue with current POC as tolerated.   Eval: Patient presents to physical therapy with complaints of chronic right shoulder pain.  Patient presents with limitations in range of motion, strength and overall functional mobility.  Patient tolerated use of vibration intervention to right shoulder significantly well demonstrating improved range of motion in right shoulder and less muscle guarding.  Educated patient in safe use of percussion gun for vibration as well as importance of mobility exercises.  Patient demonstrated at least 40 degrees of right shoulder external rotation after use of percussion gun.  Overall patient would greatly benefit from skilled physical therapy to improve physical impairments and improve overall function and quality of life.  OBJECTIVE IMPAIRMENTS: decreased activity tolerance, decreased ROM, decreased strength, impaired UE  functional use, postural dysfunction, and pain.   ACTIVITY LIMITATIONS: carrying, lifting, dressing, and reach over head  PARTICIPATION LIMITATIONS: meal prep, cleaning, driving, shopping, and occupation  PERSONAL FACTORS: Time since onset of injury/illness/exacerbation are also affecting patient's functional outcome.   REHAB POTENTIAL: Good  CLINICAL DECISION MAKING: Stable/uncomplicated  EVALUATION COMPLEXITY: Low   GOALS: Goals reviewed with patient? yes  SHORT TERM GOALS: Target date: 02/01/2024  Patient will be independent in self management strategies to improve quality of life and functional outcomes. Baseline: New Program Goal status: MET  2.  Patient will report at least 50% improvement in overall symptoms and/or function to demonstrate improved functional mobility Baseline: 0% better Goal status: MET  3.  Patient will be able to don and doff shirts and sweaters independently to improve ability to dress herself. Baseline: Unable requires husband assist Goal status: PROGRESSING      LONG TERM GOALS: Target date: 03/14/2024   Patient will report at least 75% improvement in overall symptoms and/or function to demonstrate improved functional mobility Baseline: 0% better Goal status: MET  2.  Patient will improve score on FOTO outcomes measure to projected score to demonstrate overall improved function and QOL Baseline: see above Goal status: PROGRESSING  3.  Patient will be able to demonstrate at least 120 degrees of right shoulder flexion and abduction to improve overhead reach Baseline: Unable Goal status: MET  4.  Patient will demonstrate at least 60 degrees of right shoulder external rotation with arm at 45 degrees of abduction to demonstrate improved shoulder mobility. Baseline: Unable see above Goal status:  PROGRESSING   PLAN:  PT FREQUENCY: 1-2x/week of 16 visits over 12 weeks certification.  PT DURATION: 12 weeks  PLANNED INTERVENTIONS:  97110-Therapeutic exercises, 97530- Therapeutic activity, 97112- Neuromuscular re-education, [phone]- Self Care, 91478- Manual therapy, [phone]- Gait training, [phone]- Orthotic Fit/training, [phone]- Canalith repositioning, [phone]- Aquatic Therapy, 97014- Electrical stimulation (unattended), [phone]- Ionotophoresis 4mg /ml Dexamethasone, Patient/Family education, Balance training, Stair training, Taping, Dry Needling, Joint mobilization, Joint manipulation, Spinal manipulation, Spinal mobilization, Cryotherapy, and Moist heat   PLAN FOR NEXT SESSION: Percussion gun for pain/mobility as needed, range of motion exercises (shoulder ER and IR most limited), progress strengthening as able, trial dry needling if indicated, might benefit from MET for ER/IR ROM    4:41 PM, 02/07/24 Tereasa Coop, DPT Physical Therapy with [facility]

## 2024-02-14 ENCOUNTER — Encounter: Payer: Self-pay | Admitting: Physical Therapy

## 2024-02-14 ENCOUNTER — Ambulatory Visit: Payer: BC Managed Care – PPO | Admitting: Physical Therapy

## 2024-02-14 DIAGNOSIS — M6281 Muscle weakness (generalized): Secondary | ICD-10-CM

## 2024-02-14 DIAGNOSIS — M25511 Pain in right shoulder: Secondary | ICD-10-CM | POA: Diagnosis not present

## 2024-02-14 NOTE — Therapy (Signed)
 OUTPATIENT PHYSICAL THERAPY SHOULDER TREATMENT   Patient Name: Nancy [name] MRN: [id number] DOB:[birth year], 43 y.o., female Today's Date: 02/14/2024  END OF SESSION:  PT End of Session - 02/14/24 1518     Visit Number 10    Number of Visits 16    Date for PT Re-Evaluation 03/14/24    Authorization Type BCBS 30 VL- AUTH  approved 7 visits 12/26/23-02/23/24 (not including evaluation), 4 additional visits approved from 01/23/24 to 02/21/24    Authorization - Visit Number 2    Authorization - Number of Visits 4    Progress Note Due on Visit 10    PT Start Time 1518    PT Stop Time 1557    PT Time Calculation (min) 39 min    Activity Tolerance Patient tolerated treatment well    Behavior During Therapy [facility] for tasks assessed/performed                  Past Medical History:  Diagnosis Date   Allergy    COVID    Eczema    Fatigue    Hashimoto's disease    Hypothyroidism, acquired, autoimmune    Migraines    Thyroiditis, autoimmune    Hashimotos    Vaginal delivery 2006, 2009   Past Surgical History:  Procedure Laterality Date   BREAST BIOPSY Right 01/30/2024   [place] RT BREAST BX W LOC DEV 1ST LESION IMG BX SPEC US GUIDE 01/30/2024 [facility]   MOLE REMOVAL  12/13/2008   tongue growth  12/13/2008   WISDOM TOOTH EXTRACTION  12/13/1997   Patient Active Problem List   Diagnosis Date Noted   Palpitations 09/09/2022   Excessive daytime sleepiness 04/26/2022   Muscle weakness 04/26/2022   Arm weakness 04/26/2022   Autoimmune disorder (HCC) 04/26/2022   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 10/07/2021   Premature ovarian failure 09/15/2020   Chronic migraine without aura, with intractable migraine, so stated, with status migrainosus 07/31/2020   Sleeping difficulty 10/15/2015   Migraine with aura and without status migrainosus, not intractable  08/19/2015   Tremor 04/14/2015   Goiter 09/21/2012   Hypothyroidism, acquired, autoimmune    Thyroiditis, autoimmune    Fatigue     PCP: Jarold Motto, PA  REFERRING PROVIDER: Teryl Lucy, MD  REFERRING DIAG: M75.100 (ICD-10-CM) - Tear of rotator cuff, unspecified laterality, unspecified tear extent, unspecified whether traumatic  THERAPY DIAG:  Acute pain of right shoulder  Muscle weakness (generalized)  Rationale for Evaluation and Treatment: Rehabilitation  ONSET DATE: March 2023  SUBJECTIVE:  SUBJECTIVE STATEMENT: 02/14/2024 States her shoulder has been sore this week. States she had biopsy in right breast. Motion is still good.   Eval: States in March she was doing swimmers presses and hurt both of her shoulders and thought it would go away. States the left arm got better but right arm never did. States that right before thanksgiving she was reaching towards her shopping cart that was rolling away from her and had increased pain.  Reports she has had increased pain since that incident in her right shoulder.  Reports having when she tries to do a little bit of yoga but her right shoulder is very tight and she cannot do all of the things she needs to do.  Reports occasional numbness and tingling in the right arm but it does not last.  Reports dressing can also be painful and sometimes she needs her husband to assist with donning and doffing sweaters.  Reports she also cannot lay comfortably on her right side.    Hand dominance: Right  PERTINENT HISTORY: Migraines, Fatigue  PAIN:  Are you having pain? Yes: NPRS scale: 5/10 Pain location: right middle arm Pain description: soreness    Aggravating factors: nothing  Relieving factors: nothing   PRECAUTIONS: None  RED  FLAGS: None   WEIGHT BEARING RESTRICTIONS: No  FALLS:  Has patient fallen in last 6 months? 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  DIAGNOSTIC FINDINGS:  MRI 11/24/23 IMPRESSION: 1. Mild supraspinatus, infraspinatus and subscapularis tendinosis without a tear.     PATIENT SURVEYS:  FOTO 51%  2/14 & 3/4 64% with predicted of 68%   COGNITION: Overall cognitive status: Within functional limits for tasks assessed     SENSATION: Not tested  POSTURE: Right shoulder anterior compared to left arm held in internal rotation and adduction on right    UE Measurements Upper Extremity Right 3/4 Left 3/4   A/PROM MMT A/PROM MMT  Shoulder Flexion 165 4 WFL   Shoulder Extension      Shoulder Abduction 150 4- WFL    Shoulder Adduction      Shoulder Internal Rotation/at 45 degrees abd Reaches to L3 SP*/ 68* 3+ Reaches to T4 SP/80   Shoulder External Rotation/ at 45 degrees abd Reaches to to T3 SP/ 42* 3+ Reaches to T4 SP/90   Elbow Flexion      Elbow Extension      Wrist Flexion      Wrist Extension      Wrist Supination      Wrist Pronation      Wrist Ulnar Deviation      Wrist Radial Deviation      Grip Strength NA  NA     (Blank rows = not tested)   * pain   JOINT MOBILITY TESTING:  Hypomobility noted in right shoulder in all directions, muscle guarding noted in all range of motion in right shoulder  PALPATION:  Increased tenderness to palpation in right: infraspinatus , pec minor and biceps tendon  TREATMENT DATE:  02/14/2024  Therapeutic Exercise: Objective measurements updated Reviewed entire HEP   Supine:   Prone:    Seated:     S/l:  Standing:  long axis traction at door at 90 abd 3 minutes, shoulder abd 2# weight B 2x8 --> 1x6   Quad:   Neuromuscular Re-education: Manual Therapy:  STM to right deltoid an biceps, AP to right shoulder grade II tolerated well, long axis traction to right UE  Therapeutic Activity: shoulder flexion with 2# weight - back up agaisnt the wall 3x12 B slow and controlled  Self Care: Trigger Point Dry Needling:  Modalities:   Trigger Point Dry Needling  Initial Treatment: Pt instructed on Dry Needling rational, procedures, and possible side effects. Pt instructed to expect mild to moderate muscle soreness later in the day and/or into the next day.  Pt instructed in methods to reduce muscle soreness. Pt instructed to continue prescribed HEP. Patient was educated on signs and symptoms of infection and other risk factors and advised to seek medical attention should they occur.  Patient verbalized understanding of these instructions and education.   Patient Verbal Consent Given: Yes Education Handout Provided: Previously Provided Muscles Treated: right deltoid and biceps Electrical Stimulation Performed: No Treatment Response/Outcome: improved ROM and reduced resting muscle tone     PATIENT EDUCATION:  Education details: on HEP, DN  Person educated: Patient Education method: Programmer, multimedia, Facilities manager, and Handouts Education comprehension: verbalized understanding   HOME EXERCISE PROGRAM: [id number]  ASSESSMENT:  CLINICAL IMPRESSION: 02/14/2024 Added dry needling to HEP. Responded positively with reduced tension in muscles an increased ROM noted afterwards Reviewed HEP and progressed exercises to include UE strengthening to shoulder height. No pain noted. Verbal and tactile cues throughout. Will continue with current POC as tolerated.   Eval: Patient presents to physical therapy with complaints of chronic right shoulder pain.  Patient presents with limitations in range of  motion, strength and overall functional mobility.  Patient tolerated use of vibration intervention to right shoulder significantly well demonstrating improved range of motion in right shoulder and less muscle guarding.  Educated patient in safe use of percussion gun for vibration as well as importance of mobility exercises.  Patient demonstrated at least 40 degrees of right shoulder external rotation after use of percussion gun.  Overall patient would greatly benefit from skilled physical therapy to improve physical impairments and improve overall function and quality of life.  OBJECTIVE IMPAIRMENTS: decreased activity tolerance, decreased ROM, decreased strength, impaired UE functional use, postural dysfunction, and pain.   ACTIVITY LIMITATIONS: carrying, lifting, dressing, and reach over head  PARTICIPATION LIMITATIONS: meal prep, cleaning, driving, shopping, and occupation  PERSONAL FACTORS: Time since onset of injury/illness/exacerbation are also affecting patient's functional outcome.   REHAB POTENTIAL: Good  CLINICAL DECISION MAKING: Stable/uncomplicated  EVALUATION COMPLEXITY: Low   GOALS: Goals reviewed with patient? yes  SHORT TERM GOALS: Target date: 02/01/2024  Patient will be independent in self management strategies to improve quality of life and functional outcomes. Baseline: New Program Goal status: MET  2.  Patient will report at least 50% improvement in overall symptoms and/or function to demonstrate improved functional mobility Baseline: 0% better Goal status: MET  3.  Patient will be able to don and doff shirts and sweaters independently to improve ability to dress herself. Baseline: Unable requires husband assist Goal status: PROGRESSING      LONG TERM GOALS: Target date: 03/14/2024   Patient will report at least 75% improvement in overall symptoms and/or function  to demonstrate improved functional mobility Baseline: 0% better Goal status: MET  2.  Patient  will improve score on FOTO outcomes measure to projected score to demonstrate overall improved function and QOL Baseline: see above Goal status: PROGRESSING  3.  Patient will be able to demonstrate at least 120 degrees of right shoulder flexion and abduction to improve overhead reach Baseline: Unable Goal status: MET  4.  Patient will demonstrate at least 60 degrees of right shoulder external rotation with arm at 45 degrees of abduction to demonstrate improved shoulder mobility. Baseline: Unable see above Goal status: PROGRESSING   PLAN:  PT FREQUENCY: 1-2x/week of 16 visits over 12 weeks certification.  PT DURATION: 12 weeks  PLANNED INTERVENTIONS: 97110-Therapeutic exercises, 97530- Therapeutic activity, [id number]- Neuromuscular re-education, [phone]- Self Care, 10932- Manual therapy, [phone]- Gait training, [phone]- Orthotic Fit/training, [phone]- Canalith repositioning, [id number]- Aquatic Therapy, 97014- Electrical stimulation (unattended), [phone]- Ionotophoresis 4mg /ml Dexamethasone, Patient/Family education, Balance training, Stair training, Taping, Dry Needling, Joint mobilization, Joint manipulation, Spinal manipulation, Spinal mobilization, Cryotherapy, and Moist heat   PLAN FOR NEXT SESSION: progress HEP, anticipate DC pending patient presentation.    4:04 PM, 02/14/24 Tereasa Coop, DPT Physical Therapy with [facility]

## 2024-02-21 ENCOUNTER — Encounter: Payer: Self-pay | Admitting: Physical Therapy

## 2024-02-21 ENCOUNTER — Ambulatory Visit: Payer: BC Managed Care – PPO | Admitting: Physical Therapy

## 2024-02-21 DIAGNOSIS — M6281 Muscle weakness (generalized): Secondary | ICD-10-CM

## 2024-02-21 DIAGNOSIS — M25511 Pain in right shoulder: Secondary | ICD-10-CM | POA: Diagnosis not present

## 2024-02-21 NOTE — Therapy (Signed)
 OUTPATIENT PHYSICAL THERAPY SHOULDER TREATMENT  PHYSICAL THERAPY DISCHARGE SUMMARY  Visits from Start of Care: 11  Current functional level related to goals / functional outcomes: See below   Remaining deficits: ROM/Strength   Education / Equipment: See below   Patient agrees to discharge. Patient goals were partially met. Patient is being discharged due to being pleased with the current functional level.    Patient Name: Nancy [name] MRN: [id number] DOB:04-16-1981, 43 y.o., female Today's Date: 02/21/2024  END OF SESSION:  PT End of Session - 02/21/24 1520     Visit Number 11    Number of Visits 16    Date for PT Re-Evaluation 03/14/24    Authorization Type BCBS 30 VL- AUTH  approved 7 visits 12/26/23-02/23/24 (not including evaluation), 4 additional visits approved from 01/23/24 to 02/21/24    Authorization - Visit Number 3    Authorization - Number of Visits 4    Progress Note Due on Visit 10    PT Start Time 1519    PT Stop Time 1557    PT Time Calculation (min) 38 min    Activity Tolerance Patient tolerated treatment well    Behavior During Therapy WFL for tasks assessed/performed                  Past Medical History:  Diagnosis Date   Allergy    COVID    Eczema    Fatigue    Hashimoto's disease    Hypothyroidism, acquired, autoimmune    Migraines    Thyroiditis, autoimmune    Hashimotos    Vaginal delivery 2006, 2009   Past Surgical History:  Procedure Laterality Date   BREAST BIOPSY Right 01/30/2024   [place] RT BREAST BX W LOC DEV 1ST LESION IMG BX SPEC US GUIDE 01/30/2024 [facility]GRAP[facility]   MOLE REMOVAL  12/13/2008   tongue growth  12/13/2008   WISDOM TOOTH EXTRACTION  12/13/1997   Patient Active Problem List   Diagnosis Date Noted   Palpitations 09/09/2022   Excessive daytime sleepiness 04/26/2022   Muscle weakness 04/26/2022   Arm weakness 04/26/2022   Autoimmune disorder (HCC) 04/26/2022   Hypnopompic hallucination  04/26/2022   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 10/07/2021   Premature ovarian failure 09/15/2020   Chronic migraine without aura, with intractable migraine, so stated, with status migrainosus 07/31/2020   Sleeping difficulty 10/15/2015   Migraine with aura and without status migrainosus, not intractable 08/19/2015   Tremor 04/14/2015   Goiter 09/21/2012   Hypothyroidism, acquired, autoimmune    Thyroiditis, autoimmune    Fatigue     PCP: Jarold Motto, PA  REFERRING PROVIDER: Teryl Lucy, MD  REFERRING DIAG: M75.100 (ICD-10-CM) - Tear of rotator cuff, unspecified laterality, unspecified tear extent, unspecified whether traumatic  THERAPY DIAG:  Acute pain of right shoulder  Muscle weakness (generalized)  Rationale for Evaluation and Treatment: Rehabilitation  ONSET DATE: March 2023  SUBJECTIVE:  SUBJECTIVE STATEMENT: 02/21/2024 States she is feeling good. States that her soreness is mostly in her biceps at time.   Eval: States in March she was doing swimmers presses and hurt both of her shoulders and thought it would go away. States the left arm got better but right arm never did. States that right before thanksgiving she was reaching towards her shopping cart that was rolling away from her and had increased pain.  Reports she has had increased pain since that incident in her right shoulder.  Reports having when she tries to do a little bit of yoga but her right shoulder is very tight and she cannot do all of the things she needs to do.  Reports occasional numbness and tingling in the right arm but it does not last.  Reports dressing can also be painful and sometimes she needs her husband to assist with donning and doffing  sweaters.  Reports she also cannot lay comfortably on her right side.    Hand dominance: Right  PERTINENT HISTORY: Migraines, Fatigue  PAIN:  Are you having pain? Yes: NPRS scale: 0/10 Pain location: right middle arm Pain description: soreness    Aggravating factors: nothing  Relieving factors: nothing   PRECAUTIONS: None  RED FLAGS: None   WEIGHT BEARING RESTRICTIONS: No  FALLS:  Has patient fallen in last 6 months? 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  DIAGNOSTIC FINDINGS:  MRI 11/24/23 IMPRESSION: 1. Mild supraspinatus, infraspinatus and subscapularis tendinosis without a tear.     PATIENT SURVEYS:  FOTO 51%  2/14 & 3/4 64% with predicted of 68%   COGNITION: Overall cognitive status: Within functional limits for tasks assessed     SENSATION: Not tested  POSTURE: Right shoulder anterior compared to left arm held in internal rotation and adduction on right    UE Measurements Upper Extremity Right 3/4 Left 3/4   A/PROM MMT A/PROM MMT  Shoulder Flexion 165 4 WFL   Shoulder Extension      Shoulder Abduction 150 4- WFL    Shoulder Adduction      Shoulder Internal Rotation/at 45 degrees abd Reaches to L3 SP*/ 68* 3+ Reaches to T4 SP/80   Shoulder External Rotation/ at 45 degrees abd Reaches to to T3 SP/ 42* 3+ Reaches to T4 SP/90   Elbow Flexion      Elbow Extension      Wrist Flexion      Wrist Extension      Wrist Supination      Wrist Pronation      Wrist Ulnar Deviation      Wrist Radial Deviation      Grip Strength NA  NA     (Blank rows = not tested)   * pain   JOINT MOBILITY TESTING:  Hypomobility noted in right shoulder in all directions, muscle guarding noted in all range of motion in right shoulder  PALPATION:  Increased tenderness to  palpation in right: infraspinatus , pec minor and biceps tendon  TREATMENT DATE:  02/21/2024  Therapeutic Exercise: Reviewed entire HEP   Supine:   Prone:    Seated: inferior mobilization to right shoulder 2 minutes 10" holds then 1 minute   S/l:  Standing: shoulder extension 2x10 10" holds B, shoulder flexion 2x10 10"holds, horizontal shoulder abd 2x20 B, reaching behind back 2x15 B, reachoing behind back with contralateral assist 5 minutes R    Quad:  shoulder taps 2x10 B, shoulder flexion 2x10 B, horizontal shoulder abd with contralateral head ROT 2x10 B Neuromuscular Re-education: Manual Therapy:     Therapeutic Activity:   Self Care: Trigger Point Dry Needling:  Modalities:         PATIENT EDUCATION:  Education details: on HEP, on rationale behind interventions and plan moving forward Person educated: Patient Education method: Explanation, Demonstration, and Handouts Education comprehension: verbalized understanding   HOME EXERCISE PROGRAM: [id number]  ASSESSMENT:  CLINICAL IMPRESSION: 02/21/2024 Reviewed entire HEP and answered all questions. Added new exercises to HEP. Overall patient doing well and has made progress towards her goals. Patient independent in HEP and would like to try to transition to HEP at this time. Continues to have limitations in reaching behind back and out to the side. Discussed MD follow up if this worsens or persists after a month. Patient to discharge from PT to HEP secondary to pt request.   Eval: Patient presents to physical therapy with complaints of chronic right shoulder pain.  Patient presents with limitations in range of motion, strength and overall functional mobility.  Patient tolerated use of vibration intervention to right shoulder significantly well demonstrating improved range of motion in right shoulder and  less muscle guarding.  Educated patient in safe use of percussion gun for vibration as well as importance of mobility exercises.  Patient demonstrated at least 40 degrees of right shoulder external rotation after use of percussion gun.  Overall patient would greatly benefit from skilled physical therapy to improve physical impairments and improve overall function and quality of life.  OBJECTIVE IMPAIRMENTS: decreased activity tolerance, decreased ROM, decreased strength, impaired UE functional use, postural dysfunction, and pain.   ACTIVITY LIMITATIONS: carrying, lifting, dressing, and reach over head  PARTICIPATION LIMITATIONS: meal prep, cleaning, driving, shopping, and occupation  PERSONAL FACTORS: Time since onset of injury/illness/exacerbation are also affecting patient's functional outcome.   REHAB POTENTIAL: Good  CLINICAL DECISION MAKING: Stable/uncomplicated  EVALUATION COMPLEXITY: Low   GOALS: Goals reviewed with patient? yes  SHORT TERM GOALS: Target date: 02/01/2024  Patient will be independent in self management strategies to improve quality of life and functional outcomes. Baseline: New Program Goal status: MET  2.  Patient will report at least 50% improvement in overall symptoms and/or function to demonstrate improved functional mobility Baseline: 0% better Goal status: MET  3.  Patient will be able to don and doff shirts and sweaters independently to improve ability to dress herself. Baseline: Unable requires husband assist Goal status: PROGRESSING      LONG TERM GOALS: Target date: 03/14/2024   Patient will report at least 75% improvement in overall symptoms and/or function to demonstrate improved functional mobility Baseline: 0% better Goal status: MET  2.  Patient will improve score on FOTO outcomes measure to projected score to demonstrate overall improved function and QOL Baseline: see above Goal status: PROGRESSING  3.  Patient will be able to  demonstrate at least 120 degrees of right shoulder flexion and abduction to improve overhead reach Baseline: Unable Goal status: MET  4.  Patient will demonstrate  at least 60 degrees of right shoulder external rotation with arm at 45 degrees of abduction to demonstrate improved shoulder mobility. Baseline: Unable see above Goal status: PROGRESSING   PLAN:  PT FREQUENCY: 1-2x/week of 16 visits over 12 weeks certification.  PT DURATION: 12 weeks  PLANNED INTERVENTIONS: 97110-Therapeutic exercises, 97530- Therapeutic activity, [id number]- Neuromuscular re-education, [phone]- Self Care, 96295- Manual therapy, [phone]- Gait training, [phone]- Orthotic Fit/training, [phone]- Canalith repositioning, [id number]- Aquatic Therapy, 97014- Electrical stimulation (unattended), [phone]- Ionotophoresis 4mg /ml Dexamethasone, Patient/Family education, Balance training, Stair training, Taping, Dry Needling, Joint mobilization, Joint manipulation, Spinal manipulation, Spinal mobilization, Cryotherapy, and Moist heat   PLAN FOR NEXT SESSION: DC to HEP   3:59 PM, 02/21/24 Tereasa Coop, DPT Physical Therapy with [facility]

## 2024-02-21 NOTE — Progress Notes (Unsigned)
 .  02/22/24: will get breakthrough headaches with weather changes. Having 3 migraines a month. Remains on Aimovig. When she gets a headaches she takes naproxen, Imitrex and Zofran. Headache will resolve may resolve in 30 minutes or it could last the entire day.   11/21/23: Reports that Botox is working well for her.  Remains on Aimovig as well.  Returns today for injections  08/16/23: She remains on on Botox and Aimovig.  States that this continues to work well for her.  Denies any new symptoms.  05/23/23: Patient reports that when she is able to take Qulipta in the Botox she does not have any migraines.  She states in the last 2 months she has not had any migraines.  She states typically weather would be a trigger for her.  We have had several thunderstorm that she did not develop a migraine.  Continues to use Imitrex for abortive therapy  02/09/23: 4 migraines a month. Continues Imitrex when she does have a headache.  11/08/22: Reports that Botox continues to work well for her.  Weather continues to be a trigger for her migraines.  Remains on Trokendi 150 mg daily and uses Imitrex for abortive therapy  08/05/2022: Botox continues to work well for her.  She reports that if there is a bad storm she typically will get a migraine otherwise she has been doing well  05/11/2022: botox continues to work well. Trigger for migraines is typically weather related   BOTOX PROCEDURE NOTE FOR MIGRAINE HEADACHE    Contraindications and precautions discussed with patient(above). Aseptic procedure was observed and patient tolerated procedure. Procedure performed by Butch Penny, NP  The condition has existed for more than 6 months, and pt does not have a diagnosis of ALS, Myasthenia Gravis or Lambert-Eaton Syndrome.  Risks and benefits of injections discussed and pt agrees to proceed with the procedure.  Written consent obtained  These injections are medically necessary. ]These injections do not cause sedations  or hallucinations which the oral therapies may cause.  Indication/Diagnosis: chronic migraine BOTOX(J0585) injection was performed according to protocol by [facility]. 200 units of BOTOX was dissolved into 4 cc NS.    Botox- 200 units x 1 vial Lot: [id number] Expiration: 02/2026 NDC: 4540-9811-91   Bacteriostatic 0.9% Sodium Chloride- * mL  Lot: [id number] Expiration: 10/13/2024 NDC: 6213-0865-78   Dx: I69.629      Description of procedure:  The patient was placed in a sitting position. The standard protocol was used for Botox as follows, with 5 units of Botox injected at each site:   -Procerus muscle, midline injection  -Corrugator muscle, bilateral injection  -Frontalis muscle, bilateral injection, with 2 sites each side, medial injection was performed in the upper one third of the frontalis muscle, in the region vertical from the medial inferior edge of the superior orbital rim. The lateral injection was again in the upper one third of the forehead vertically above the lateral limbus of the cornea, 1.5 cm lateral to the medial injection site.  -Temporalis muscle injection, 4 sites, bilaterally. The first injection was 3 cm above the tragus of the ear, second injection site was 1.5 cm to 3 cm up from the first injection site in line with the tragus of the ear. The third injection site was 1.5-3 cm forward between the first 2 injection sites. The fourth injection site was 1.5 cm posterior to the second injection site.  -Occipitalis muscle injection, 3 sites, bilaterally. The first injection was done one half way between the  occipital protuberance and the tip of the mastoid process behind the ear. The second injection site was done lateral and superior to the first, 1 fingerbreadth from the first injection. The third injection site was 1 fingerbreadth superiorly and medially from the first injection site.  -Cervical paraspinal muscle injection, 2 sites, bilateral knee first injection site  was 1 cm from the midline of the cervical spine, 3 cm inferior to the lower border of the occipital protuberance. The second injection site was 1.5 cm superiorly and laterally to the first injection site.  -Trapezius muscle injection was performed at 3 sites, bilaterally. The first injection site was in the upper trapezius muscle halfway between the inflection point of the neck, and the acromion. The second injection site was one half way between the acromion and the first injection site. The third injection was done between the first injection site and the inflection point of the neck.   Will return for repeat injection in 3 months.   A 200 units of Botox was used, 155 units were injected, the rest of the Botox was wasted. The patient tolerated the procedure well, there were no complications of the above procedure.  Butch Penny, MSN, NP-C 02/21/2024, 4:21 PM [facility] [address] [place], [place] [address] [phone]

## 2024-02-22 ENCOUNTER — Ambulatory Visit: Payer: BC Managed Care – PPO | Admitting: Adult Health

## 2024-02-22 DIAGNOSIS — E663 Overweight: Secondary | ICD-10-CM | POA: Diagnosis not present

## 2024-02-22 DIAGNOSIS — Z9189 Other specified personal risk factors, not elsewhere classified: Secondary | ICD-10-CM | POA: Diagnosis not present

## 2024-02-22 DIAGNOSIS — E559 Vitamin D deficiency, unspecified: Secondary | ICD-10-CM | POA: Diagnosis not present

## 2024-02-22 DIAGNOSIS — G43711 Chronic migraine without aura, intractable, with status migrainosus: Secondary | ICD-10-CM | POA: Diagnosis not present

## 2024-02-22 DIAGNOSIS — E039 Hypothyroidism, unspecified: Secondary | ICD-10-CM | POA: Diagnosis not present

## 2024-02-22 DIAGNOSIS — G43909 Migraine, unspecified, not intractable, without status migrainosus: Secondary | ICD-10-CM | POA: Diagnosis not present

## 2024-02-22 MED ORDER — ONABOTULINUMTOXINA 200 UNITS IJ SOLR
155.0000 [IU] | Freq: Once | INTRAMUSCULAR | Status: AC
  Administered 2024-02-22: 155 [IU] via INTRAMUSCULAR

## 2024-02-22 NOTE — Progress Notes (Signed)
 Botox- 200 units x 1 vial Lot: [id number] Expiration: 02/2026 NDC: 8657-8469-62  Bacteriostatic 0.9% Sodium Chloride- * mL  Lot: [id number] Expiration: 10/13/2024 NDC: 3244-0102-72  Dx: Z36.644 B/B Witnessed by Lennie Muckle, RN

## 2024-03-09 DIAGNOSIS — E039 Hypothyroidism, unspecified: Secondary | ICD-10-CM | POA: Diagnosis not present

## 2024-03-20 DIAGNOSIS — F411 Generalized anxiety disorder: Secondary | ICD-10-CM | POA: Diagnosis not present

## 2024-03-30 ENCOUNTER — Other Ambulatory Visit (HOSPITAL_COMMUNITY): Payer: Self-pay

## 2024-03-30 MED ORDER — TOPIRAMATE 25 MG PO TABS
25.0000 mg | ORAL_TABLET | Freq: Every day | ORAL | 1 refills | Status: DC
  Filled 2024-07-16: qty 90, 90d supply, fill #0

## 2024-03-30 MED ORDER — ESTRADIOL 0.05 MG/24HR TD PTWK
0.0500 mg | MEDICATED_PATCH | TRANSDERMAL | 3 refills | Status: DC
  Filled 2024-06-17: qty 12, 84d supply, fill #0
  Filled 2024-09-07: qty 12, 84d supply, fill #1

## 2024-03-30 MED ORDER — AIMOVIG 140 MG/ML ~~LOC~~ SOAJ
1.0000 mL | SUBCUTANEOUS | 5 refills | Status: DC
  Filled 2024-03-30 – 2024-04-22 (×2): qty 1, 30d supply, fill #0
  Filled 2024-05-20: qty 1, 30d supply, fill #1

## 2024-03-30 MED ORDER — LEVOTHYROXINE SODIUM 137 MCG PO TABS: 137.0000 ug | ORAL_TABLET | Freq: Every morning | ORAL | 3 refills | Status: DC

## 2024-03-30 MED ORDER — PROGESTERONE MICRONIZED 100 MG PO CAPS
100.0000 mg | ORAL_CAPSULE | Freq: Every day | ORAL | 3 refills | Status: DC
  Filled 2024-03-30: qty 90, 90d supply, fill #0
  Filled 2024-06-27: qty 90, 90d supply, fill #1
  Filled 2024-10-17: qty 90, 90d supply, fill #2

## 2024-04-03 ENCOUNTER — Other Ambulatory Visit: Payer: Self-pay

## 2024-04-03 ENCOUNTER — Encounter: Payer: Self-pay | Admitting: Adult Health

## 2024-04-03 MED ORDER — TOPIRAMATE 25 MG PO TABS
25.0000 mg | ORAL_TABLET | Freq: Every day | ORAL | 1 refills | Status: DC
  Filled 2024-04-03: qty 90, 90d supply, fill #0

## 2024-04-03 MED ORDER — PHENTERMINE HCL 37.5 MG PO TABS
37.5000 mg | ORAL_TABLET | Freq: Every day | ORAL | 0 refills | Status: DC
  Filled 2024-04-03: qty 30, 30d supply, fill #0

## 2024-04-04 ENCOUNTER — Other Ambulatory Visit (HOSPITAL_COMMUNITY): Payer: Self-pay

## 2024-04-04 ENCOUNTER — Telehealth: Payer: Self-pay

## 2024-04-04 NOTE — Telephone Encounter (Signed)
 Patient has new insurance with [facility] now and needs Aimovig  PA.

## 2024-04-04 NOTE — Telephone Encounter (Signed)
 Started auth in 01/25/24 Botox  encounter.

## 2024-04-04 NOTE — Telephone Encounter (Signed)
 Pharmacy Patient Advocate Encounter   Received notification from Physician's Office that prior authorization for Aimovig  140MG /ML auto-injectors is required/requested.   Insurance verification completed.   The patient is insured through [facility] .   Per test claim: PA required; PA submitted to above mentioned insurance via CoverMyMeds Key/confirmation #/EOC [facility] Status is pending

## 2024-04-04 NOTE — Telephone Encounter (Signed)
 Pt presented new [employer]. Completed auth via [facility], status is pending. Key: [id number]

## 2024-04-05 ENCOUNTER — Other Ambulatory Visit: Payer: Self-pay

## 2024-04-09 ENCOUNTER — Other Ambulatory Visit (HOSPITAL_COMMUNITY): Payer: Self-pay

## 2024-04-09 ENCOUNTER — Other Ambulatory Visit: Payer: Self-pay

## 2024-04-09 MED ORDER — SUMATRIPTAN SUCCINATE 100 MG PO TABS
ORAL_TABLET | ORAL | 5 refills | Status: DC
  Filled 2024-04-09: qty 7, 23d supply, fill #0
  Filled 2024-04-09: qty 3, 7d supply, fill #0

## 2024-04-09 MED ORDER — BOTOX 200 UNITS IJ SOLR
INTRAMUSCULAR | 3 refills | Status: AC
  Filled 2024-04-09: qty 1, fill #0
  Filled 2024-05-02: qty 1, 84d supply, fill #0
  Filled 2024-08-02: qty 1, 84d supply, fill #1
  Filled 2024-10-18 – 2024-11-06 (×2): qty 1, 84d supply, fill #2

## 2024-04-09 MED ORDER — ONDANSETRON 4 MG PO TBDP
4.0000 mg | ORAL_TABLET | Freq: Three times a day (TID) | ORAL | 0 refills | Status: AC | PRN
  Filled 2024-04-09: qty 30, 10d supply, fill #0

## 2024-04-09 NOTE — Telephone Encounter (Signed)
 Botox  200 unit refills sent to [facility].

## 2024-04-09 NOTE — Addendum Note (Signed)
 Addended by: Viktoria Gray on: 04/09/2024 01:50 PM   Modules accepted: Orders

## 2024-04-09 NOTE — Addendum Note (Signed)
 Addended by: Burns Carwin on: 04/09/2024 07:58 AM   Modules accepted: Orders

## 2024-04-09 NOTE — Telephone Encounter (Signed)
 Pharmacy Patient Advocate Encounter  Received notification from [facility] that Prior Authorization for Aimovig  140MG /ML auto-injectors has been APPROVED from 04/07/2027 to 10/03/2024. Unable to obtain price due to refill too soon rejection, last fill date 03/27/2024 next available fill date5/08/2024   PA #/Case ID/Reference #: PA Case ID #: [id number]

## 2024-04-09 NOTE — Telephone Encounter (Signed)
 Received approval from new [facility] plan, pt will now have to fill through [facility]. Please send rx, thank you!  Auth#: [id number] (04/06/24-10/03/24)

## 2024-04-12 ENCOUNTER — Encounter: Payer: Self-pay | Admitting: Adult Health

## 2024-04-12 NOTE — Telephone Encounter (Signed)
 Yes that's fine

## 2024-04-12 NOTE — Telephone Encounter (Signed)
 Pt in 02-22-2024 for botox , asking about changing medications for acute treatment,  see mychart message.

## 2024-04-17 ENCOUNTER — Telehealth (INDEPENDENT_AMBULATORY_CARE_PROVIDER_SITE_OTHER): Admitting: Adult Health

## 2024-04-17 ENCOUNTER — Other Ambulatory Visit (HOSPITAL_COMMUNITY): Payer: Self-pay

## 2024-04-17 DIAGNOSIS — F411 Generalized anxiety disorder: Secondary | ICD-10-CM | POA: Diagnosis not present

## 2024-04-17 DIAGNOSIS — G43711 Chronic migraine without aura, intractable, with status migrainosus: Secondary | ICD-10-CM

## 2024-04-17 DIAGNOSIS — G43009 Migraine without aura, not intractable, without status migrainosus: Secondary | ICD-10-CM

## 2024-04-17 MED ORDER — NURTEC 75 MG PO TBDP
ORAL_TABLET | ORAL | 11 refills | Status: AC
  Filled 2024-04-17: qty 8, 30d supply, fill #0
  Filled 2024-05-20 – 2024-05-21 (×2): qty 8, 30d supply, fill #1
  Filled 2024-06-17: qty 8, 30d supply, fill #2
  Filled 2024-07-16: qty 8, 30d supply, fill #3
  Filled 2024-08-23: qty 8, 30d supply, fill #4
  Filled 2024-09-07 – 2024-09-17 (×2): qty 8, 30d supply, fill #5
  Filled 2024-10-17: qty 8, 30d supply, fill #6
  Filled 2024-11-16: qty 8, 30d supply, fill #7
  Filled 2024-12-17: qty 8, 30d supply, fill #8

## 2024-04-17 NOTE — Progress Notes (Signed)
 PATIENT: Nancy [name] DOB: July 16, 1981  REASON FOR VISIT: follow up HISTORY FROM: patient  Virtual Visit via Video Note  I connected with Nancy [name] on 04/17/24 at  3:15 PM EDT by a video enabled telemedicine application located remotely at [facility] and verified that I am speaking with the correct person using two identifiers who was located at their own car located in [place]   I discussed the limitations of evaluation and management by telemedicine and the availability of in person appointments. The patient expressed understanding and agreed to proceed.   PATIENT: Nancy [name] DOB: 11/06/1981  REASON FOR VISIT: follow up HISTORY FROM: patient  HISTORY OF PRESENT ILLNESS: Today 04/17/24  Nancy [name] is a 43 y.o. female with a history of migraines . Returns today for follow-up.  She states that she would like to discuss a different abortive medication.  She has been using Imitrex  but does not feel that it works very well for her.  States recently she had to miss an entire day of work.  Also feels that Imitrex  makes her feel dizzy nauseous and spacey.  She remains on Botox  injections as well as Aimovig .  Denies any new symptoms.  She does state that she has had 2 ocular migraines in her life.  Typically does not get visual auras.  However she does report during this last headache she did see flashing lights.  She is currently on estrogen patches.  I did caution the patient about stroke risk in the setting of migraine with aura and ocular migraines.    REVIEW OF SYSTEMS: Out of a complete 14 system review of symptoms, the patient complains only of the following symptoms, and all other reviewed systems are negative.  ALLERGIES: Allergies  Allergen Reactions   Codeine Nausea Only    HOME MEDICATIONS: Outpatient Medications Prior to Visit  Medication Sig Dispense Refill   AIMOVIG  140 MG/ML SOAJ ADMINISTER  1 ML UNDER THE SKIN EVERY 30 DAYS 1 mL 5   botulinum toxin Type A  (BOTOX ) 200 units injection PROVIDER TO INJECT 155 UNITS INTO HEAD AND NECK MUSCLES EVERY 12 WEEKS.  DISCARD REMAINDER. 1 each 3   Calcium -Magnesium -Vitamin D  (CITRACAL CALCIUM +D) 600-40-500 MG-MG-UNIT TB24 Take 1 tablet at dinner and 1 tablet at bedtime. 90 tablet 1   cetirizine (ZYRTEC) 10 MG tablet Take 10 mg by mouth daily as needed.     EPINEPHrine  0.3 mg/0.3 mL IJ SOAJ injection Inject 0.3 mg into the muscle as needed for anaphylaxis. (Patient not taking: Reported on 12/13/2023) 2 each 1   Erenumab -aooe (AIMOVIG ) 140 MG/ML SOAJ Inject 140 mg (1 ml) into the skin every 30 (thirty) days. 1 mL 5   estradiol  (CLIMARA  - DOSED IN MG/24 HR) 0.05 mg/24hr patch Place 1 patch (0.05 mg total) onto the skin once a week. 12 patch 3   estradiol  (CLIMARA  - DOSED IN MG/24 HR) 0.05 mg/24hr patch Place 1 patch (0.05 mg total) onto the skin once a week. 12 patch 3   levothyroxine  (SYNTHROID ) 112 MCG tablet TAKE 1 1/2 TABLETS BY MOUTH DAILY (Patient taking differently: Take 112 mcg by mouth daily before breakfast.) 180 tablet 3   levothyroxine  (SYNTHROID ) 137 MCG tablet Take 1 tablet (137 mcg total) by mouth in the morning on an empty stomach. 30 tablet 3   Magnesium  500 MG TABS Take 1 tablet by mouth daily in the afternoon.  naproxen (NAPROSYN) 500 MG tablet Take 500 mg by mouth as needed.     ondansetron  (ZOFRAN -ODT) 4 MG disintegrating tablet Dissolve 1 tablet (4 mg total) by mouth every 8 (eight) hours as needed for nausea or vomiting. 30 tablet 0   phentermine  (ADIPEX-P ) 37.5 MG tablet 1/2-1 tablet before breakfast Orally Once a day for 30 days     phentermine  (ADIPEX-P ) 37.5 MG tablet Take 1 tablet (37.5 mg total) by mouth daily before breakfast. 30 tablet 0   progesterone  (PROMETRIUM ) 100 MG capsule Take 1 capsule (100 mg total) by mouth daily. 90 capsule 3   progesterone  (PROMETRIUM ) 100 MG capsule Take 1 capsule (100 mg total) by mouth  daily. 90 capsule 3   propranolol  (INDERAL ) 10 MG tablet TAKE 1 TABLET(10 MG) BY MOUTH EVERY 8 HOURS AS NEEDED 270 tablet 3   SUMAtriptan  (IMITREX ) 100 MG tablet TAKE 1 TABLET(100 MG) BY MOUTH 1 TIME. MAY REPEAT IN 2 HOURS IF HEADACHE PERSISTS OR RECURS 10 tablet 5   topiramate  (TOPAMAX ) 25 MG tablet Take 1 tablet (25 mg total) by mouth daily. 90 tablet 1   topiramate  (TOPAMAX ) 25 MG tablet Take 1 tablet (25 mg total) by mouth daily. 90 tablet 1   No facility-administered medications prior to visit.    PAST MEDICAL HISTORY: Past Medical History:  Diagnosis Date   Allergy    COVID    Eczema    Fatigue    Hashimoto's disease    Hypothyroidism, acquired, autoimmune    Migraines    Thyroiditis, autoimmune    Hashimotos    Vaginal delivery 2006, 2009    PAST SURGICAL HISTORY: Past Surgical History:  Procedure Laterality Date   BREAST BIOPSY Right 01/30/2024   US  RT BREAST BX W LOC DEV 1ST LESION IMG BX SPEC US  GUIDE 01/30/2024 [facility]AMMOGRAP[facility]   MOLE REMOVAL  12/13/2008   tongue growth  12/13/2008   WISDOM TOOTH EXTRACTION  12/13/1997    FAMILY HISTORY: Family History  Problem Relation Age of Onset   Stroke Mother    Lung cancer Mother        Small cell   COPD Mother    Diabetes Mellitus II Mother    Anxiety disorder Mother    Cancer Mother    Benign prostatic hyperplasia Father    Stroke Maternal Grandmother    Arthritis Maternal Grandmother    Varicose Veins Maternal Grandmother    Heart disease Maternal Grandmother    Atrial fibrillation Maternal Grandmother    Heart disease Maternal Grandfather    Hashimoto's thyroiditis Daughter        Hashimoto's 2   Diabetes Son    Hypothyroidism Son    Breast cancer Other    Colon cancer Other    Atrial fibrillation Maternal Uncle    Migraines Neg Hx     SOCIAL HISTORY: Social History   Socioeconomic History   Marital status: Married    Spouse name: Olaf   Number of children: 2   Years of education: 16   Highest  education level: Bachelor's degree (e.g., BA, AB, BS)  Occupational History   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  Tobacco Use   Smoking status: Never   Smokeless tobacco: Never  Vaping Use   Vaping status: Never Used  Substance and Sexual Activity   Alcohol use: No   Drug use: No   Sexual activity: Yes    Partners: Male    Birth control/protection: Post-menopausal  Other Topics Concern   Not  on file  Social History Narrative   68 and 41 year old kids   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   Coffee every morning (12 oz)   Social Drivers of Corporate investment banker Strain: Low Risk  (05/20/2023)   Overall Financial Resource Strain (CARDIA)    Difficulty of Paying Living Expenses: Not very hard  Food Insecurity: No Food Insecurity (05/20/2023)   Hunger Vital Sign    Worried About Running Out of Food in the Last Year: Never true    Ran Out of Food in the Last Year: Never true  Transportation Needs: No Transportation Needs (05/20/2023)   PRAPARE - Administrator, Civil Service (Medical): No    Lack of Transportation (Non-Medical): No  Physical Activity: Sufficiently Active (05/20/2023)   Exercise Vital Sign    Days of Exercise per Week: 5 days    Minutes of Exercise per Session: 30 min  Stress: No Stress Concern Present (05/20/2023)   [employer] of Occupational Health - Occupational Stress Questionnaire    Feeling of Stress : Not at all  Social Connections: Socially Integrated (05/20/2023)   Social Connection and Isolation Panel [NHANES]    Frequency of Communication with Friends and Family: Once a week    Frequency of Social Gatherings with Friends and Family: Twice a week    Attends Religious Services: More than 4 times per year    Active Member of [employer] or Organizations: Yes    Attends Engineer, structural: More than 4 times per year    Marital Status: Married  Catering manager Violence: Not on file      PHYSICAL  EXAM Generalized: Well developed, in no acute distress   Neurological examination  Mentation: Alert oriented to time, place, history taking. Follows all commands speech and language fluent Cranial nerve II-XII:. Facial symmetry noted.   DIAGNOSTIC DATA (LABS, IMAGING, TESTING) - I reviewed patient records, labs, notes, testing and imaging myself where available.  Lab Results  Component Value Date   WBC 4.9 09/07/2022   HGB 13.2 09/07/2022   HCT 39.7 09/07/2022   MCV 87.8 09/07/2022   PLT 203 09/07/2022      Component Value Date/Time   NA 141 09/07/2022 1334   NA 144 01/08/2021 1557   K 3.9 09/07/2022 1334   CL 109 09/07/2022 1334   CO2 22 09/07/2022 1334   GLUCOSE 94 09/07/2022 1334   BUN 12 09/07/2022 1334   BUN 15 01/08/2021 1557   CREATININE 0.80 09/07/2022 1334   CREATININE 0.90 09/01/2020 1027   CALCIUM  9.7 09/07/2022 1334   PROT 7.3 02/03/2022 1620   ALBUMIN 4.3 02/01/2022 1429   ALBUMIN 4.6 01/08/2021 1557   AST 14 02/01/2022 1429   ALT 16 02/01/2022 1429   ALKPHOS 37 (L) 02/01/2022 1429   BILITOT 0.2 02/01/2022 1429   BILITOT <0.2 01/08/2021 1557   GFRNONAA >60 09/07/2022 1334   GFRAA 86 01/08/2021 1557   Lab Results  Component Value Date   CHOL 178 09/07/2021   HDL 65.20 09/07/2021   LDLCALC 87 09/07/2021   TRIG 130.0 09/07/2021   CHOLHDL 3 09/07/2021   Lab Results  Component Value Date   HGBA1C 5.4 02/03/2022   Lab Results  Component Value Date   VITAMINB12 928 02/03/2022   Lab Results  Component Value Date   TSH 1.21 08/30/2022      ASSESSMENT AND PLAN 43 y.o. year old female  has a past  medical history of Allergy, COVID, Eczema, Fatigue, Hashimoto's disease, Hypothyroidism, acquired, autoimmune, Migraines, Thyroiditis, autoimmune, and Vaginal delivery (2006, 2009). here with:  1.  Migraine headaches  -Continue Botox  injections every 12 weeks -Continue monthly Aimovig  injections -Stop Imitrex  -Start Nurtec 75 mg at the onset of a  migraine.  Only 1 tablet in 24 hours - Keep next appointment for Botox   Patient is only had 2 ocular migraines in her life and most recently headache with flashing lights during the headache.  Patient is on estrogen patches.  I did review stroke risk with the patient.  Advised her to discuss risk versus benefit with her OB/GYN    Clem Currier, MSN, NP-C 04/17/2024, 3:23 PM [facility] [address] [place], [place] [address] [phone]

## 2024-04-17 NOTE — Patient Instructions (Signed)
 Try Nurtec for abortive therapy.  Take 75 mg at the onset of a migraine.  Only 1 tablet in 24 hours

## 2024-04-18 ENCOUNTER — Other Ambulatory Visit (HOSPITAL_COMMUNITY): Payer: Self-pay

## 2024-04-22 ENCOUNTER — Other Ambulatory Visit: Payer: Self-pay

## 2024-04-23 ENCOUNTER — Other Ambulatory Visit (HOSPITAL_COMMUNITY): Payer: Self-pay

## 2024-04-23 MED ORDER — LEVOTHYROXINE SODIUM 150 MCG PO TABS
150.0000 ug | ORAL_TABLET | Freq: Every day | ORAL | 3 refills | Status: DC
  Filled 2024-04-23 – 2024-05-01 (×6): qty 90, 90d supply, fill #0

## 2024-04-24 ENCOUNTER — Other Ambulatory Visit (HOSPITAL_COMMUNITY): Payer: Self-pay

## 2024-04-24 MED ORDER — PHENTERMINE HCL 37.5 MG PO TABS
37.5000 mg | ORAL_TABLET | Freq: Every day | ORAL | 0 refills | Status: DC
  Filled 2024-04-24 – 2024-05-01 (×3): qty 30, 30d supply, fill #0

## 2024-04-27 ENCOUNTER — Other Ambulatory Visit (HOSPITAL_COMMUNITY): Payer: Self-pay

## 2024-05-01 ENCOUNTER — Other Ambulatory Visit: Payer: Self-pay

## 2024-05-01 ENCOUNTER — Other Ambulatory Visit (HOSPITAL_COMMUNITY): Payer: Self-pay

## 2024-05-02 ENCOUNTER — Other Ambulatory Visit: Payer: Self-pay

## 2024-05-02 ENCOUNTER — Other Ambulatory Visit (HOSPITAL_COMMUNITY): Payer: Self-pay

## 2024-05-02 NOTE — Progress Notes (Signed)
 Specialty Pharmacy Initial Fill Coordination Note  Nancy [name] is a 43 y.o. female contacted today regarding initial fill of specialty medication(s) OnabotulinumtoxinA  (Botox )   Patient requested Courier to Provider Office   Delivery date: 05/09/24   Verified address: [address], [place], [place] [address] [facility]   Medication will be filled on 05/03/2024.   Patient is aware of 0 copayment.

## 2024-05-08 ENCOUNTER — Other Ambulatory Visit: Payer: Self-pay

## 2024-05-15 DIAGNOSIS — F411 Generalized anxiety disorder: Secondary | ICD-10-CM | POA: Diagnosis not present

## 2024-05-16 ENCOUNTER — Ambulatory Visit (INDEPENDENT_AMBULATORY_CARE_PROVIDER_SITE_OTHER): Admitting: Adult Health

## 2024-05-16 VITALS — BP 129/90 | HR 107

## 2024-05-16 DIAGNOSIS — G43711 Chronic migraine without aura, intractable, with status migrainosus: Secondary | ICD-10-CM

## 2024-05-16 MED ORDER — ONABOTULINUMTOXINA 200 UNITS IJ SOLR
155.0000 [IU] | Freq: Once | INTRAMUSCULAR | Status: AC
Start: 2024-05-16 — End: 2024-05-16
  Administered 2024-05-16: 155 [IU] via INTRAMUSCULAR

## 2024-05-16 NOTE — Progress Notes (Signed)
 Botox - 200 units x 1 vial Lot: [id number] Expiration: 03/2026 NDC: 2841-3244-01  Bacteriostatic 0.9% Sodium Chloride - 4 mL  Lot: [id number] Expiration: 06/11/2025 NDC: 6644-0347-42  Dx: V95.638 S/P  Witnessed by Archibald Kobus RN

## 2024-05-16 NOTE — Progress Notes (Signed)
 .  05/16/24: no migraines in the last month. Had several in April. At the last visit she was started on t on nurtec and that has been beneficial. Remains on aimovig .   02/22/24: will get breakthrough headaches with weather changes. Having 3 migraines a month. Remains on Aimovig . When she gets a headaches she takes naproxen, Imitrex  and Zofran . Headache will resolve may resolve in 30 minutes or it could last the entire day.   11/21/23: Reports that Botox  is working well for her.  Remains on Aimovig  as well.  Returns today for injections  08/16/23: She remains on on Botox  and Aimovig .  States that this continues to work well for her.  Denies any new symptoms.  05/23/23: Patient reports that when she is able to take Qulipta  in the Botox  she does not have any migraines.  She states in the last 2 months she has not had any migraines.  She states typically weather would be a trigger for her.  We have had several thunderstorm that she did not develop a migraine.  Continues to use Imitrex  for abortive therapy  02/09/23: 4 migraines a month. Continues Imitrex  when she does have a headache.  11/08/22: Reports that Botox  continues to work well for her.  Weather continues to be a trigger for her migraines.  Remains on Trokendi  150 mg daily and uses Imitrex  for abortive therapy  08/05/2022: Botox  continues to work well for her.  She reports that if there is a bad storm she typically will get a migraine otherwise she has been doing well  05/11/2022: botox  continues to work well. Trigger for migraines is typically weather related   BOTOX  PROCEDURE NOTE FOR MIGRAINE HEADACHE    Contraindications and precautions discussed with patient(above). Aseptic procedure was observed and patient tolerated procedure. Procedure performed by Clem Currier, NP  The condition has existed for more than 6 months, and pt does not have a diagnosis of ALS, Myasthenia Gravis or Lambert-Eaton Syndrome.  Risks and benefits of injections  discussed and pt agrees to proceed with the procedure.  Written consent obtained  These injections are medically necessary. ]These injections do not cause sedations or hallucinations which the oral therapies may cause.  Indication/Diagnosis: chronic migraine BOTOX (E4540) injection was performed according to protocol by [employer]er[employer]. 200 units of BOTOX  was dissolved into 4 cc NS.      Botox - 200 units x 1 vial Lot: [id number] Expiration: 03/2026 NDC: 9811-9147-82   Bacteriostatic 0.9% Sodium Chloride - 4 mL  Lot: [id number] Expiration: 06/11/2025 NDC: 0865-7846-96   Dx: E95.28          Description of procedure:  The patient was placed in a sitting position. The standard protocol was used for Botox  as follows, with 5 units of Botox  injected at each site:   -Procerus muscle, midline injection  -Corrugator muscle, bilateral injection  -Frontalis muscle, bilateral injection, with 2 sites each side, medial injection was performed in the upper one third of the frontalis muscle, in the region vertical from the medial inferior edge of the superior orbital rim. The lateral injection was again in the upper one third of the forehead vertically above the lateral limbus of the cornea, 1.5 cm lateral to the medial injection site.  -Temporalis muscle injection, 4 sites, bilaterally. The first injection was 3 cm above the tragus of the ear, second injection site was 1.5 cm to 3 cm up from the first injection site in line with the tragus of the ear. The third injection site was 1.5-3  cm forward between the first 2 injection sites. The fourth injection site was 1.5 cm posterior to the second injection site.  -Occipitalis muscle injection, 3 sites, bilaterally. The first injection was done one half way between the occipital protuberance and the tip of the mastoid process behind the ear. The second injection site was done lateral and superior to the first, 1 fingerbreadth from the first injection. The third  injection site was 1 fingerbreadth superiorly and medially from the first injection site.  -Cervical paraspinal muscle injection, 2 sites, bilateral knee first injection site was 1 cm from the midline of the cervical spine, 3 cm inferior to the lower border of the occipital protuberance. The second injection site was 1.5 cm superiorly and laterally to the first injection site.  -Trapezius muscle injection was performed at 3 sites, bilaterally. The first injection site was in the upper trapezius muscle halfway between the inflection point of the neck, and the acromion. The second injection site was one half way between the acromion and the first injection site. The third injection was done between the first injection site and the inflection point of the neck.   Will return for repeat injection in 3 months.   A 200 units of Botox  was used, 155 units were injected, the rest of the Botox  was wasted. The patient tolerated the procedure well, there were no complications of the above procedure.  Clem Currier, MSN, NP-C 05/16/2024, 8:02 AM [facility] [address] [place], [place] [address] [phone]

## 2024-05-21 ENCOUNTER — Telehealth: Payer: Self-pay | Admitting: Pharmacist

## 2024-05-21 ENCOUNTER — Other Ambulatory Visit (HOSPITAL_COMMUNITY): Payer: Self-pay

## 2024-05-21 ENCOUNTER — Other Ambulatory Visit: Payer: Self-pay

## 2024-05-21 NOTE — Telephone Encounter (Signed)
 Pharmacy Patient Advocate Encounter  Received notification from [facility] that Prior Authorization for Nurtec 75MG  dispersible tablets has been APPROVED from 05/21/2024 to 11/17/2024   PA #/Case ID/Reference #: [id number]

## 2024-05-21 NOTE — Telephone Encounter (Signed)
 Pharmacy Patient Advocate Encounter   Received notification from Patient Pharmacy that prior authorization for Nurtec 75MG  dispersible tablets is required/requested.   Insurance verification completed.   The patient is insured through [facility] .   Per test claim: PA required; PA submitted to above mentioned insurance via CoverMyMeds Key/confirmation #/EOC [id number] Status is pending

## 2024-05-24 ENCOUNTER — Other Ambulatory Visit (HOSPITAL_COMMUNITY): Payer: Self-pay

## 2024-05-24 DIAGNOSIS — M5416 Radiculopathy, lumbar region: Secondary | ICD-10-CM | POA: Diagnosis not present

## 2024-05-25 ENCOUNTER — Other Ambulatory Visit (HOSPITAL_COMMUNITY): Payer: Self-pay

## 2024-05-25 MED ORDER — PREDNISONE 10 MG (21) PO TBPK
ORAL_TABLET | ORAL | 0 refills | Status: AC
  Filled 2024-05-25: qty 21, 6d supply, fill #0

## 2024-06-06 ENCOUNTER — Other Ambulatory Visit (HOSPITAL_COMMUNITY): Payer: Self-pay

## 2024-06-06 DIAGNOSIS — M5416 Radiculopathy, lumbar region: Secondary | ICD-10-CM | POA: Diagnosis not present

## 2024-06-06 MED ORDER — MELOXICAM 15 MG PO TABS
15.0000 mg | ORAL_TABLET | Freq: Every day | ORAL | 1 refills | Status: DC | PRN
  Filled 2024-06-06: qty 30, 30d supply, fill #0
  Filled 2024-07-11: qty 30, 30d supply, fill #1

## 2024-06-07 ENCOUNTER — Other Ambulatory Visit (HOSPITAL_COMMUNITY): Payer: Self-pay

## 2024-06-12 DIAGNOSIS — F411 Generalized anxiety disorder: Secondary | ICD-10-CM | POA: Diagnosis not present

## 2024-06-17 ENCOUNTER — Other Ambulatory Visit: Payer: Self-pay | Admitting: Adult Health

## 2024-06-18 ENCOUNTER — Other Ambulatory Visit (HOSPITAL_COMMUNITY): Payer: Self-pay

## 2024-06-18 ENCOUNTER — Other Ambulatory Visit: Payer: Self-pay

## 2024-06-18 MED ORDER — AIMOVIG 140 MG/ML ~~LOC~~ SOAJ
1.0000 mL | SUBCUTANEOUS | 5 refills | Status: DC
  Filled 2024-06-18: qty 1, 30d supply, fill #0
  Filled 2024-07-11 – 2024-07-12 (×2): qty 1, 30d supply, fill #1
  Filled 2024-08-17: qty 1, 30d supply, fill #2
  Filled 2024-09-07 – 2024-09-10 (×2): qty 1, 30d supply, fill #3
  Filled 2024-10-17 – 2024-10-31 (×2): qty 1, 30d supply, fill #4
  Filled 2024-11-25: qty 1, 30d supply, fill #5

## 2024-06-22 DIAGNOSIS — E039 Hypothyroidism, unspecified: Secondary | ICD-10-CM | POA: Diagnosis not present

## 2024-06-22 DIAGNOSIS — E310 Autoimmune polyglandular failure: Secondary | ICD-10-CM | POA: Diagnosis not present

## 2024-06-22 DIAGNOSIS — R002 Palpitations: Secondary | ICD-10-CM | POA: Diagnosis not present

## 2024-06-22 DIAGNOSIS — E2839 Other primary ovarian failure: Secondary | ICD-10-CM | POA: Diagnosis not present

## 2024-06-22 DIAGNOSIS — G43909 Migraine, unspecified, not intractable, without status migrainosus: Secondary | ICD-10-CM | POA: Diagnosis not present

## 2024-07-10 ENCOUNTER — Ambulatory Visit (HOSPITAL_BASED_OUTPATIENT_CLINIC_OR_DEPARTMENT_OTHER)
Admission: RE | Admit: 2024-07-10 | Discharge: 2024-07-10 | Disposition: A | Discharge status: Home / Self Care | Source: Ambulatory Visit | Attending: Nurse Practitioner | Admitting: Nurse Practitioner

## 2024-07-10 DIAGNOSIS — E2839 Other primary ovarian failure: Secondary | ICD-10-CM | POA: Diagnosis not present

## 2024-07-10 DIAGNOSIS — Z13820 Encounter for screening for osteoporosis: Secondary | ICD-10-CM | POA: Diagnosis not present

## 2024-07-10 DIAGNOSIS — F411 Generalized anxiety disorder: Secondary | ICD-10-CM | POA: Diagnosis not present

## 2024-07-10 DIAGNOSIS — Z780 Asymptomatic menopausal state: Secondary | ICD-10-CM | POA: Diagnosis not present

## 2024-07-11 ENCOUNTER — Other Ambulatory Visit: Payer: Self-pay

## 2024-07-11 ENCOUNTER — Encounter (HOSPITAL_COMMUNITY): Payer: Self-pay

## 2024-07-11 ENCOUNTER — Other Ambulatory Visit (HOSPITAL_COMMUNITY): Payer: Self-pay

## 2024-07-12 ENCOUNTER — Other Ambulatory Visit: Payer: Self-pay

## 2024-07-12 ENCOUNTER — Ambulatory Visit: Payer: Self-pay | Admitting: Nurse Practitioner

## 2024-07-16 ENCOUNTER — Other Ambulatory Visit: Payer: Self-pay

## 2024-07-16 ENCOUNTER — Other Ambulatory Visit (HOSPITAL_COMMUNITY): Payer: Self-pay

## 2024-07-18 ENCOUNTER — Other Ambulatory Visit (HOSPITAL_COMMUNITY): Payer: Self-pay

## 2024-07-18 DIAGNOSIS — M5416 Radiculopathy, lumbar region: Secondary | ICD-10-CM | POA: Diagnosis not present

## 2024-07-18 MED ORDER — PREDNISONE 10 MG (21) PO TBPK
ORAL_TABLET | ORAL | 0 refills | Status: AC
  Filled 2024-07-18: qty 21, 6d supply, fill #0

## 2024-07-20 ENCOUNTER — Other Ambulatory Visit (HOSPITAL_COMMUNITY): Payer: Self-pay

## 2024-07-20 MED ORDER — LEVOTHYROXINE SODIUM 175 MCG PO TABS
175.0000 ug | ORAL_TABLET | Freq: Every morning | ORAL | 3 refills | Status: AC
  Filled 2024-07-20: qty 90, 90d supply, fill #0
  Filled 2024-08-17 – 2024-10-17 (×4): qty 90, 90d supply, fill #1
  Filled 2025-01-17: qty 90, 90d supply, fill #0

## 2024-07-30 ENCOUNTER — Other Ambulatory Visit: Payer: Self-pay

## 2024-08-02 ENCOUNTER — Other Ambulatory Visit: Payer: Self-pay | Admitting: Pharmacy Technician

## 2024-08-02 ENCOUNTER — Other Ambulatory Visit: Payer: Self-pay

## 2024-08-02 NOTE — Progress Notes (Signed)
 Specialty Pharmacy Refill Coordination Note  Nancy [name] is a 43 y.o. female contacted today regarding refills of specialty medication(s) OnabotulinumtoxinA  (Botox )   Patient requested Courier to Provider Office   Delivery date: 08/06/24   Verified address: [facility] [address]   Medication will be filled on 08/03/24.  Injection Appointment: 08/14/24

## 2024-08-03 ENCOUNTER — Other Ambulatory Visit: Payer: Self-pay

## 2024-08-07 ENCOUNTER — Other Ambulatory Visit: Payer: BC Managed Care – PPO

## 2024-08-13 NOTE — Progress Notes (Unsigned)
 .  05/16/24: no migraines in the last month. Had several in April. At the last visit she was started on t on nurtec and that has been beneficial. Remains on aimovig .   02/22/24: will get breakthrough headaches with weather changes. Having 3 migraines a month. Remains on Aimovig . When she gets a headaches she takes naproxen, Imitrex  and Zofran . Headache will resolve may resolve in 30 minutes or it could last the entire day.   11/21/23: Reports that Botox  is working well for her.  Remains on Aimovig  as well.  Returns today for injections  08/16/23: She remains on on Botox  and Aimovig .  States that this continues to work well for her.  Denies any new symptoms.  05/23/23: Patient reports that when she is able to take Qulipta  in the Botox  she does not have any migraines.  She states in the last 2 months she has not had any migraines.  She states typically weather would be a trigger for her.  We have had several thunderstorm that she did not develop a migraine.  Continues to use Imitrex  for abortive therapy  02/09/23: 4 migraines a month. Continues Imitrex  when she does have a headache.  11/08/22: Reports that Botox  continues to work well for her.  Weather continues to be a trigger for her migraines.  Remains on Trokendi  150 mg daily and uses Imitrex  for abortive therapy  08/05/2022: Botox  continues to work well for her.  She reports that if there is a bad storm she typically will get a migraine otherwise she has been doing well  05/11/2022: botox  continues to work well. Trigger for migraines is typically weather related   BOTOX  PROCEDURE NOTE FOR MIGRAINE HEADACHE    Contraindications and precautions discussed with patient(above). Aseptic procedure was observed and patient tolerated procedure. Procedure performed by [name] Russell, NP  The condition has existed for more than 6 months, and pt does not have a diagnosis of ALS, Myasthenia Gravis or Lambert-Eaton Syndrome.  Risks and benefits of injections  discussed and pt agrees to proceed with the procedure.  Written consent obtained  These injections are medically necessary. ]These injections do not cause sedations or hallucinations which the oral therapies may cause.  Indication/Diagnosis: chronic migraine BOTOX (G9414) injection was performed according to protocol by Aller[employer]. 200 units of BOTOX  was dissolved into 4 cc NS.      Botox - 200 units x 1 vial Lot: [id number] Expiration: 03/2026 NDC: 9976-6078-97   Bacteriostatic 0.9% Sodium Chloride - 4 mL  Lot: [id number] Expiration: 06/11/2025 NDC: 9590-8033-97   Dx: H56.28          Description of procedure:  The patient was placed in a sitting position. The standard protocol was used for Botox  as follows, with 5 units of Botox  injected at each site:   -Procerus muscle, midline injection  -Corrugator muscle, bilateral injection  -Frontalis muscle, bilateral injection, with 2 sites each side, medial injection was performed in the upper one third of the frontalis muscle, in the region vertical from the medial inferior edge of the superior orbital rim. The lateral injection was again in the upper one third of the forehead vertically above the lateral limbus of the cornea, 1.5 cm lateral to the medial injection site.  -Temporalis muscle injection, 4 sites, bilaterally. The first injection was 3 cm above the tragus of the ear, second injection site was 1.5 cm to 3 cm up from the first injection site in line with the tragus of the ear. The third injection site was 1.5-3  cm forward between the first 2 injection sites. The fourth injection site was 1.5 cm posterior to the second injection site.  -Occipitalis muscle injection, 3 sites, bilaterally. The first injection was done one half way between the occipital protuberance and the tip of the mastoid process behind the ear. The second injection site was done lateral and superior to the first, 1 fingerbreadth from the first injection. The third  injection site was 1 fingerbreadth superiorly and medially from the first injection site.  -Cervical paraspinal muscle injection, 2 sites, bilateral knee first injection site was 1 cm from the midline of the cervical spine, 3 cm inferior to the lower border of the occipital protuberance. The second injection site was 1.5 cm superiorly and laterally to the first injection site.  -Trapezius muscle injection was performed at 3 sites, bilaterally. The first injection site was in the upper trapezius muscle halfway between the inflection point of the neck, and the acromion. The second injection site was one half way between the acromion and the first injection site. The third injection was done between the first injection site and the inflection point of the neck.   Will return for repeat injection in 3 months.   A 200 units of Botox  was used, 155 units were injected, the rest of the Botox  was wasted. The patient tolerated the procedure well, there were no complications of the above procedure.  [name] Russell, MSN, NP-C 08/13/2024, 9:34 AM [facility] [address] [place], [place] [address] [phone]

## 2024-08-14 ENCOUNTER — Ambulatory Visit (INDEPENDENT_AMBULATORY_CARE_PROVIDER_SITE_OTHER): Admitting: Adult Health

## 2024-08-14 ENCOUNTER — Encounter: Payer: Self-pay | Admitting: Adult Health

## 2024-08-14 VITALS — BP 131/75 | HR 89

## 2024-08-14 DIAGNOSIS — G43711 Chronic migraine without aura, intractable, with status migrainosus: Secondary | ICD-10-CM

## 2024-08-14 MED ORDER — ONABOTULINUMTOXINA 200 UNITS IJ SOLR
155.0000 [IU] | Freq: Once | INTRAMUSCULAR | Status: AC
  Administered 2024-08-14: 155 [IU] via INTRAMUSCULAR

## 2024-08-14 NOTE — Progress Notes (Signed)
   Botox - 200 units x 1 vial Lot: [id number] Expiration: 10/2026 NDC: 9976-6078-97   Bacteriostatic 0.9% Sodium Chloride - 4 mL  Lot: [id number] Expiration: 10/12/2025 NDC: 9590-8033-97   Dx: H56.28 SP Witnessed by Heather MOTE RN

## 2024-08-17 ENCOUNTER — Other Ambulatory Visit (HOSPITAL_COMMUNITY): Payer: Self-pay

## 2024-08-20 ENCOUNTER — Other Ambulatory Visit (HOSPITAL_COMMUNITY): Payer: Self-pay

## 2024-08-20 ENCOUNTER — Other Ambulatory Visit: Payer: Self-pay

## 2024-08-27 ENCOUNTER — Encounter: Payer: Self-pay | Admitting: Physician Assistant

## 2024-08-28 ENCOUNTER — Encounter: Payer: Self-pay | Admitting: Physician Assistant

## 2024-08-28 ENCOUNTER — Telehealth: Payer: Self-pay | Admitting: Adult Health

## 2024-08-28 ENCOUNTER — Ambulatory Visit: Admitting: Physician Assistant

## 2024-08-28 VITALS — BP 128/70 | HR 89 | Temp 97.9°F | Ht 67.5 in | Wt 196.4 lb

## 2024-08-28 DIAGNOSIS — Z23 Encounter for immunization: Secondary | ICD-10-CM

## 2024-08-28 DIAGNOSIS — Z1283 Encounter for screening for malignant neoplasm of skin: Secondary | ICD-10-CM

## 2024-08-28 DIAGNOSIS — E669 Obesity, unspecified: Secondary | ICD-10-CM | POA: Diagnosis not present

## 2024-08-28 DIAGNOSIS — F418 Other specified anxiety disorders: Secondary | ICD-10-CM | POA: Diagnosis not present

## 2024-08-28 DIAGNOSIS — Z0000 Encounter for general adult medical examination without abnormal findings: Secondary | ICD-10-CM

## 2024-08-28 LAB — CBC WITH DIFFERENTIAL/PLATELET
Basophils Absolute: 0 K/uL (ref 0.0–0.1)
Basophils Relative: 0.9 % (ref 0.0–3.0)
Eosinophils Absolute: 0 K/uL (ref 0.0–0.7)
Eosinophils Relative: 0.8 % (ref 0.0–5.0)
HCT: 41.9 % (ref 36.0–46.0)
Hemoglobin: 13.9 g/dL (ref 12.0–15.0)
Lymphocytes Relative: 33.5 % (ref 12.0–46.0)
Lymphs Abs: 1.8 K/uL (ref 0.7–4.0)
MCHC: 33.3 g/dL (ref 30.0–36.0)
MCV: 89.6 fl (ref 78.0–100.0)
Monocytes Absolute: 0.5 K/uL (ref 0.1–1.0)
Monocytes Relative: 8.6 % (ref 3.0–12.0)
Neutro Abs: 2.9 K/uL (ref 1.4–7.7)
Neutrophils Relative %: 56.2 % (ref 43.0–77.0)
Platelets: 247 K/uL (ref 150.0–400.0)
RBC: 4.68 Mil/uL (ref 3.87–5.11)
RDW: 13.7 % (ref 11.5–15.5)
WBC: 5.2 K/uL (ref 4.0–10.5)

## 2024-08-28 LAB — COMPREHENSIVE METABOLIC PANEL WITH GFR
ALT: 15 U/L (ref 0–35)
AST: 14 U/L (ref 0–37)
Albumin: 4.7 g/dL (ref 3.5–5.2)
Alkaline Phosphatase: 44 U/L (ref 39–117)
BUN: 22 mg/dL (ref 6–23)
CO2: 30 meq/L (ref 19–32)
Calcium: 10.1 mg/dL (ref 8.4–10.5)
Chloride: 104 meq/L (ref 96–112)
Creatinine, Ser: 0.74 mg/dL (ref 0.40–1.20)
GFR: 98.33 mL/min (ref >60.00)
Glucose, Bld: 84 mg/dL (ref 70–99)
Potassium: 4.5 meq/L (ref 3.5–5.1)
Sodium: 142 meq/L (ref 135–145)
Total Bilirubin: 0.4 mg/dL (ref 0.2–1.2)
Total Protein: 7.6 g/dL (ref 6.0–8.3)

## 2024-08-28 LAB — LIPID PANEL
Cholesterol: 167 mg/dL (ref 0–200)
HDL: 58.2 mg/dL (ref >39.00)
LDL Cholesterol: 85 mg/dL (ref 0–99)
NonHDL: 108.76
Total CHOL/HDL Ratio: 3
Triglycerides: 121 mg/dL (ref 0.0–149.0)
VLDL: 24.2 mg/dL (ref 0.0–40.0)

## 2024-08-28 NOTE — Telephone Encounter (Signed)
 Patient called to schedule an appointment with Dr. Ines. Patient at last visit [name], NP suggested need to schedule an appointment with Dr. Ines because been so long since you seen her.

## 2024-08-28 NOTE — Patient Instructions (Signed)
 It was great to see you!  Please go to the lab for blood work.   Our office will call you with your results unless you have chosen to receive results via MyChart.  If your blood work is normal we will follow-up each year for physicals and as scheduled for chronic medical problems.  If anything is abnormal we will treat accordingly and get you in for a follow-up.  Take care,  Lelon Mast

## 2024-08-28 NOTE — Progress Notes (Signed)
 Subjective:    Natalya K Van de Klashorst is a 43 y.o. female and is here for a comprehensive physical exam.  HPI  Health Maintenance Due  Topic Date Due   Influenza Vaccine  07/13/2024    Discussed the use of AI scribe software for clinical note transcription with the patient, who gave verbal consent to proceed.  History of Present Illness Lianah Peed sasha rogel is a 43 year old female who presents with stress-related weight gain and back pain.  Since transitioning to a full-time role in March 2025, she has experienced significant stress-related weight gain due to increased workload and stress levels. Stress eating and cessation of her exercise routine have contributed to this weight gain. Her diet has deteriorated due to time constraints, particularly with her daughter's volleyball schedule, and she has not maintained her morning yoga practice.  In June 2025, she experienced a herniated disc, leading to persistent back pain described as a 'giant knot' in her glute. She has undergone x-rays and physical therapy but continues to experience pain. Lying on her stomach on a yoga mat after work provides some relief, while minimal movement helps loosen the pain.  Her current lifestyle changes, including a new job and increased responsibilities, have impacted her ability to maintain a healthy diet and exercise routine. She is not taking any classes this semester due to scheduling conflicts but plans to resume in the spring.  Her sleep has been affected by her new schedule, requiring early waking, but she is not awake much at night anymore due to fatigue. She reports slow digestion.    Health Maintenance: Immunizations -- receiving flu shot today Colonoscopy -- n/a Mammogram -- UpToDate  PAP -- UpToDate  Bone Density -- normal Diet -- unhealthy due to stress/limited self care Exercise -- limited due to schedule  Sleep habits -- overall ok  Mood -- stable  UTD with dentist? -  yes UTD with eye doctor? - yes  Weight history: Wt Readings from Last 10 Encounters:  08/28/24 196 lb 6.1 oz (89.1 kg)  12/13/23 184 lb (83.5 kg)  05/23/23 195 lb (88.5 kg)  03/02/23 197 lb (89.4 kg)  02/09/23 193 lb (87.5 kg)  01/28/23 193 lb (87.5 kg)  11/10/22 199 lb 9.6 oz (90.5 kg)  10/18/22 198 lb (89.8 kg)  09/13/22 197 lb (89.4 kg)  09/10/22 197 lb 6.4 oz (89.5 kg)   Body mass index is 30.3 kg/m. Patient's last menstrual period was 01/15/2015 (exact date).  Alcohol use:  reports no history of alcohol use.  Tobacco use:  Tobacco Use: Low Risk  (08/28/2024)   Patient History    Smoking Tobacco Use: Never    Smokeless Tobacco Use: Never    Passive Exposure: Not on file   Eligible for lung cancer screening? yes     08/28/2024    8:41 AM  Depression screen PHQ 2/9  Decreased Interest 0  Down, Depressed, Hopeless 0  PHQ - 2 Score 0     Other providers/specialists: Patient Care Team: Job Lukes, [place] as PCP - General (Physician Assistant) Gynecology, [facility] as Consulting Physician (Obstetrics and Gynecology) [facility] as Consulting Physician (Neurology) Hershal Ozell PARAS, MD as Consulting Physician (Endocrinology)    PMHx, SurgHx, SocialHx, Medications, and Allergies were reviewed in the Visit Navigator and updated as appropriate.   Past Medical History:  Diagnosis Date   Allergy    COVID    Eczema    Fatigue  Hashimoto's disease    Hypothyroidism, acquired, autoimmune    Migraines    Thyroiditis, autoimmune    Hashimotos    Vaginal delivery 2006, 2009     Past Surgical History:  Procedure Laterality Date   BREAST BIOPSY Right 01/30/2024   US  RT BREAST BX W LOC DEV 1ST LESION IMG BX SPEC US  GUIDE 01/30/2024 [facility] MAMMOGRAPHY   BREAST SURGERY  2025   Biopsy   MOLE REMOVAL  12/13/2008   tongue growth  12/13/2008   WISDOM TOOTH EXTRACTION  12/13/1997     Family History  Problem Relation Age of  Onset   Stroke Mother    Lung cancer Mother        Small cell   COPD Mother    Diabetes Mellitus II Mother    Anxiety disorder Mother    Cancer Mother    Benign prostatic hyperplasia Father    Stroke Maternal Grandmother    Arthritis Maternal Grandmother    Varicose Veins Maternal Grandmother    Heart disease Maternal Grandmother    Atrial fibrillation Maternal Grandmother    Heart disease Maternal Grandfather    Hashimoto's thyroiditis Daughter        Hashimoto's 2   Diabetes Son    Hypothyroidism Son    Breast cancer Other    Colon cancer Other    Atrial fibrillation Maternal Uncle    Migraines Neg Hx     Social History   Tobacco Use   Smoking status: Never   Smokeless tobacco: Never  Vaping Use   Vaping status: Never Used  Substance Use Topics   Alcohol use: No   Drug use: No    Review of Systems:   Review of Systems  Constitutional:  Negative for chills, fever, malaise/fatigue and weight loss.  HENT:  Negative for hearing loss, sinus pain and sore throat.   Respiratory:  Negative for cough and hemoptysis.   Cardiovascular:  Negative for chest pain, palpitations, leg swelling and PND.  Gastrointestinal:  Negative for abdominal pain, constipation, diarrhea, heartburn, nausea and vomiting.  Genitourinary:  Negative for dysuria, frequency and urgency.  Musculoskeletal:  Negative for back pain, myalgias and neck pain.  Skin:  Negative for itching and rash.  Neurological:  Negative for dizziness, tingling, seizures and headaches.  Endo/Heme/Allergies:  Negative for polydipsia.  Psychiatric/Behavioral:  Negative for depression. The patient is not nervous/anxious.     Objective:   BP 128/70 (BP Location: Left Arm, Patient Position: Sitting, Cuff Size: Large)   Pulse 89   Temp 97.9 F (36.6 C) (Temporal)   Ht 5' 7.5 (1.715 m)   Wt 196 lb 6.1 oz (89.1 kg)   LMP 01/15/2015 (Exact Date) Comment: LMP 2016  SpO2 99%   BMI 30.30 kg/m  Body mass index is 30.3  kg/m.   General Appearance:    Alert, cooperative, no distress, appears stated age  Head:    Normocephalic, without obvious abnormality, atraumatic  Eyes:    PERRL, conjunctiva/corneas clear, EOM's intact, fundi    benign, both eyes  Ears:    Normal TM's and external ear canals, both ears  Nose:   Nares normal, septum midline, mucosa normal, no drainage    or sinus tenderness  Throat:   Lips, mucosa, and tongue normal; teeth and gums normal  Neck:   Supple, symmetrical, trachea midline, no adenopathy;    thyroid :  no enlargement/tenderness/nodules; no carotid   bruit or JVD  Back:     Symmetric, no curvature, ROM  normal, no CVA tenderness  Lungs:     Clear to auscultation bilaterally, respirations unlabored  Chest Wall:    No tenderness or deformity   Heart:    Regular rate and rhythm, S1 and S2 normal, no murmur, rub or gallop  Breast Exam:    Deferred  Abdomen:     Soft, non-tender, bowel sounds active all four quadrants,    no masses, no organomegaly  Genitalia:    Deferred   Extremities:   Extremities normal, atraumatic, no cyanosis or edema  Pulses:   2+ and symmetric all extremities  Skin:   Skin color, texture, turgor normal, no rashes or lesions  Lymph nodes:   Cervical, supraclavicular, and axillary nodes normal  Neurologic:   CNII-XII intact, normal strength, sensation and reflexes    throughout    Assessment/Plan:   Assessment and Plan Assessment & Plan General Health Maintenance Up to date with bone density, Pap smear, and mammogram. Approaching age for colon cancer screening. Due for skin cancer check due to history of precancerous lesion removal. - Discuss colon cancer screening options, including colonoscopy and Cologuard. Insurance may not cover Cologuard until age 72. - Refer to dermatology for skin cancer check.  Obesity Obesity exacerbated by lifestyle changes, stress, and decreased activity. Interested in diet and exercise improvement post-daughter's  volleyball season. Insurance does not cover weight loss medications. - Encourage healthy eating and regular physical activity. - Discuss interest in weight loss medications, noting insurance limitations.        Lucie Buttner, PA-C [facility]

## 2024-08-29 ENCOUNTER — Ambulatory Visit: Payer: Self-pay | Admitting: Physician Assistant

## 2024-09-04 DIAGNOSIS — F411 Generalized anxiety disorder: Secondary | ICD-10-CM | POA: Diagnosis not present

## 2024-09-07 ENCOUNTER — Other Ambulatory Visit (HOSPITAL_COMMUNITY): Payer: Self-pay

## 2024-09-07 ENCOUNTER — Other Ambulatory Visit: Payer: Self-pay

## 2024-10-02 DIAGNOSIS — F411 Generalized anxiety disorder: Secondary | ICD-10-CM | POA: Diagnosis not present

## 2024-10-17 ENCOUNTER — Other Ambulatory Visit: Payer: Self-pay

## 2024-10-17 ENCOUNTER — Encounter (HOSPITAL_COMMUNITY): Payer: Self-pay

## 2024-10-17 ENCOUNTER — Telehealth (HOSPITAL_COMMUNITY): Payer: Self-pay

## 2024-10-17 ENCOUNTER — Other Ambulatory Visit (HOSPITAL_COMMUNITY): Payer: Self-pay

## 2024-10-18 ENCOUNTER — Other Ambulatory Visit: Payer: Self-pay

## 2024-10-18 ENCOUNTER — Telehealth: Payer: Self-pay | Admitting: Adult Health

## 2024-10-18 NOTE — Telephone Encounter (Signed)
 Submitted auth renewal request via CMM, status is pending. Key: [employer]

## 2024-10-30 ENCOUNTER — Telehealth: Payer: Self-pay

## 2024-10-30 ENCOUNTER — Other Ambulatory Visit (HOSPITAL_COMMUNITY): Payer: Self-pay

## 2024-10-30 DIAGNOSIS — F411 Generalized anxiety disorder: Secondary | ICD-10-CM | POA: Diagnosis not present

## 2024-10-30 NOTE — Telephone Encounter (Signed)
 Auth was approved, she will continue to fill through [facility].  Auth#: [id number] (10/24/24-10/23/25)

## 2024-10-30 NOTE — Telephone Encounter (Signed)
 PA request has been Submitted. New Encounter has been or will be created for follow up. For additional info see Pharmacy Prior Auth telephone encounter from 10/30/2024.

## 2024-10-30 NOTE — Telephone Encounter (Signed)
 Pharmacy Patient Advocate Encounter   Received notification from [facility] that prior authorization for Aimovig  140mg /ml autoinjector is required/requested.   Insurance verification completed.   The patient is insured through [facility].   Per test claim: PA required; PA submitted to above mentioned insurance via Latent Key/confirmation #/EOC [id number] Status is pending

## 2024-10-31 ENCOUNTER — Other Ambulatory Visit (HOSPITAL_COMMUNITY): Payer: Self-pay

## 2024-10-31 NOTE — Telephone Encounter (Signed)
 Pharmacy Patient Advocate Encounter  Received notification from [employer] that Prior Authorization for AImovig  has been APPROVED from 10/30/2024 to 10/29/2025. Ran test claim, Copay is $57.94. This test claim was processed through [facility]- copay amounts may vary at other pharmacies due to pharmacy/plan contracts, or as the patient moves through the different stages of their insurance plan.   PA #/Case ID/Reference #: [id number]

## 2024-11-05 ENCOUNTER — Other Ambulatory Visit (HOSPITAL_COMMUNITY): Payer: Self-pay

## 2024-11-06 ENCOUNTER — Other Ambulatory Visit: Payer: Self-pay

## 2024-11-06 NOTE — Progress Notes (Signed)
 Specialty Pharmacy Refill Coordination Note  Nancy [name] is a 43 y.o. female assessed today regarding refills of clinic administered specialty medication(s) OnabotulinumtoxinA  (Botox )   Clinic requested Courier to Provider Office   Delivery date: 11/12/24   Verified address: [facility]  [address]  [place],  [place] [address]   Medication will be filled on: 11/09/24

## 2024-11-14 ENCOUNTER — Telehealth: Payer: Self-pay

## 2024-11-15 ENCOUNTER — Ambulatory Visit: Admitting: Adult Health

## 2024-11-15 VITALS — BP 138/73 | HR 86

## 2024-11-15 DIAGNOSIS — G43711 Chronic migraine without aura, intractable, with status migrainosus: Secondary | ICD-10-CM

## 2024-11-15 MED ORDER — ONABOTULINUMTOXINA 200 UNITS IJ SOLR
155.0000 [IU] | Freq: Once | INTRAMUSCULAR | Status: AC
  Administered 2024-11-15: 155 [IU] via INTRAMUSCULAR

## 2024-11-15 NOTE — Progress Notes (Signed)
 .  11/15/24:  Botox  continues to work well! Migraines are under good control. Gets approximately 4-5 migraines a month. Uses nurtec for abortive therapy. Remains on aimovig  monthly.  08/14/24: Reports that Botox  is working well for her.  She states on average she may get 4-5 migraines a month but these are manageable.  She typically take Nurtec and that allows her to still be able to function.  She is also on Aimovig .  Tends to get more migraines as she gets closer to being due for Botox .  05/16/24: no migraines in the last month. Had several in April. At the last visit she was started on t on nurtec and that has been beneficial. Remains on aimovig .   02/22/24: will get breakthrough headaches with weather changes. Having 3 migraines a month. Remains on Aimovig . When she gets a headaches she takes naproxen, Imitrex  and Zofran . Headache will resolve may resolve in 30 minutes or it could last the entire day.   11/21/23: Reports that Botox  is working well for her.  Remains on Aimovig  as well.  Returns today for injections  08/16/23: She remains on on Botox  and Aimovig .  States that this continues to work well for her.  Denies any new symptoms.  05/23/23: Patient reports that when she is able to take Qulipta  in the Botox  she does not have any migraines.  She states in the last 2 months she has not had any migraines.  She states typically weather would be a trigger for her.  We have had several thunderstorm that she did not develop a migraine.  Continues to use Imitrex  for abortive therapy  02/09/23: 4 migraines a month. Continues Imitrex  when she does have a headache.  11/08/22: Reports that Botox  continues to work well for her.  Weather continues to be a trigger for her migraines.  Remains on Trokendi  150 mg daily and uses Imitrex  for abortive therapy  08/05/2022: Botox  continues to work well for her.  She reports that if there is a bad storm she typically will get a migraine otherwise she has been doing  well  05/11/2022: botox  continues to work well. Trigger for migraines is typically weather related   BOTOX  PROCEDURE NOTE FOR MIGRAINE HEADACHE    Contraindications and precautions discussed with patient(above). Aseptic procedure was observed and patient tolerated procedure. Procedure performed by [name] Russell, NP  The condition has existed for more than 6 months, and pt does not have a diagnosis of ALS, Myasthenia Gravis or Lambert-Eaton Syndrome.  Risks and benefits of injections discussed and pt agrees to proceed with the procedure.  Written consent obtained  These injections are medically necessary. ]These injections do not cause sedations or hallucinations which the oral therapies may cause.  Indication/Diagnosis: chronic migraine BOTOX (G9414) injection was performed according to protocol by [employer]er[employer]. 200 units of BOTOX  was dissolved into 4 cc NS.        Lot: [id number] Expiration: 05/2026 NDC: 9976-6078-97   Bacteriostatic 0.9% Sodium Chloride - 4 mL  Lot: [id number] Expiration: 02/2026 NDC: 9590-8033-97   Dx: H56.288        Description of procedure:  The patient was placed in a sitting position. The standard protocol was used for Botox  as follows, with 5 units of Botox  injected at each site:   -Procerus muscle, midline injection  -Corrugator muscle, bilateral injection  -Frontalis muscle, bilateral injection, with 2 sites each side, medial injection was performed in the upper one third of the frontalis muscle, in the region vertical from the medial  inferior edge of the superior orbital rim. The lateral injection was again in the upper one third of the forehead vertically above the lateral limbus of the cornea, 1.5 cm lateral to the medial injection site.  -Temporalis muscle injection, 4 sites, bilaterally. The first injection was 3 cm above the tragus of the ear, second injection site was 1.5 cm to 3 cm up from the first injection site in line with the tragus of the ear.  The third injection site was 1.5-3 cm forward between the first 2 injection sites. The fourth injection site was 1.5 cm posterior to the second injection site.  -Occipitalis muscle injection, 3 sites, bilaterally. The first injection was done one half way between the occipital protuberance and the tip of the mastoid process behind the ear. The second injection site was done lateral and superior to the first, 1 fingerbreadth from the first injection. The third injection site was 1 fingerbreadth superiorly and medially from the first injection site.  -Cervical paraspinal muscle injection, 2 sites, bilateral knee first injection site was 1 cm from the midline of the cervical spine, 3 cm inferior to the lower border of the occipital protuberance. The second injection site was 1.5 cm superiorly and laterally to the first injection site.  -Trapezius muscle injection was performed at 3 sites, bilaterally. The first injection site was in the upper trapezius muscle halfway between the inflection point of the neck, and the acromion. The second injection site was one half way between the acromion and the first injection site. The third injection was done between the first injection site and the inflection point of the neck.   Will return for repeat injection in 3 months.   A 200 units of Botox  was used, 155 units were injected, the rest of the Botox  was wasted. The patient tolerated the procedure well, there were no complications of the above procedure.  [name] Russell, MSN, NP-C 11/15/2024, 8:03 AM [facility] [address] [place], [place] [address] [phone]

## 2024-11-15 NOTE — Progress Notes (Signed)
 Lot: [id number] Expiration: 05/2026 NDC: 9976-6078-97  Bacteriostatic 0.9% Sodium Chloride - 4 mL  Lot: [id number] Expiration: 02/2026 NDC: 9590-8033-97  Dx: G43.711 S/P  Witnessed by Diandra,CMA

## 2024-11-19 ENCOUNTER — Other Ambulatory Visit (HOSPITAL_COMMUNITY): Payer: Self-pay

## 2024-11-19 NOTE — Telephone Encounter (Signed)
 Pharmacy Patient Advocate Encounter  Received notification from [employer] that Prior Authorization for Nurtec has been APPROVED from 11/19/2024 to 11/18/2025. Unable to obtain price due to refill too soon rejection, last fill date 11/16/2024 next available fill date12/29/2025   PA #/Case ID/Reference #: [id number]

## 2024-11-19 NOTE — Telephone Encounter (Signed)
 Pharmacy Patient Advocate Encounter   Received notification from CoverMyMeds that prior authorization for Nurtec is required/requested.   Insurance verification completed.   The patient is insured through [facility].   Per test claim: PA required; PA submitted to above mentioned insurance via Latent Key/confirmation #/EOC [employer] Status is pending

## 2024-11-27 ENCOUNTER — Encounter: Payer: Self-pay | Admitting: Physician Assistant

## 2024-11-27 NOTE — Telephone Encounter (Signed)
 Pt requesting refill for Climara  0.05 mg patch, okay to fill? Historical provider.

## 2024-11-28 MED ORDER — ESTRADIOL 0.05 MG/24HR TD PTWK
0.0500 mg | MEDICATED_PATCH | TRANSDERMAL | 3 refills | Status: AC
  Filled 2024-12-07: qty 12, 84d supply, fill #0

## 2024-12-07 ENCOUNTER — Other Ambulatory Visit: Payer: Self-pay

## 2024-12-07 ENCOUNTER — Other Ambulatory Visit (HOSPITAL_COMMUNITY): Payer: Self-pay

## 2024-12-17 ENCOUNTER — Ambulatory Visit: Payer: BC Managed Care – PPO | Admitting: Nurse Practitioner

## 2024-12-17 ENCOUNTER — Other Ambulatory Visit: Payer: Self-pay | Admitting: Adult Health

## 2024-12-17 ENCOUNTER — Other Ambulatory Visit: Payer: Self-pay

## 2024-12-18 ENCOUNTER — Encounter (HOSPITAL_COMMUNITY): Payer: Self-pay

## 2024-12-18 ENCOUNTER — Other Ambulatory Visit: Payer: Self-pay

## 2024-12-18 ENCOUNTER — Ambulatory Visit: Admitting: Nurse Practitioner

## 2024-12-18 ENCOUNTER — Other Ambulatory Visit (HOSPITAL_COMMUNITY): Payer: Self-pay

## 2024-12-18 MED ORDER — AIMOVIG 140 MG/ML ~~LOC~~ SOAJ
1.0000 mL | SUBCUTANEOUS | 5 refills | Status: AC
  Filled 2024-12-18 – 2025-01-12 (×4): qty 1, 30d supply, fill #0

## 2024-12-19 ENCOUNTER — Other Ambulatory Visit (HOSPITAL_COMMUNITY): Payer: Self-pay

## 2024-12-24 ENCOUNTER — Other Ambulatory Visit: Payer: Self-pay | Admitting: Physician Assistant

## 2024-12-24 DIAGNOSIS — Z1231 Encounter for screening mammogram for malignant neoplasm of breast: Secondary | ICD-10-CM

## 2025-01-11 ENCOUNTER — Other Ambulatory Visit (HOSPITAL_COMMUNITY): Payer: Self-pay

## 2025-01-14 ENCOUNTER — Ambulatory Visit: Admitting: Neurology

## 2025-01-14 ENCOUNTER — Other Ambulatory Visit (HOSPITAL_COMMUNITY): Payer: Self-pay

## 2025-01-15 ENCOUNTER — Ambulatory Visit: Admission: RE | Admit: 2025-01-15 | Discharge: 2025-01-15 | Disposition: A | Source: Ambulatory Visit

## 2025-01-15 ENCOUNTER — Encounter: Payer: Self-pay | Admitting: Physician Assistant

## 2025-01-15 DIAGNOSIS — Z1231 Encounter for screening mammogram for malignant neoplasm of breast: Secondary | ICD-10-CM

## 2025-01-16 ENCOUNTER — Other Ambulatory Visit (HOSPITAL_COMMUNITY): Payer: Self-pay

## 2025-01-16 ENCOUNTER — Other Ambulatory Visit: Payer: Self-pay

## 2025-01-16 MED ORDER — PROGESTERONE MICRONIZED 100 MG PO CAPS
100.0000 mg | ORAL_CAPSULE | Freq: Every day | ORAL | 3 refills | Status: AC
  Filled 2025-01-16: qty 90, 90d supply, fill #0

## 2025-01-16 NOTE — Addendum Note (Signed)
 Addended by: THURMON ARLAND PARAS on: 01/16/2025 10:37 AM   Modules accepted: Orders

## 2025-01-17 ENCOUNTER — Other Ambulatory Visit: Payer: Self-pay

## 2025-01-17 ENCOUNTER — Other Ambulatory Visit (HOSPITAL_COMMUNITY): Payer: Self-pay

## 2025-01-17 MED ORDER — LEVOTHYROXINE SODIUM 175 MCG PO TABS
175.0000 ug | ORAL_TABLET | Freq: Every morning | ORAL | 3 refills | Status: AC
  Filled 2025-01-17: qty 90, 90d supply, fill #0

## 2025-02-01 ENCOUNTER — Ambulatory Visit: Admitting: Neurology

## 2025-02-07 ENCOUNTER — Ambulatory Visit: Admitting: Adult Health

## 2025-02-12 ENCOUNTER — Ambulatory Visit: Admitting: Neurology

## 2025-02-13 ENCOUNTER — Ambulatory Visit: Admitting: Adult Health

## 2025-02-19 ENCOUNTER — Ambulatory Visit: Admitting: Neurology

## 2025-04-01 ENCOUNTER — Ambulatory Visit: Admitting: Physician Assistant
